# Patient Record
Sex: Female | Born: 1980
Health system: Southern US, Community
[De-identification: ages and names within clinical notes are randomized; demographics above are authoritative.]

## PROBLEM LIST (undated history)

## (undated) DIAGNOSIS — C8400 Mycosis fungoides, unspecified site: Secondary | ICD-10-CM

## (undated) DIAGNOSIS — M069 Rheumatoid arthritis, unspecified: Secondary | ICD-10-CM

---

## 2016-04-05 ENCOUNTER — Encounter: Payer: Self-pay | Admitting: Family Medicine

## 2016-04-06 ENCOUNTER — Ambulatory Visit (INDEPENDENT_AMBULATORY_CARE_PROVIDER_SITE_OTHER): Payer: Self-pay | Admitting: Physician Assistant

## 2016-04-06 VITALS — BP 122/72 | HR 67 | Temp 97.4°F | Resp 17 | Ht 67.0 in | Wt 176.0 lb

## 2016-04-06 DIAGNOSIS — Z111 Encounter for screening for respiratory tuberculosis: Secondary | ICD-10-CM

## 2016-04-06 NOTE — Progress Notes (Signed)
  Tuberculosis Risk Questionnaire  1. Yes [place] Were you born outside the [place] in one of the following parts of the world: [place], [place], [place], [place] or [place]?    2. Yes [place] Have you traveled outside the [place] and lived for more than one month in one of the following parts of the world: [place], [place], [place], [place] or [place]?    3. No Do you have a compromised immune system such as from any of the following conditions:HIV/AIDS, organ or bone marrow transplantation, diabetes, immunosuppressive medicines (e.g. Prednisone, Remicaide), leukemia, lymphoma, cancer of the head or neck, gastrectomy or jejunal bypass, end-stage renal disease (on dialysis), or silicosis?     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    5. No Have you ever: injected illegal drugs, used crack cocaine, lived in a homeless shelter  or been in jail or prison?     6. Yes [place] Have you ever been exposed to anyone with infectious tuberculosis?    Tuberculosis Symptom Questionnaire  Do you currently have any of the following symptoms?  1. No Unexplained cough lasting more than 3 weeks?   2. No Unexplained fever lasting more than 3 weeks.   3. No Night Sweats (sweating that leaves the bedclothes and sheets wet)     4. No Shortness of Breath   5. No Chest Pain   6. No Unintentional weight loss    7. No Unexplained fatigue (very tired for no reason)

## 2016-04-06 NOTE — Patient Instructions (Signed)
     IF you received an x-ray today, you will receive an invoice from [facility]. Please contact [facility] at [phone] with questions or concerns regarding your invoice.   IF you received labwork today, you will receive an invoice from [facility]. Please contact [facility] at [phone] with questions or concerns regarding your invoice.   Our billing staff will not be able to assist you with questions regarding bills from these companies.  You will be contacted with the lab results as soon as they are available. The fastest way to get your results is to activate your My Chart account. Instructions are located on the last page of this paperwork. If you have not heard from us regarding the results in 2 weeks, please contact this office.     

## 2016-04-10 LAB — QUANTIFERON IN TUBE
QFT TB AG MINUS NIL VALUE: 0 IU/mL
QUANTIFERON MITOGEN VALUE: 7.11 [IU]/mL
QUANTIFERON NIL VALUE: 0.03 [IU]/mL
QUANTIFERON TB AG VALUE: 0.03 [IU]/mL
QUANTIFERON TB GOLD: NEGATIVE

## 2016-04-10 LAB — QUANTIFERON TB GOLD ASSAY (BLOOD)

## 2016-04-12 ENCOUNTER — Telehealth: Payer: Self-pay | Admitting: Physician Assistant

## 2016-04-12 ENCOUNTER — Emergency Department (HOSPITAL_COMMUNITY)
Admission: EM | Admit: 2016-04-12 | Discharge: 2016-04-13 | Disposition: A | Discharge status: Home / Self Care | Payer: Self-pay | Attending: Emergency Medicine | Admitting: Emergency Medicine

## 2016-04-12 DIAGNOSIS — H8110 Benign paroxysmal vertigo, unspecified ear: Secondary | ICD-10-CM | POA: Insufficient documentation

## 2016-04-12 LAB — CBC WITH DIFFERENTIAL/PLATELET
BASOS ABS: 0 10*3/uL (ref 0.0–0.1)
Basophils Relative: 0 %
EOS ABS: 0.1 10*3/uL (ref 0.0–0.7)
EOS PCT: 2 %
HCT: 36.4 % (ref 36.0–46.0)
Hemoglobin: 11.7 g/dL — ABNORMAL LOW (ref 12.0–15.0)
LYMPHS ABS: 1.7 10*3/uL (ref 0.7–4.0)
LYMPHS PCT: 38 %
MCH: 25.4 pg — AB (ref 26.0–34.0)
MCHC: 32.1 g/dL (ref 30.0–36.0)
MCV: 79.1 fL (ref 78.0–100.0)
MONO ABS: 0.4 10*3/uL (ref 0.1–1.0)
Monocytes Relative: 8 %
Neutro Abs: 2.4 10*3/uL (ref 1.7–7.7)
Neutrophils Relative %: 52 %
PLATELETS: 358 10*3/uL (ref 150–400)
RBC: 4.6 MIL/uL (ref 3.87–5.11)
RDW: 15.5 % (ref 11.5–15.5)
WBC: 4.6 10*3/uL (ref 4.0–10.5)

## 2016-04-12 LAB — COMPREHENSIVE METABOLIC PANEL
ALT: 15 U/L (ref 14–54)
AST: 22 U/L (ref 15–41)
Albumin: 4.3 g/dL (ref 3.5–5.0)
Alkaline Phosphatase: 49 U/L (ref 38–126)
Anion gap: 8 (ref 5–15)
BUN: 7 mg/dL (ref 6–20)
CHLORIDE: 108 mmol/L (ref 101–111)
CO2: 22 mmol/L (ref 22–32)
Calcium: 9.7 mg/dL (ref 8.9–10.3)
Creatinine, Ser: 0.55 mg/dL (ref 0.44–1.00)
Glucose, Bld: 88 mg/dL (ref 65–99)
POTASSIUM: 3.9 mmol/L (ref 3.5–5.1)
SODIUM: 138 mmol/L (ref 135–145)
Total Bilirubin: 0.4 mg/dL (ref 0.3–1.2)
Total Protein: 7.6 g/dL (ref 6.5–8.1)

## 2016-04-12 LAB — POC URINE PREG, ED: PREG TEST UR: NEGATIVE

## 2016-04-12 LAB — URINALYSIS, ROUTINE W REFLEX MICROSCOPIC
Bilirubin Urine: NEGATIVE
Glucose, UA: NEGATIVE mg/dL
Hgb urine dipstick: NEGATIVE
Ketones, ur: NEGATIVE mg/dL
LEUKOCYTES UA: NEGATIVE
NITRITE: NEGATIVE
Protein, ur: NEGATIVE mg/dL
SPECIFIC GRAVITY, URINE: 1.004 — AB (ref 1.005–1.030)
pH: 7 (ref 5.0–8.0)

## 2016-04-12 NOTE — Telephone Encounter (Signed)
Mychart message sent regarding results

## 2016-04-12 NOTE — ED Notes (Signed)
No answer in waiting area x 1

## 2016-04-12 NOTE — ED Triage Notes (Signed)
Pt c/o intermittent dizziness onset x 1 mth, pt reports unsteady gait, pt of headache, pt c/o bitterness in her mouth, reports no appetite, denies n/v/d, pt c/o some blurred vision when she feels dizzy, pt moved from [place] in January, pt A&O x4

## 2016-04-12 NOTE — Progress Notes (Signed)
PRIMARY CARE AT [place], [place] [address] [phone]  Date:  04/06/2016   Name:  Erin Clay   DOB:  1980/12/23   MRN:  [id number]  PCP:  No primary care provider on file.    History of Present Illness:  Erin Clay is a 36 y.o. female patient who presents to PCP with  Chief Complaint  Patient presents with  . 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  She must have a tb test.  Past bcg vaccination.     There are no active problems to display for this patient.   No past medical history on file.  No past surgical history on file.  Social History  Substance Use Topics  . Smoking status: Never Smoker  . Smokeless tobacco: Never Used  . Alcohol use No    No family history on file.  Not on File  Medication list has been reviewed and updated.  No current outpatient prescriptions on file prior to visit.   No current facility-administered medications on file prior to visit.     ROS ROS otherwise unremarkable unless listed above.  Physical Examination: BP 122/72   Pulse 67   Temp 97.4 F (36.3 C) (Oral)   Resp 17   Ht 5\' 7"  (1.702 m)   Wt 176 lb (79.8 kg)   LMP 03/26/2016 (Approximate)   SpO2 97%   BMI 27.57 kg/m  Ideal Body Weight: Weight in (lb) to have BMI = 25: 159.3  Physical Exam  Constitutional: She is oriented to person, place, and time. She appears well-developed and well-nourished. No distress.  HENT:  Head: Normocephalic and atraumatic.  Right Ear: External ear normal.  Left Ear: External ear normal.  Eyes: Conjunctivae and EOM are normal. Pupils are equal, round, and reactive to light.  Cardiovascular: Normal rate.   Pulmonary/Chest: Effort normal. No respiratory distress.  Neurological: She is alert and oriented to person, place, and time.  Skin: She is not diaphoretic.  Psychiatric: She has a normal mood and affect. Her behavior is normal.   Questionnaire  form attached.  Assessment and Plan: Erin Clay is a 36 y.o. female who is here today for tb test.   quantiferon obtianed today. Follow up pending result 1. Screening for tuberculosis - Quantiferon tb gold assay (blood)   Ivar Drape, PA-C [facility] 04/12/2016 9:48 AM

## 2016-04-13 ENCOUNTER — Encounter: Payer: Self-pay | Admitting: Physician Assistant

## 2016-04-13 MED ORDER — MECLIZINE HCL 32 MG PO TABS: 32.0000 mg | ORAL_TABLET | Freq: Three times a day (TID) | ORAL | 0 refills | Status: DC | PRN

## 2016-04-13 MED ORDER — MECLIZINE HCL 25 MG PO TABS
12.5000 mg | ORAL_TABLET | Freq: Once | ORAL | Status: AC
  Administered 2016-04-13: 12.5 mg via ORAL
  Filled 2016-04-13: qty 1

## 2016-04-13 NOTE — Discharge Instructions (Addendum)
Take medication as prescribed.   Make sure to stay hydrated and drink plenty of fluids.   Return to the ER if you have any worsening dizziness, persistent vomiting, changes in vision or headache that will not go away.

## 2016-04-13 NOTE — ED Notes (Signed)
Pt was found sleeping in waiting area.  Updated that she will be next to go back.

## 2016-04-13 NOTE — ED Notes (Signed)
Pt discharged by Gillermo Murdoch understanding of discharge instructions. NAD noted, abc intact. Understands medication and follow up instructions.

## 2016-04-13 NOTE — ED Provider Notes (Signed)
[facility] DEPT Provider Note   CSN: [id number] Arrival date & time: 04/12/16  1755     History   Chief Complaint Chief Complaint  Patient presents with  . Dizziness    HPI Erin Clay is a 36 y.o. female with who presents with 1 month of progressively worsening dizziness. She describes the dizziness as a room spinning sensation and states that it is worsened by positional movement and walking around. Her symptoms are improved when she rests and lays still. She has some intermittent nausea, headache and blurry vision associated with episodes of dizziness that are easily resolved when dizziness subsides. She has not tried any medications for symptoms. She denies recent falls or traumas. She does not smoke any cigarettes and denies any illicit drug use.   The history is provided by the patient.      No past medical history on file.  There are no active problems to display for this patient.   No past surgical history on file.  OB History    No data available       Home Medications    Prior to Admission medications   Medication Sig Start Date End Date Taking? Authorizing Provider  Multiple Vitamins-Minerals (MULTIVITAMIN WITH MINERALS) tablet Take 1 tablet by mouth daily.   Yes Historical Provider, MD  Omega-3 Fatty Acids (FISH OIL) 1000 MG CAPS Take 1 capsule by mouth.    Yes Historical Provider, MD  meclizine (ANTIVERT) 32 MG tablet Take 1 tablet (32 mg total) by mouth 3 (three) times daily as needed. 04/13/16   Orson Aloe, PA    Family History No family history on file.  Social History Social History  Substance Use Topics  . Smoking status: Never Smoker  . Smokeless tobacco: Never Used  . Alcohol use No     Allergies   Patient has no known allergies.   Review of Systems Review of Systems  Constitutional: Negative for appetite change, chills and fever.  HENT: Negative for congestion.   Eyes: Positive for visual disturbance.  Respiratory:  Negative for cough and shortness of breath.   Cardiovascular: Negative for chest pain and leg swelling.  Gastrointestinal: Positive for nausea. Negative for abdominal pain, diarrhea and vomiting.  Genitourinary: Positive for hematuria. Negative for dysuria.  Musculoskeletal: Negative for myalgias.  Neurological: Positive for dizziness and headaches. Negative for syncope and weakness.  All other systems reviewed and are negative.    Physical Exam Updated Vital Signs BP 118/68   Pulse 76   Temp 98.4 F (36.9 C) (Oral)   Resp (!) 22   Ht 5\' 7"  (1.702 m)   LMP 03/26/2016 (Approximate)   SpO2 100%   Physical Exam  Constitutional: She is oriented to person, place, and time. She appears well-developed and well-nourished.  HENT:  Head: Normocephalic and atraumatic.  Mouth/Throat: Oropharynx is clear and moist and mucous membranes are normal.  Eyes: Conjunctivae, EOM and lids are normal. Pupils are equal, round, and reactive to light.  Neck: Full passive range of motion without pain.  Cardiovascular: Normal rate, regular rhythm, normal heart sounds and normal pulses.  Exam reveals no gallop and no friction rub.   No murmur heard. Pulmonary/Chest: Effort normal and breath sounds normal.  Abdominal: Soft. Normal appearance. There is no tenderness. There is no rigidity and no guarding.  Musculoskeletal: Normal range of motion.  Neurological: She is alert and oriented to person, place, and time.  Cranial nerves III-XII intact Follows commands, Moves all extremities  5/5  strength to BUE and BLE  Sensation intact throughout  Normal finger to nose. No dysdiadochokinesia. No pronator drift. No slurred speech. No facial droop.   Skin: Skin is warm and dry. Capillary refill takes less than 2 seconds.  Psychiatric: She has a normal mood and affect. Her speech is normal.  Nursing note and vitals reviewed.    ED Treatments / Results  Labs (all labs ordered are listed, but only abnormal  results are displayed) Labs Reviewed  CBC WITH DIFFERENTIAL/PLATELET - Abnormal; Notable for the following:       Result Value   Hemoglobin 11.7 (*)    MCH 25.4 (*)    All other components within normal limits  URINALYSIS, ROUTINE W REFLEX MICROSCOPIC - Abnormal; Notable for the following:    Color, Urine STRAW (*)    Specific Gravity, Urine 1.004 (*)    All other components within normal limits  COMPREHENSIVE METABOLIC PANEL  POC URINE PREG, ED    EKG  EKG Interpretation None       Radiology No results found.  Procedures Procedures (including critical care time)  Medications Ordered in ED Medications  meclizine (ANTIVERT) tablet 12.5 mg (12.5 mg Oral Given 04/13/16 0119)     Initial Impression / Assessment and Plan / ED Course  I have reviewed the triage vital signs and the nursing notes.  Pertinent labs & imaging results that were available during my care of the patient were reviewed by me and considered in my medical decision making (see chart for details).     Consider BPPV vs anemia vs orthostatic hypotension vs electrolyte abnormality, though given history of symptoms, high suspicion for BPPV. Low suspicion for cranial pathology given lack of neural deficits on exam. Plan to check UA, Urine hcg, CMP, CBC. BP stable at this time. Patient offered anti-emetic but declined. Will give 25 mg meclizine and re-eval.   1:39 AM: Patient given meclizine. Denies any current dizziness or nausea. Will plan to re-check patient and have her ambulate in the ED. Will check orthostatics.   3:10 AM: Patient ambulated in the department without difficulty. Orthostatics within normal limits. Patient reports feeling improved. Will discharge home with meclizine for symptomatic relief. Discussed discharge plans with patient. She does not have any PCP follow-up so will provide her with numbers for clinic follow-up. Advised patient to return to the ED for any worsening dizziness, persistent  nausea/vomiting, loss of consciousness, vision changes or any other worsening or concerning symptoms. Patient expressed understanding and agreement to plan.     Final Clinical Impressions(s) / ED Diagnoses   Final diagnoses:  Benign paroxysmal positional vertigo, unspecified laterality    New Prescriptions Discharge Medication List as of 04/13/2016  3:11 AM    START taking these medications   Details  meclizine (ANTIVERT) 32 MG tablet Take 1 tablet (32 mg total) by mouth 3 (three) times daily as needed., Starting Sat 04/13/2016, Print         [place], [place] 97/67/34 1937    Delora Fuel, MD 90/24/09 [id number]

## 2016-04-13 NOTE — ED Notes (Signed)
Pt ambulated in hallway without difficulty

## 2017-09-25 ENCOUNTER — Other Ambulatory Visit: Payer: Self-pay

## 2017-09-25 ENCOUNTER — Emergency Department (HOSPITAL_COMMUNITY): Payer: No Typology Code available for payment source

## 2017-09-25 ENCOUNTER — Emergency Department (HOSPITAL_COMMUNITY)
Admission: EM | Admit: 2017-09-25 | Discharge: 2017-09-26 | Disposition: A | Discharge status: Home / Self Care | Payer: No Typology Code available for payment source | Attending: Emergency Medicine | Admitting: Emergency Medicine

## 2017-09-25 ENCOUNTER — Encounter (HOSPITAL_COMMUNITY): Payer: Self-pay | Admitting: Emergency Medicine

## 2017-09-25 DIAGNOSIS — M25531 Pain in right wrist: Secondary | ICD-10-CM | POA: Diagnosis not present

## 2017-09-25 DIAGNOSIS — Z79899 Other long term (current) drug therapy: Secondary | ICD-10-CM | POA: Diagnosis not present

## 2017-09-25 MED ORDER — IBUPROFEN 800 MG PO TABS
800.0000 mg | ORAL_TABLET | Freq: Once | ORAL | Status: AC
  Administered 2017-09-25: 800 mg via ORAL
  Filled 2017-09-25: qty 1

## 2017-09-25 NOTE — ED Triage Notes (Signed)
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  Pt is able to move it but w/ pain.

## 2017-09-25 NOTE — ED Provider Notes (Signed)
[facility] EMERGENCY DEPARTMENT Provider Note   CSN: [id number] Arrival date & time: 09/25/17  2049     History   Chief Complaint Chief Complaint  Patient presents with  . Arm Pain    HPI Erin Clay is a 37 y.o. female with a hx of no major medical problems presents to the Emergency Department complaining of acute, persistent right hand pain onset around 7 PM.  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  She reports this pain radiated to her right middle finger and up to her right elbow.  She reports she is been able to move her hand and wrist but this does elicit pain.  No treatments prior to arrival.  She denies numbness, tingling or weakness.  She denies previous injury to the hand or wrist.  She denies lacerations or open wounds.  Movement and palpation make her symptoms worse.  Nothing seems to make them better.   The history is provided by the patient and medical records. No language interpreter was used.    History reviewed. No pertinent past medical history.  There are no active problems to display for this patient.   History reviewed. No pertinent surgical history.   OB History   None      Home Medications    Prior to Admission medications   Medication Sig Start Date End Date Taking? Authorizing Provider  meclizine (ANTIVERT) 32 MG tablet Take 1 tablet (32 mg total) by mouth 3 (three) times daily as needed. 04/13/16   Volanda Napoleon, PA-C  Multiple Vitamins-Minerals (MULTIVITAMIN WITH MINERALS) tablet Take 1 tablet by mouth daily.    [provider]  Omega-3 Fatty Acids (FISH OIL) 1000 MG CAPS Take 1 capsule by mouth.     [provider]    Family History No family history on file.  Social History Social History   Tobacco Use  . Smoking status: Never Smoker  . Smokeless tobacco: Never Used  Substance Use Topics  . Alcohol use: No    . Drug use: No     Allergies   Patient has no known allergies.   Review of Systems Review of Systems  Constitutional: Negative for chills and fever.  Gastrointestinal: Negative for nausea and vomiting.  Musculoskeletal: Positive for arthralgias. Negative for back pain, joint swelling, neck pain and neck stiffness.  Skin: Negative for wound.  Neurological: Negative for numbness.  Hematological: Does not bruise/bleed easily.  Psychiatric/Behavioral: The patient is not nervous/anxious.   All other systems reviewed and are negative.    Physical Exam Updated Vital Signs BP 122/78 (BP Location: Right Arm)   Pulse 72   Temp 98.4 F (36.9 C) (Oral)   Resp 18   Ht 5\' 7"  (1.702 m)   Wt 83.5 kg   SpO2 100%   BMI 28.82 kg/m   Physical Exam  Constitutional: She appears well-developed and well-nourished. No distress.  HENT:  Head: Normocephalic and atraumatic.  Eyes: Conjunctivae are normal.  Neck: Normal range of motion.  Cardiovascular: Normal rate, regular rhythm and intact distal pulses.  Capillary refill < 3 sec  Pulmonary/Chest: Effort normal and breath sounds normal.  Musculoskeletal: She exhibits tenderness. She exhibits no edema.  Full range of motion of the right shoulder, elbow, wrist and all fingers of the right hand.  Tenderness to palpation over the dorsum of the hand and palmar side of her wrist.  No palpable deformity.  No open wounds.  Neurological: She is alert. Coordination normal.  Sensation intact to normal touch in the right upper extremity Strength 5/5 including flexion extension of all fingers, wrist and elbow.  5/5 supination and pronation  Skin: Skin is warm and dry. She is not diaphoretic.  No tenting of the skin  Psychiatric: She has a normal mood and affect.  Nursing note and vitals reviewed.    ED Treatments / Results   Radiology Dg Wrist Complete Right  Result Date: 09/25/2017 CLINICAL DATA:  Pain.  Hand series today. EXAM: RIGHT WRIST -  COMPLETE 3+ VIEW COMPARISON:  None. FINDINGS: There is no evidence of fracture or dislocation. There is no evidence of arthropathy or other focal bone abnormality. Soft tissues are unremarkable. IMPRESSION: Negative. Electronically Signed   By: Rolm Baptise M.D.   On: 09/25/2017 23:59   Dg Hand Complete Right  Result Date: 09/25/2017 CLINICAL DATA:  Pain EXAM: RIGHT HAND - COMPLETE 3+ VIEW COMPARISON:  Wrist series today FINDINGS: There is no evidence of fracture or dislocation. There is no evidence of arthropathy or other focal bone abnormality. Soft tissues are unremarkable. IMPRESSION: Negative. Electronically Signed   By: Rolm Baptise M.D.   On: 09/25/2017 23:58    Procedures Procedures (including critical care time)  Medications Ordered in ED Medications  ibuprofen (ADVIL,MOTRIN) tablet 800 mg (800 mg Oral Given 09/25/17 2334)     Initial Impression / Assessment and Plan / ED Course  I have reviewed the triage vital signs and the nursing notes.  Pertinent labs & imaging results that were available during my care of the patient were reviewed by me and considered in my medical decision making (see chart for details).     Patient X-Ray negative for obvious fracture or dislocation.  I personally evaluated these images.  Pain managed in ED. Pt advised to follow up with orthopedics if symptoms persist for possibility of missed fracture diagnosis. No specific tenderness over the scaphoid, but discussed with pt that occult fracture cannot be ruled out and the importance of close follow-up if pain persists greater than 1 week or is not improving. Patient given brace while in ED, conservative therapy recommended and discussed. Patient will be dc home & is agreeable with above plan.   Final Clinical Impressions(s) / ED Diagnoses   Final diagnoses:  Right wrist pain    ED Discharge Orders    None       Loris Winrow, Gwenlyn Perking 09/26/17 Cathe Mons, MD 09/26/17  [phone]

## 2017-09-26 NOTE — ED Notes (Signed)
No signaute pad available, RN reviewed discharge instructions w/ pt and family member.

## 2017-09-26 NOTE — Discharge Instructions (Addendum)
1. Medications: alternate naprosyn and tylenol for pain control, usual home medications 2. Treatment: rest, ice, elevate and use brace, drink plenty of fluids, gentle stretching 3. Follow Up: Please followup with orthopedics in 1 week if no improvement for discussion of your diagnoses and further evaluation after today's visit; if you do not have a primary care doctor use the resource guide provided to find one; Please return to the ER for worsening symptoms or other concerns

## 2018-03-09 ENCOUNTER — Emergency Department (HOSPITAL_COMMUNITY): Payer: BLUE CROSS/BLUE SHIELD

## 2018-03-09 ENCOUNTER — Emergency Department (HOSPITAL_COMMUNITY)
Admission: EM | Admit: 2018-03-09 | Discharge: 2018-03-09 | Disposition: A | Discharge status: Home / Self Care | Payer: BLUE CROSS/BLUE SHIELD | Attending: Emergency Medicine | Admitting: Emergency Medicine

## 2018-03-09 DIAGNOSIS — Z79899 Other long term (current) drug therapy: Secondary | ICD-10-CM | POA: Insufficient documentation

## 2018-03-09 DIAGNOSIS — R509 Fever, unspecified: Secondary | ICD-10-CM

## 2018-03-09 DIAGNOSIS — J111 Influenza due to unidentified influenza virus with other respiratory manifestations: Secondary | ICD-10-CM | POA: Diagnosis not present

## 2018-03-09 DIAGNOSIS — R945 Abnormal results of liver function studies: Secondary | ICD-10-CM | POA: Diagnosis not present

## 2018-03-09 DIAGNOSIS — R7989 Other specified abnormal findings of blood chemistry: Secondary | ICD-10-CM

## 2018-03-09 DIAGNOSIS — R69 Illness, unspecified: Secondary | ICD-10-CM

## 2018-03-09 LAB — URINALYSIS, ROUTINE W REFLEX MICROSCOPIC
BILIRUBIN URINE: NEGATIVE
Glucose, UA: NEGATIVE mg/dL
Hgb urine dipstick: NEGATIVE
Ketones, ur: 5 mg/dL — AB
Leukocytes, UA: NEGATIVE
Nitrite: NEGATIVE
Protein, ur: NEGATIVE mg/dL
Specific Gravity, Urine: 1.016 (ref 1.005–1.030)
pH: 5 (ref 5.0–8.0)

## 2018-03-09 LAB — CBC WITH DIFFERENTIAL/PLATELET
ABS IMMATURE GRANULOCYTES: 0.13 10*3/uL — AB (ref 0.00–0.07)
BASOS ABS: 0.1 10*3/uL (ref 0.0–0.1)
BASOS PCT: 1 %
EOS ABS: 0.3 10*3/uL (ref 0.0–0.5)
EOS PCT: 3 %
HCT: 36.5 % (ref 36.0–46.0)
Hemoglobin: 11.2 g/dL — ABNORMAL LOW (ref 12.0–15.0)
Immature Granulocytes: 2 %
Lymphocytes Relative: 40 %
Lymphs Abs: 3.5 10*3/uL (ref 0.7–4.0)
MCH: 25.2 pg — AB (ref 26.0–34.0)
MCHC: 30.7 g/dL (ref 30.0–36.0)
MCV: 82 fL (ref 80.0–100.0)
MONO ABS: 0.4 10*3/uL (ref 0.1–1.0)
Monocytes Relative: 5 %
NRBC: 0 % (ref 0.0–0.2)
Neutro Abs: 4.2 10*3/uL (ref 1.7–7.7)
Neutrophils Relative %: 49 %
PLATELETS: 239 10*3/uL (ref 150–400)
RBC: 4.45 MIL/uL (ref 3.87–5.11)
RDW: 14.6 % (ref 11.5–15.5)
WBC Morphology: >10
WBC: 8.6 10*3/uL (ref 4.0–10.5)

## 2018-03-09 LAB — COMPREHENSIVE METABOLIC PANEL
ALT: 116 U/L — AB (ref 0–44)
AST: 80 U/L — AB (ref 15–41)
Albumin: 3.4 g/dL — ABNORMAL LOW (ref 3.5–5.0)
Alkaline Phosphatase: 89 U/L (ref 38–126)
Anion gap: 15 (ref 5–15)
BILIRUBIN TOTAL: 1 mg/dL (ref 0.3–1.2)
CALCIUM: 8.4 mg/dL — AB (ref 8.9–10.3)
CHLORIDE: 99 mmol/L (ref 98–111)
CO2: 19 mmol/L — ABNORMAL LOW (ref 22–32)
CREATININE: 0.84 mg/dL (ref 0.44–1.00)
Glucose, Bld: 96 mg/dL (ref 70–99)
Potassium: 3.3 mmol/L — ABNORMAL LOW (ref 3.5–5.1)
Sodium: 133 mmol/L — ABNORMAL LOW (ref 135–145)
TOTAL PROTEIN: 7 g/dL (ref 6.5–8.1)

## 2018-03-09 LAB — INFLUENZA PANEL BY PCR (TYPE A & B)
INFLBPCR: NEGATIVE
Influenza A By PCR: NEGATIVE

## 2018-03-09 LAB — MONONUCLEOSIS SCREEN: Mono Screen: NEGATIVE

## 2018-03-09 LAB — GROUP A STREP BY PCR: Group A Strep by PCR: NOT DETECTED

## 2018-03-09 LAB — PREGNANCY, URINE: Preg Test, Ur: NEGATIVE

## 2018-03-09 LAB — LACTIC ACID, PLASMA: Lactic Acid, Venous: 1.6 mmol/L (ref 0.5–1.9)

## 2018-03-09 MED ORDER — SODIUM CHLORIDE 0.9 % IV BOLUS (SEPSIS)
1000.0000 mL | Freq: Once | INTRAVENOUS | Status: AC
  Administered 2018-03-09: 1000 mL via INTRAVENOUS

## 2018-03-09 MED ORDER — SODIUM CHLORIDE 0.9 % IV BOLUS (SEPSIS)
500.0000 mL | Freq: Once | INTRAVENOUS | Status: AC
  Administered 2018-03-09: 500 mL via INTRAVENOUS

## 2018-03-09 MED ORDER — SODIUM CHLORIDE 0.9 % IV BOLUS (SEPSIS)
1000.0000 mL | Freq: Once | INTRAVENOUS | Status: AC
  Administered 2018-03-09 (×2): 1000 mL via INTRAVENOUS

## 2018-03-09 MED ORDER — AMOXICILLIN-POT CLAVULANATE 875-125 MG PO TABS: 1.0000 | ORAL_TABLET | Freq: Two times a day (BID) | ORAL | 0 refills | Status: DC

## 2018-03-09 MED ORDER — ACETAMINOPHEN 325 MG PO TABS
650.0000 mg | ORAL_TABLET | Freq: Once | ORAL | Status: AC | PRN
  Administered 2018-03-09: 650 mg via ORAL
  Filled 2018-03-09: qty 2

## 2018-03-09 MED ORDER — DIPHENHYDRAMINE HCL 50 MG/ML IJ SOLN
12.5000 mg | Freq: Once | INTRAMUSCULAR | Status: AC
  Administered 2018-03-09: 12.5 mg via INTRAVENOUS
  Filled 2018-03-09: qty 1

## 2018-03-09 MED ORDER — IBUPROFEN 400 MG PO TABS
600.0000 mg | ORAL_TABLET | Freq: Once | ORAL | Status: AC
  Administered 2018-03-09: 600 mg via ORAL
  Filled 2018-03-09: qty 1

## 2018-03-09 NOTE — ED Notes (Signed)
Pt. returned from XR. 

## 2018-03-09 NOTE — ED Provider Notes (Signed)
[facility] EMERGENCY DEPARTMENT Provider Note   CSN: [id number] Arrival date & time: 03/09/18  8251     History   Chief Complaint Chief Complaint  Patient presents with  . Fever  . Lymphadenopathy    HPI Erin Clay is a 38 y.o. female.  She is complaining of feeling sick for 1 week.  She feels that her face is swollen and she has tender lymph nodes on her neck.  She has had a lack of appetite.  She is felt hot and cold.  She said she went to urgent care and they did not find anything.  Today in triage she was febrile to 103.  No sore throat no cough no earache no urinary symptoms no vaginal bleeding or discharge no diarrhea.  She was last in [place] 4 months ago.  No sick contacts or recent travel.  The history is provided by the patient.  Fever  Max temp prior to arrival:  103 Temp source:  Oral Severity:  Severe Onset quality:  Gradual Duration:  1 week Timing:  Intermittent Progression:  Unchanged Chronicity:  New Relieved by:  None tried Worsened by:  Nothing Ineffective treatments:  Acetaminophen Associated symptoms: chills and nausea   Associated symptoms: no chest pain, no confusion, no congestion, no cough, no diarrhea, no dysuria, no ear pain, no rash, no rhinorrhea, no sore throat and no vomiting   Risk factors: immunosuppression   Risk factors: no recent travel and no sick contacts     No past medical history on file.  There are no active problems to display for this patient.   No past surgical history on file.   OB History   No obstetric history on file.      Home Medications    Prior to Admission medications   Medication Sig Start Date End Date Taking? Authorizing Provider  meclizine (ANTIVERT) 32 MG tablet Take 1 tablet (32 mg total) by mouth 3 (three) times daily as needed. 04/13/16   Volanda Napoleon, PA-C  Multiple Vitamins-Minerals (MULTIVITAMIN WITH MINERALS) tablet Take 1 tablet by mouth daily.    [provider]  Omega-3 Fatty Acids (FISH OIL) 1000 MG CAPS Take 1 capsule by mouth.     [provider]    Family History No family history on file.  Social History Social History   Tobacco Use  . Smoking status: Never Smoker  . Smokeless tobacco: Never Used  Substance Use Topics  . Alcohol use: No  . Drug use: No     Allergies   Patient has no known allergies.   Review of Systems Review of Systems  Constitutional: Positive for appetite change, chills, fatigue and fever.  HENT: Negative for congestion, ear pain, rhinorrhea and sore throat.   Eyes: Negative for redness and visual disturbance.  Respiratory: Negative for cough.   Cardiovascular: Negative for chest pain.  Gastrointestinal: Positive for nausea. Negative for abdominal pain, diarrhea and vomiting.  Genitourinary: Negative for dysuria.  Musculoskeletal: Negative for back pain.  Skin: Negative for rash.  Neurological: Positive for dizziness and light-headedness.  Psychiatric/Behavioral: Negative for confusion.     Physical Exam Updated Vital Signs BP 98/83 (BP Location: Right Arm)   Pulse (!) 115   Temp (!) 103 F (39.4 C) (Oral)   Resp 16   SpO2 99%   Physical Exam Vitals signs and nursing note reviewed.  Constitutional:      General: She is not in acute distress.  Appearance: She is well-developed.  HENT:     Head: Normocephalic and atraumatic.     Right Ear: Tympanic membrane normal.     Left Ear: Tympanic membrane normal.     Mouth/Throat:     Mouth: Mucous membranes are moist.  Eyes:     General: No scleral icterus.    Conjunctiva/sclera: Conjunctivae normal.  Neck:     Musculoskeletal: Neck supple. No neck rigidity.  Cardiovascular:     Rate and Rhythm: Regular rhythm. Tachycardia present.     Heart sounds: No murmur.  Pulmonary:     Effort: Pulmonary effort is normal. No respiratory distress.     Breath sounds: Normal breath sounds.  Abdominal:     Palpations: Abdomen is soft.      Tenderness: There is no abdominal tenderness.  Musculoskeletal: Normal range of motion.        General: No tenderness or signs of injury.     Right lower leg: No edema.     Left lower leg: No edema.  Lymphadenopathy:     Cervical: Cervical adenopathy present.  Skin:    General: Skin is warm and dry.     Capillary Refill: Capillary refill takes less than 2 seconds.  Neurological:     General: No focal deficit present.     Mental Status: She is alert.      ED Treatments / Results  Labs (all labs ordered are listed, but only abnormal results are displayed) Labs Reviewed  COMPREHENSIVE METABOLIC PANEL - Abnormal; Notable for the following components:      Result Value   Sodium 133 (*)    Potassium 3.3 (*)    CO2 19 (*)    BUN <5 (*)    Calcium 8.4 (*)    Albumin 3.4 (*)    AST 80 (*)    ALT 116 (*)    All other components within normal limits  CBC WITH DIFFERENTIAL/PLATELET - Abnormal; Notable for the following components:   Hemoglobin 11.2 (*)    MCH 25.2 (*)    Abs Immature Granulocytes 0.13 (*)    All other components within normal limits  URINALYSIS, ROUTINE W REFLEX MICROSCOPIC - Abnormal; Notable for the following components:   Color, Urine AMBER (*)    APPearance CLOUDY (*)    Ketones, ur 5 (*)    All other components within normal limits  GROUP A STREP BY PCR  CULTURE, BLOOD (ROUTINE X 2)  CULTURE, BLOOD (ROUTINE X 2)  PREGNANCY, URINE  LACTIC ACID, PLASMA  INFLUENZA PANEL BY PCR (TYPE A & B)  MONONUCLEOSIS SCREEN  HEPATITIS PANEL, ACUTE    EKG None  Radiology Dg Chest 2 View  Result Date: 03/09/2018 CLINICAL DATA:  Fever EXAM: CHEST - 2 VIEW COMPARISON:  None. FINDINGS: Lungs are clear. Heart size and pulmonary vascularity are normal. No adenopathy. No bone lesions. IMPRESSION: No edema or consolidation. Electronically Signed   By: Lowella Grip III M.D.   On: 03/09/2018 14:02   US Abdomen Limited Ruq  Result Date: 03/09/2018 CLINICAL DATA:   Elevated LFTs EXAM: ULTRASOUND ABDOMEN LIMITED RIGHT UPPER QUADRANT COMPARISON:  None. FINDINGS: Gallbladder: There is some sludge in the gallbladder with no stones, wall thickening, or pericholecystic fluid. No Murphy's sign. Common bile duct: Diameter: 3.2 mm Liver: No focal lesion identified. Within normal limits in parenchymal echogenicity. Portal vein is patent on color Doppler imaging with normal direction of blood flow towards the liver. IMPRESSION: Mild sludge in the gallbladder without  wall thickening, pericholecystic fluid, or Murphy's sign. No other abnormalities. Electronically Signed   By: Dorise Bullion III M.D   On: 03/09/2018 10:51    Procedures Procedures (including critical care time)  Medications Ordered in ED Medications  sodium chloride 0.9 % bolus 1,000 mL (has no administration in time range)    And  sodium chloride 0.9 % bolus 1,000 mL (has no administration in time range)    And  sodium chloride 0.9 % bolus 500 mL (has no administration in time range)  ibuprofen (ADVIL,MOTRIN) tablet 600 mg (has no administration in time range)  acetaminophen (TYLENOL) tablet 650 mg (650 mg Oral Given 03/09/18 0736)     Initial Impression / Assessment and Plan / ED Course  I have reviewed the triage vital signs and the nursing notes.  Pertinent labs & imaging results that were available during my care of the patient were reviewed by me and considered in my medical decision making (see chart for details).  Clinical Course as of Mar 10 1719  Mon Mar 09, 2018  1120 Canceled the code sepsis.  Normal lactate and white blood cell count.  She is got a mild bump in her AST and ALT but normal alk phos and bili.  Flu testing negative.  Right upper quadrant ultrasound shows some sludge but no signs of acute cholecystitis. Likely lft bump from fever or possible hepatitis, ordered hep panel.    [MB]  1143 Reevaluated patient.  She said she is feeling a little bit better and her tachycardia is  improving.  She is defervesced.   [MB]  1330 Patient clinically feeling better as tolerated p.o. has defervesced.  He has no abdominal pain.  She has a primary care doctor so I recommended that she follow-up with them soon for repeat LFTs.  She understands to return if any worsening symptoms.  I feel this is most likely viral but she does have some adenopathy so maybe cover her for an upper infection with some amoxicillin.  He has surveillance blood cultures pending.   [MB]    Clinical Course User Index [MB] Hayden Rasmussen, MD      Final Clinical Impressions(s) / ED Diagnoses   Final diagnoses:  LFT elevation  Fever, unspecified fever cause  Influenza-like illness    ED Discharge Orders         Ordered    amoxicillin-clavulanate (AUGMENTIN) 875-125 MG tablet  Every 12 hours     03/09/18 1332           Hayden Rasmussen, MD 03/09/18 1722

## 2018-03-09 NOTE — ED Notes (Signed)
Patient transported to X-ray 

## 2018-03-09 NOTE — Discharge Instructions (Addendum)
You were seen in the emergency department for high fever and not feeling well.  You had blood work urinalysis and an ultrasound that did not show an obvious explanation for your symptoms.  You improved with lowering your fever and giving you plenty of IV fluids.  You will need to follow-up with your doctor for recheck of your liver numbers along with seeing if there is any improvement in your symptoms.  We have called you in a prescription for an antibiotic for possible upper respiratory infection symptoms.  Please return if any worsening symptoms.

## 2018-03-09 NOTE — ED Triage Notes (Signed)
Pt arrives to ED with c.o of swollen lymph nodes in left with fever and chills for the last 1 week- pt reports no appetite.

## 2018-03-09 NOTE — ED Notes (Signed)
Back from US.

## 2018-03-09 NOTE — ED Notes (Signed)
To US

## 2018-03-09 NOTE — ED Notes (Signed)
Patient verbalizes understanding of discharge instructions. Opportunity for questioning and answers were provided. Armband removed by staff, pt discharged from ED. Pt ambulatory to lobby.  

## 2018-03-10 LAB — HEPATITIS PANEL, ACUTE
HCV Ab: <0.1 s/co ratio (ref 0.0–0.9)
HEP A IGM: NEGATIVE
Hep B C IgM: NEGATIVE
Hepatitis B Surface Ag: NEGATIVE

## 2018-03-12 ENCOUNTER — Emergency Department (HOSPITAL_COMMUNITY): Payer: BLUE CROSS/BLUE SHIELD

## 2018-03-12 ENCOUNTER — Other Ambulatory Visit: Payer: Self-pay

## 2018-03-12 ENCOUNTER — Inpatient Hospital Stay (HOSPITAL_COMMUNITY)
Admission: EM | Admit: 2018-03-12 | Discharge: 2018-03-19 | DRG: 854 | Disposition: A | Discharge status: Home / Self Care | Payer: BLUE CROSS/BLUE SHIELD | Attending: Family Medicine | Admitting: Family Medicine

## 2018-03-12 ENCOUNTER — Encounter (HOSPITAL_COMMUNITY): Payer: Self-pay | Admitting: Emergency Medicine

## 2018-03-12 DIAGNOSIS — M069 Rheumatoid arthritis, unspecified: Secondary | ICD-10-CM | POA: Diagnosis present

## 2018-03-12 DIAGNOSIS — R590 Localized enlarged lymph nodes: Secondary | ICD-10-CM | POA: Diagnosis present

## 2018-03-12 DIAGNOSIS — Z791 Long term (current) use of non-steroidal anti-inflammatories (NSAID): Secondary | ICD-10-CM

## 2018-03-12 DIAGNOSIS — F419 Anxiety disorder, unspecified: Secondary | ICD-10-CM | POA: Diagnosis present

## 2018-03-12 DIAGNOSIS — J9811 Atelectasis: Secondary | ICD-10-CM | POA: Diagnosis present

## 2018-03-12 DIAGNOSIS — D649 Anemia, unspecified: Secondary | ICD-10-CM | POA: Diagnosis present

## 2018-03-12 DIAGNOSIS — R6510 Systemic inflammatory response syndrome (SIRS) of non-infectious origin without acute organ dysfunction: Secondary | ICD-10-CM | POA: Diagnosis present

## 2018-03-12 DIAGNOSIS — R000 Tachycardia, unspecified: Secondary | ICD-10-CM | POA: Diagnosis present

## 2018-03-12 DIAGNOSIS — E876 Hypokalemia: Secondary | ICD-10-CM | POA: Diagnosis present

## 2018-03-12 DIAGNOSIS — B379 Candidiasis, unspecified: Secondary | ICD-10-CM | POA: Diagnosis present

## 2018-03-12 DIAGNOSIS — I959 Hypotension, unspecified: Secondary | ICD-10-CM | POA: Diagnosis present

## 2018-03-12 DIAGNOSIS — L299 Pruritus, unspecified: Secondary | ICD-10-CM | POA: Diagnosis present

## 2018-03-12 DIAGNOSIS — D638 Anemia in other chronic diseases classified elsewhere: Secondary | ICD-10-CM | POA: Diagnosis present

## 2018-03-12 DIAGNOSIS — R748 Abnormal levels of other serum enzymes: Secondary | ICD-10-CM | POA: Diagnosis present

## 2018-03-12 DIAGNOSIS — R740 Nonspecific elevation of levels of transaminase and lactic acid dehydrogenase [LDH]: Secondary | ICD-10-CM | POA: Diagnosis present

## 2018-03-12 DIAGNOSIS — Z79899 Other long term (current) drug therapy: Secondary | ICD-10-CM

## 2018-03-12 DIAGNOSIS — K828 Other specified diseases of gallbladder: Secondary | ICD-10-CM | POA: Diagnosis present

## 2018-03-12 DIAGNOSIS — E872 Acidosis, unspecified: Secondary | ICD-10-CM

## 2018-03-12 DIAGNOSIS — R509 Fever, unspecified: Principal | ICD-10-CM | POA: Diagnosis present

## 2018-03-12 DIAGNOSIS — R0902 Hypoxemia: Secondary | ICD-10-CM | POA: Diagnosis present

## 2018-03-12 DIAGNOSIS — Z7952 Long term (current) use of systemic steroids: Secondary | ICD-10-CM

## 2018-03-12 LAB — CBC WITH DIFFERENTIAL/PLATELET
Abs Immature Granulocytes: 0.17 10*3/uL — ABNORMAL HIGH (ref 0.00–0.07)
BASOS PCT: 1 %
Basophils Absolute: 0.1 10*3/uL (ref 0.0–0.1)
Eosinophils Absolute: 0.4 10*3/uL (ref 0.0–0.5)
Eosinophils Relative: 4 %
HCT: 36.9 % (ref 36.0–46.0)
Hemoglobin: 11.4 g/dL — ABNORMAL LOW (ref 12.0–15.0)
Immature Granulocytes: 2 %
Lymphocytes Relative: 40 %
Lymphs Abs: 4.4 10*3/uL — ABNORMAL HIGH (ref 0.7–4.0)
MCH: 26.3 pg (ref 26.0–34.0)
MCHC: 30.9 g/dL (ref 30.0–36.0)
MCV: 85 fL (ref 80.0–100.0)
Monocytes Absolute: 1.2 10*3/uL — ABNORMAL HIGH (ref 0.1–1.0)
Monocytes Relative: 11 %
Neutro Abs: 4.6 10*3/uL (ref 1.7–7.7)
Neutrophils Relative %: 42 %
Platelets: 245 10*3/uL (ref 150–400)
RBC: 4.34 MIL/uL (ref 3.87–5.11)
RDW: 15.6 % — AB (ref 11.5–15.5)
WBC: 10.9 10*3/uL — ABNORMAL HIGH (ref 4.0–10.5)
nRBC: 0.2 % (ref 0.0–0.2)

## 2018-03-12 LAB — URINALYSIS, ROUTINE W REFLEX MICROSCOPIC
BILIRUBIN URINE: NEGATIVE
Glucose, UA: NEGATIVE mg/dL
Hgb urine dipstick: NEGATIVE
Ketones, ur: 20 mg/dL — AB
Nitrite: NEGATIVE
Protein, ur: NEGATIVE mg/dL
Specific Gravity, Urine: 1.006 (ref 1.005–1.030)
pH: 6 (ref 5.0–8.0)

## 2018-03-12 LAB — COMPREHENSIVE METABOLIC PANEL
ALT: 226 U/L — ABNORMAL HIGH (ref 0–44)
AST: 157 U/L — ABNORMAL HIGH (ref 15–41)
Albumin: 3.4 g/dL — ABNORMAL LOW (ref 3.5–5.0)
Alkaline Phosphatase: 150 U/L — ABNORMAL HIGH (ref 38–126)
Anion gap: 10 (ref 5–15)
BUN: <5 mg/dL — ABNORMAL LOW (ref 6–20)
CO2: 23 mmol/L (ref 22–32)
Calcium: 8.5 mg/dL — ABNORMAL LOW (ref 8.9–10.3)
Chloride: 103 mmol/L (ref 98–111)
Creatinine, Ser: 0.6 mg/dL (ref 0.44–1.00)
GFR calc non Af Amer: >60 mL/min (ref >60)
Glucose, Bld: 90 mg/dL (ref 70–99)
Potassium: 2.9 mmol/L — ABNORMAL LOW (ref 3.5–5.1)
Sodium: 136 mmol/L (ref 135–145)
TOTAL PROTEIN: 6.8 g/dL (ref 6.5–8.1)
Total Bilirubin: 1 mg/dL (ref 0.3–1.2)

## 2018-03-12 LAB — CBG MONITORING, ED: Glucose-Capillary: 86 mg/dL (ref 70–99)

## 2018-03-12 LAB — C-REACTIVE PROTEIN: CRP: 7.6 mg/dL — ABNORMAL HIGH (ref <1.0)

## 2018-03-12 LAB — LACTIC ACID, PLASMA
Lactic Acid, Venous: 2 mmol/L (ref 0.5–1.9)
Lactic Acid, Venous: 2.1 mmol/L (ref 0.5–1.9)

## 2018-03-12 LAB — TSH: TSH: 1.964 u[IU]/mL (ref 0.350–4.500)

## 2018-03-12 LAB — SEDIMENTATION RATE: SED RATE: 12 mm/h (ref 0–22)

## 2018-03-12 LAB — I-STAT BETA HCG BLOOD, ED (MC, WL, AP ONLY): I-stat hCG, quantitative: <5 m[IU]/mL (ref <5)

## 2018-03-12 LAB — LACTATE DEHYDROGENASE: LDH: 702 U/L — ABNORMAL HIGH (ref 98–192)

## 2018-03-12 LAB — MAGNESIUM: Magnesium: 2.3 mg/dL (ref 1.7–2.4)

## 2018-03-12 MED ORDER — ACETAMINOPHEN 325 MG PO TABS
650.0000 mg | ORAL_TABLET | Freq: Four times a day (QID) | ORAL | Status: DC | PRN
  Administered 2018-03-14 – 2018-03-17 (×9): 650 mg via ORAL
  Filled 2018-03-12 (×10): qty 2

## 2018-03-12 MED ORDER — POTASSIUM CHLORIDE CRYS ER 20 MEQ PO TBCR
40.0000 meq | EXTENDED_RELEASE_TABLET | Freq: Once | ORAL | Status: AC
  Administered 2018-03-12: 40 meq via ORAL
  Filled 2018-03-12: qty 2

## 2018-03-12 MED ORDER — LACTATED RINGERS IV BOLUS
1000.0000 mL | Freq: Once | INTRAVENOUS | Status: AC
  Administered 2018-03-12: 1000 mL via INTRAVENOUS

## 2018-03-12 MED ORDER — SULFASALAZINE 500 MG PO TBEC
500.0000 mg | DELAYED_RELEASE_TABLET | Freq: Two times a day (BID) | ORAL | Status: DC
  Administered 2018-03-12 – 2018-03-19 (×14): 500 mg via ORAL
  Filled 2018-03-12 (×14): qty 1

## 2018-03-12 MED ORDER — LACTATED RINGERS IV BOLUS
500.0000 mL | Freq: Once | INTRAVENOUS | Status: AC
  Administered 2018-03-12: 500 mL via INTRAVENOUS

## 2018-03-12 MED ORDER — IOPAMIDOL (ISOVUE-370) INJECTION 76%
INTRAVENOUS | Status: AC
  Filled 2018-03-12: qty 100

## 2018-03-12 MED ORDER — POTASSIUM CHLORIDE 10 MEQ/100ML IV SOLN
10.0000 meq | Freq: Once | INTRAVENOUS | Status: AC
  Administered 2018-03-12: 10 meq via INTRAVENOUS
  Filled 2018-03-12: qty 100

## 2018-03-12 MED ORDER — ALPRAZOLAM 0.5 MG PO TABS
0.5000 mg | ORAL_TABLET | Freq: Once | ORAL | Status: AC
  Administered 2018-03-12: 0.5 mg via ORAL
  Filled 2018-03-12: qty 1

## 2018-03-12 MED ORDER — IBUPROFEN 200 MG PO TABS
400.0000 mg | ORAL_TABLET | Freq: Four times a day (QID) | ORAL | Status: DC | PRN
  Administered 2018-03-12: 400 mg via ORAL
  Filled 2018-03-12: qty 2

## 2018-03-12 MED ORDER — IOPAMIDOL (ISOVUE-370) INJECTION 76%
100.0000 mL | Freq: Once | INTRAVENOUS | Status: AC | PRN
  Administered 2018-03-12: 100 mL via INTRAVENOUS

## 2018-03-12 MED ORDER — KETOROLAC TROMETHAMINE 30 MG/ML IJ SOLN: 30.0000 mg | Freq: Once | INTRAMUSCULAR | Status: DC

## 2018-03-12 MED ORDER — ACETAMINOPHEN 650 MG RE SUPP: 650.0000 mg | Freq: Four times a day (QID) | RECTAL | Status: DC | PRN

## 2018-03-12 MED ORDER — LACTATED RINGERS IV SOLN
INTRAVENOUS | Status: DC
  Administered 2018-03-12 – 2018-03-14 (×2): via INTRAVENOUS

## 2018-03-12 MED ORDER — HYDROXYCHLOROQUINE SULFATE 200 MG PO TABS
400.0000 mg | ORAL_TABLET | Freq: Every day | ORAL | Status: DC
  Administered 2018-03-12 – 2018-03-19 (×8): 400 mg via ORAL
  Filled 2018-03-12 (×8): qty 2

## 2018-03-12 MED ORDER — SODIUM CHLORIDE (PF) 0.9 % IJ SOLN
INTRAMUSCULAR | Status: AC
  Administered 2018-03-12: 3 mL via INTRAVENOUS
  Filled 2018-03-12: qty 50

## 2018-03-12 MED ORDER — METHYLPREDNISOLONE SODIUM SUCC 125 MG IJ SOLR
60.0000 mg | Freq: Once | INTRAMUSCULAR | Status: AC
  Administered 2018-03-12: 60 mg via INTRAVENOUS
  Filled 2018-03-12: qty 2

## 2018-03-12 MED ORDER — KETOROLAC TROMETHAMINE 30 MG/ML IJ SOLN
30.0000 mg | Freq: Once | INTRAMUSCULAR | Status: AC | PRN
  Administered 2018-03-14: 30 mg via INTRAVENOUS
  Filled 2018-03-12: qty 1

## 2018-03-12 MED ORDER — IBUPROFEN 200 MG PO TABS
600.0000 mg | ORAL_TABLET | Freq: Once | ORAL | Status: AC
  Administered 2018-03-12: 600 mg via ORAL
  Filled 2018-03-12: qty 3

## 2018-03-12 MED ORDER — PREDNISONE 5 MG PO TABS
5.0000 mg | ORAL_TABLET | Freq: Every day | ORAL | Status: DC
  Administered 2018-03-12 – 2018-03-19 (×8): 5 mg via ORAL
  Filled 2018-03-12 (×8): qty 1

## 2018-03-12 MED ORDER — SODIUM CHLORIDE 0.9% FLUSH
3.0000 mL | Freq: Once | INTRAVENOUS | Status: AC
  Administered 2018-03-12 (×2): 3 mL via INTRAVENOUS

## 2018-03-12 NOTE — Significant Event (Addendum)
Rapid Response Event Note  Overview: Time Called: 2117 Arrival Time: 2120 Event Type: Respiratory  Initial Focused Assessment: Patient alert sitting up in bed. Patient RR 30-40 breaths per minute. HR 130 bpm. Oxygen saturation 99-100% on room air. BP 102/75. Pt complaining of difficulty breathing. Pt is unable to speak to me incomplete sentence, is interrupted by taking breaths and sounds short of breath when talking. Lung sounds clear, CT chest performed earlier today and was negative for pulmonary embolism. Overall, at rest patient does not appear distressed, but does begin to appear distressed in her breathing when she begins to talk. Patient also complaining of facial swelling, patient came in with this issue. Patient face does feel warm and appears swollen in her cheeks. Patient is able to cough and drink water. When asked to stick out her tongue, patient is able to do this and tongue does not appear swollen.  HR appears to be sinus tachycardia on the monitor with a rate of 130 bpm. Patient currently has a low grade fever of 100.34F. Patient is complaining of having chills. Ibuprofen given by primary RN.  Blood and urine cultures have been collected on this patient. Lactic acid 2.1.  Interventions: NP notified of facial swelling, oxygen saturation, respiratory rate, temperature, work of breathing, heart rate and lung sounds. Jeannette Corpus, NP feels that based off the patient's vital signs, her increased respiratory rate and heart rate could be related to anxiety since her oxygen saturation is maintaining on room air and her lungs sounds are clear. NP wrote for Xanax to be given now.  NP also wrote for additional steroids to be given at this time.   No additional tests ordered at this time.   Plan of Care (if not transferred): Primary RN, Tom, to continue to monitor patient for effectiveness of Xanax in lowering the patient's heart rate and respiratory rate, and improving her sensation of  increased work of breathing.  RN to follow up with rapid response for further needs.   Update: Upon my departure, patient states that she is "feeling a little bit better." HR 125 and RR 24 per dinamap monitor. Patient does not appear or sound to be getting as short of breath when speaking to me.   Event Summary:   Casimer Bilis

## 2018-03-12 NOTE — Progress Notes (Signed)
   03/12/18 2006  Vitals  Temp 98.6 F (37 C)  Temp Source Oral  BP (!) 75/45  BP Location Right Arm  BP Method Automatic  Patient Position (if appropriate) Lying  Pulse Rate (!) 122  Pulse Rate Source Dinamap  Resp 20  Oxygen Therapy  SpO2 100 %  O2 Device Room Air  MEWS Score  MEWS RR 0  MEWS Pulse 2  MEWS Systolic 2  MEWS LOC 0  MEWS Temp 0  MEWS Score 4  MEWS Score Color Red  mews initiated

## 2018-03-12 NOTE — ED Triage Notes (Addendum)
Patient here from work with complaints of swollen lymph nodes, SOB, facial swelling, dizziness, weakness, itching, and dry mouth x2 weeks. Denies diabetic history. Seen for same on 2/10 at [facility] and at PCP with no relief. States "they keep saying everything is ok". Patient crying in triage stating "they need to find out what is wrong with me".

## 2018-03-12 NOTE — Progress Notes (Signed)
Patient was transferred from ED to [facility] [place] due to transportation issue . Alert and oriented x 4. RN got report at 1728. Vital signs was taken. Room is set up. Call light is within patient's reach.

## 2018-03-12 NOTE — ED Provider Notes (Signed)
[facility] DEPT Provider Note   CSN: [id number] Arrival date & time: 03/12/18  7619     History   Chief Complaint Chief Complaint  Patient presents with  . Facial Swelling  . Dizziness  . Weakness  . Pruritis    HPI Erin Clay is a 38 y.o. female.  HPI  38 year old female presents with feeling poorly for multiple weeks.  Patient states she has been having fever and has been taking Tylenol intermittently, most recently around 5 AM.  It was last checked when she was in the ER last week but has been feeling like she is had a fever since.  She has intermittent headaches and dizziness that seem to go away with the Tylenol.  No cough but is feeling short of breath as well as weak/fatigued.  No sore throat but is having a very dry mouth.  She has had minimal to no diarrhea and one episode of vomiting this morning.  She has been having night sweats over these last couple weeks.  Her lymph nodes were swollen in her neck and painful though she is currently taking Augmentin from her last ED visit.  She feels like her face is swollen over her maxilla.  She is on low-dose prednisone, Plaquenil, and sulfasalazine for rheumatoid arthritis.  She has been feeling itching in her arms as well.  She recently traveled to [place] for about a month and returned in October 2019.  History reviewed. No pertinent past medical history.  Patient Active Problem List   Diagnosis Date Noted  . FUO (fever of unknown origin) 03/12/2018    History reviewed. No pertinent surgical history.   OB History   No obstetric history on file.      Home Medications    Prior to Admission medications   Medication Sig Start Date End Date Taking? Authorizing Provider  acetaminophen (TYLENOL) 500 MG tablet Take 1,000 mg by mouth every 6 (six) hours as needed for mild pain or headache.   Yes [provider]  amoxicillin-clavulanate (AUGMENTIN) 875-125 MG tablet Take 1 tablet by  mouth every 12 (twelve) hours. 03/09/18  Yes Hayden Rasmussen, MD  cetirizine (ZYRTEC) 10 MG tablet Take 10 mg by mouth daily as needed for allergies (itching).    Yes [provider]  cholecalciferol (VITAMIN D3) 25 MCG (1000 UT) tablet Take 1,000 Units by mouth daily.   Yes [provider]  Ferrous Sulfate (IRON PO) Take 1 tablet by mouth daily.   Yes [provider]  glucosamine-chondroitin 500-400 MG tablet Take 2 tablets by mouth daily.    Yes [provider]  hydroxychloroquine (PLAQUENIL) 200 MG tablet Take 400 mg by mouth daily. 02/17/18  Yes [provider]  ibuprofen (ADVIL,MOTRIN) 200 MG tablet Take 400 mg by mouth every 6 (six) hours as needed for headache or moderate pain.   Yes [provider]  meclizine (ANTIVERT) 32 MG tablet Take 1 tablet (32 mg total) by mouth 3 (three) times daily as needed. Patient taking differently: Take 32 mg by mouth 3 (three) times daily as needed for dizziness.  04/13/16  Yes Volanda Napoleon, PA-C  Multiple Vitamins-Minerals (MULTIVITAMIN WITH MINERALS) tablet Take 1 tablet by mouth daily.   Yes [provider]  predniSONE (DELTASONE) 5 MG tablet Take 5 mg by mouth daily.  02/04/18  Yes [provider]  sulfaSALAzine (AZULFIDINE) 500 MG EC tablet Take 500 mg by mouth 2 (two) times daily. 02/25/18  Yes [provider]    Family History No family history on file.  Social History Social History   Tobacco Use  . Smoking status: Never Smoker  . Smokeless tobacco: Never Used  Substance Use Topics  . Alcohol use: No  . Drug use: No     Allergies   Patient has no known allergies.   Review of Systems Review of Systems  Constitutional: Positive for diaphoresis and fever.  HENT: Positive for facial swelling.   Eyes: Negative for visual disturbance.  Respiratory: Positive for shortness of breath. Negative for cough.   Gastrointestinal: Positive for diarrhea and vomiting.  Negative for abdominal pain.  Genitourinary: Negative for dysuria.  Neurological: Positive for weakness, light-headedness and headaches.  All other systems reviewed and are negative.    Physical Exam Updated Vital Signs BP 112/64   Pulse (!) 118   Temp 98.4 F (36.9 C) (Oral)   Resp 15   Ht 5\' 7"  (1.702 m)   Wt 80.3 kg   LMP 02/26/2018 (Approximate) Comment: neg hcg  SpO2 99%   BMI 27.72 kg/m   Physical Exam Vitals signs and nursing note reviewed.  Constitutional:      General: She is not in acute distress.    Appearance: She is well-developed. She is not ill-appearing.  HENT:     Head: Normocephalic and atraumatic.     Right Ear: External ear normal.     Left Ear: External ear normal.     Nose: Nose normal.  Eyes:     General:        Right eye: No discharge.        Left eye: No discharge.     Extraocular Movements: Extraocular movements intact.     Pupils: Pupils are equal, round, and reactive to light.  Neck:     Musculoskeletal: Normal range of motion and neck supple. No neck rigidity or muscular tenderness.  Cardiovascular:     Rate and Rhythm: Regular rhythm. Tachycardia present.     Heart sounds: Normal heart sounds.  Pulmonary:     Effort: Pulmonary effort is normal.     Breath sounds: Normal breath sounds. No wheezing, rhonchi or rales.  Abdominal:     Palpations: Abdomen is soft.     Tenderness: There is no abdominal tenderness.  Lymphadenopathy:     Head:     Right side of head: Preauricular adenopathy present.     Left side of head: Preauricular adenopathy present.     Upper Body:     Right upper body: Supraclavicular adenopathy present.     Left upper body: Supraclavicular adenopathy present.  Skin:    General: Skin is warm and dry.  Neurological:     Mental Status: She is alert.     Comments: CN 3-12 grossly intact. 5/5 strength in all 4 extremities. Grossly normal sensation. Normal finger to nose.   Psychiatric:        Mood and Affect: Mood  is not anxious.      ED Treatments / Results  Labs (all labs ordered are listed, but only abnormal results are displayed) Labs Reviewed  URINALYSIS, ROUTINE W REFLEX MICROSCOPIC - Abnormal; Notable for the following components:      Result Value   APPearance HAZY (*)    Ketones, ur 20 (*)    Leukocytes,Ua TRACE (*)    Bacteria, UA FEW (*)    All other components within normal limits  CBC WITH DIFFERENTIAL/PLATELET - Abnormal; Notable for the following components:  WBC 10.9 (*)    Hemoglobin 11.4 (*)    RDW 15.6 (*)    Lymphs Abs 4.4 (*)    Monocytes Absolute 1.2 (*)    Abs Immature Granulocytes 0.17 (*)    All other components within normal limits  COMPREHENSIVE METABOLIC PANEL - Abnormal; Notable for the following components:   Potassium 2.9 (*)    BUN <5 (*)    Calcium 8.5 (*)    Albumin 3.4 (*)    AST 157 (*)    ALT 226 (*)    Alkaline Phosphatase 150 (*)    All other components within normal limits  LACTIC ACID, PLASMA - Abnormal; Notable for the following components:   Lactic Acid, Venous 2.0 (*)    All other components within normal limits  CULTURE, BLOOD (ROUTINE X 2)  CULTURE, BLOOD (ROUTINE X 2)  URINE CULTURE  TSH  MAGNESIUM  HIV ANTIBODY (ROUTINE TESTING W REFLEX)  LACTIC ACID, PLASMA  SEDIMENTATION RATE  C-REACTIVE PROTEIN  HEPATITIS PANEL, ACUTE  HIV-1 RNA QUANT-NO REFLEX-BLD  EPSTEIN-BARR VIRUS (EBV) ANTIBODY PROFILE  CMV IGM  CMV ANTIBODY, IGG (EIA)  CBG MONITORING, ED  CBG MONITORING, ED  I-STAT BETA HCG BLOOD, ED ([facility], [facility], [facility] ONLY)    EKG EKG Interpretation  Date/Time:  Thursday March 12 2018 07:51:58 EST Ventricular Rate:  102 PR Interval:    QRS Duration: 83 QT Interval:  343 QTC Calculation: 447 R Axis:   68 Text Interpretation:  Sinus tachycardia no acute ST/T changes No old tracing to compare Confirmed by Sherwood Gambler [phone]) on 03/12/2018 8:42:03 AM   Radiology Dg Chest 2 View  Result Date: 03/12/2018 CLINICAL DATA:   Dyspnea and fever EXAM: CHEST - 2 VIEW COMPARISON:  Three days ago FINDINGS: Mildly low lung volumes with interstitial crowding. There is no edema, consolidation, effusion, or pneumothorax. Normal heart size and mediastinal contours. IMPRESSION: Low volume chest with mild atelectasis. Electronically Signed   By: Monte Fantasia M.D.   On: 03/12/2018 10:37   Ct Angio Chest Pe W And/or Wo Contrast  Result Date: 03/12/2018 CLINICAL DATA:  PE suspected EXAM: CT ANGIOGRAPHY CHEST WITH CONTRAST TECHNIQUE: Multidetector CT imaging of the chest was performed using the standard protocol during bolus administration of intravenous contrast. Multiplanar CT image reconstructions and MIPs were obtained to evaluate the vascular anatomy. CONTRAST:  138mL ISOVUE-370 IOPAMIDOL (ISOVUE-370) INJECTION 76% COMPARISON:  Same day chest radiograph FINDINGS: Cardiovascular: Satisfactory opacification of the pulmonary arteries to the segmental level. No evidence of pulmonary embolism. Normal heart size. No pericardial effusion. Mediastinum/Nodes: Non-specific enlarged bilateral axillary lymph nodes, largest nodes in the right axilla measuring 2.8 cm. Thyroid gland, trachea, and esophagus demonstrate no significant findings. Lungs/Pleura: Bibasilar scarring or atelectasis. No pleural effusion or pneumothorax. Upper Abdomen: No acute abnormality. Musculoskeletal: No chest wall abnormality. No acute or significant osseous findings. Review of the MIP images confirms the above findings. IMPRESSION: 1.  Negative examination for pulmonary embolism. 2. Bibasilar scarring or atelectasis. 3. Non-specific enlarged bilateral axillary lymph nodes, largest nodes in the right axilla measuring 2.8 cm. Electronically Signed   By: Eddie Candle M.D.   On: 03/12/2018 15:55   Ct Abdomen Pelvis W Contrast  Result Date: 03/12/2018 CLINICAL DATA:  Fever of unknown origin EXAM: CT ABDOMEN AND PELVIS WITH CONTRAST TECHNIQUE: Multidetector CT imaging of the  abdomen and pelvis was performed using the standard protocol following bolus administration of intravenous contrast. CONTRAST:  112mL ISOVUE-370 IOPAMIDOL (ISOVUE-370) INJECTION 76% COMPARISON:  Right  upper quadrant ultrasound, 03/09/2018. FINDINGS: Lower chest: Please see separately reported CT examination of the chest. Hepatobiliary: No focal liver abnormality is seen. Minimal sludge in the dependent gallbladder. No gallbladder wall thickening, or biliary dilatation. Pancreas: Unremarkable. No pancreatic ductal dilatation or surrounding inflammatory changes. Spleen: Normal in size without focal abnormality. Adrenals/Urinary Tract: Adrenal glands are unremarkable. Kidneys are normal, without renal calculi, focal lesion, or hydronephrosis. Bladder is unremarkable. Stomach/Bowel: Stomach is within normal limits. Appendix appears normal. No evidence of bowel wall thickening, distention, or inflammatory changes. Vascular/Lymphatic: No significant vascular findings are present. No enlarged abdominal or pelvic lymph nodes. Reproductive: No mass or other abnormality. Other: No abdominal wall hernia or abnormality. No abdominopelvic ascites. Musculoskeletal: No acute or significant osseous findings. IMPRESSION: 1. Minimal sludge in the dependent gallbladder, as seen on prior ultrasound examination. No acute findings. 2. No acute CT findings in the abdomen or pelvis to explain fever. Normal appendix. Electronically Signed   By: Eddie Candle M.D.   On: 03/12/2018 16:00    Procedures .Critical Care Performed by: Sherwood Gambler, MD Authorized by: Sherwood Gambler, MD   Critical care provider statement:    Critical care time (minutes):  35   Critical care time was exclusive of:  Separately billable procedures and treating other patients   Critical care was necessary to treat or prevent imminent or life-threatening deterioration of the following conditions:  Circulatory failure   Critical care was time spent  personally by me on the following activities:  Development of treatment plan with patient or surrogate, discussions with consultants, evaluation of patient's response to treatment, examination of patient, obtaining history from patient or surrogate, ordering and performing treatments and interventions, ordering and review of laboratory studies, ordering and review of radiographic studies, pulse oximetry, re-evaluation of patient's condition and review of old charts   (including critical care time)  Medications Ordered in ED Medications  lactated ringers bolus 1,000 mL (1,000 mLs Intravenous New Bag/Given 03/12/18 1611)  iopamidol (ISOVUE-370) 76 % injection (has no administration in time range)  potassium chloride SA (K-DUR,KLOR-CON) CR tablet 40 mEq (has no administration in time range)  lactated ringers infusion (has no administration in time range)  sodium chloride flush (NS) 0.9 % injection 3 mL (3 mLs Intravenous Given 03/12/18 1613)  lactated ringers bolus 1,000 mL (0 mLs Intravenous Stopped 03/12/18 1145)  potassium chloride SA (K-DUR,KLOR-CON) CR tablet 40 mEq (40 mEq Oral Given 03/12/18 1145)  potassium chloride 10 mEq in 100 mL IVPB (0 mEq Intravenous Stopped 03/12/18 1358)  lactated ringers bolus 1,000 mL (0 mLs Intravenous Stopped 03/12/18 1358)  ibuprofen (ADVIL,MOTRIN) tablet 600 mg (600 mg Oral Given 03/12/18 1249)  iopamidol (ISOVUE-370) 76 % injection 100 mL (100 mLs Intravenous Contrast Given 03/12/18 1527)     Initial Impression / Assessment and Plan / ED Course  I have reviewed the triage vital signs and the nursing notes.  Pertinent labs & imaging results that were available during my care of the patient were reviewed by me and considered in my medical decision making (see chart for details).     Patient is complex.  There is no clear cause for her symptoms.  While she has an intermittent headache, it appears mild and only when she has the fever.  No meningismus.  She has been  to [place] but not for several months and when I talked infectious disease they feel like this is unlikely to be from traveling.  Labs show mildly worse LFTs and mild leukocytosis.  However despite IV fluids and then ibuprofen for when she had chills, she has becoming progressively more tachycardic.  Thyroid stimulating hormone is negative.  I discussed with the hospitalist, given the continued sinus tachycardia in the 120/130 range, she will probably need admission and expedited work-up.  She does have lymphadenopathy and I wonder if this can be biopsied to help assist in diagnosis.  Dr. Florene Glen asked for CT of chest and abdomen pelvis.  These will be ordered as we are still searching for source.  Hospitalist will admit.  Final Clinical Impressions(s) / ED Diagnoses   Final diagnoses:  Fever, unknown origin  Lactic acidosis    ED Discharge Orders    None       Sherwood Gambler, MD 03/12/18 [phone]

## 2018-03-12 NOTE — H&P (Signed)
History and Physical    Erin Clay GDJ:[id number] DOB: 05/14/1980 DOA: 03/12/2018  PCP: Patient, No Pcp Per  Patient coming from: home  I have personally briefly reviewed patient's old medical records in [place]  Chief Complaint: fever  HPI: Erin Clay is Erin Clay 38 y.o. female with medical history significant of rheumatoid arthritis on Plaquenil, sulfasalazine, and prednisone presenting with persistent fevers, shortness of breath and malaise.  She notes that her symptoms started around 2 weeks ago.  She noticed Lian Tanori fever around that time and noticed swollen lymph nodes in her neck around the same period of time.  She noticed decreased appetite as well as shortness of breath and dry mouth as well.  She notes that now the only thing that she can take by mouth is water.  She feels like her face is swollen.  She notes nightly fevers and night sweats.  She thinks that she may have also lost some weight over this period time and feels well.  She notes occasional headache, but not at this time.  She notes Verneda Hollopeter cough.  This is occasionally productive.  She notes vomiting that occurred yesterday.  She denies any sore throat.  She denies any chest pain.  She endorses shortness of breath with exertion.  She denies abdominal pain or rash.  She did notice itching the other day but got better with Zyrtec.  She denies any symptoms of urinary tract infection.  She notes travel to [place] for 1 month in October.  She denies any travel since then.  She denies any sick contacts.  She is sexually active, with female partners, but denies Zacharey Jensen history of sexually transmitted infection.  ED Course: Labs, imaging, IVF.  Admit for FUO.    Review of Systems: As per HPI otherwise 10 point review of systems negative.   History reviewed. No pertinent past medical history.  Rheumatoid Arthritis.  History reviewed. No pertinent surgical history.   reports that she has never smoked. She has never used smokeless tobacco. She  reports that she does not drink alcohol or use drugs.  No Known Allergies  No family history on file. Notes FH of RA.  Prior to Admission medications   Medication Sig Start Date End Date Taking? Authorizing Provider  acetaminophen (TYLENOL) 500 MG tablet Take 1,000 mg by mouth every 6 (six) hours as needed for mild pain or headache.   Yes [provider]  amoxicillin-clavulanate (AUGMENTIN) 875-125 MG tablet Take 1 tablet by mouth every 12 (twelve) hours. 03/09/18  Yes Hayden Rasmussen, MD  cetirizine (ZYRTEC) 10 MG tablet Take 10 mg by mouth daily as needed for allergies (itching).    Yes [provider]  cholecalciferol (VITAMIN D3) 25 MCG (1000 UT) tablet Take 1,000 Units by mouth daily.   Yes [provider]  Ferrous Sulfate (IRON PO) Take 1 tablet by mouth daily.   Yes [provider]  glucosamine-chondroitin 500-400 MG tablet Take 2 tablets by mouth daily.    Yes [provider]  hydroxychloroquine (PLAQUENIL) 200 MG tablet Take 400 mg by mouth daily. 02/17/18  Yes [provider]  ibuprofen (ADVIL,MOTRIN) 200 MG tablet Take 400 mg by mouth every 6 (six) hours as needed for headache or moderate pain.   Yes [provider]  meclizine (ANTIVERT) 32 MG tablet Take 1 tablet (32 mg total) by mouth 3 (three) times daily as needed. Patient taking differently: Take 32 mg by mouth 3 (three) times daily as needed for dizziness.  04/13/16  Yes Volanda Napoleon, PA-C  Multiple Vitamins-Minerals (MULTIVITAMIN WITH MINERALS) tablet Take 1 tablet by mouth daily.   Yes [provider]  predniSONE (DELTASONE) 5 MG tablet Take 5 mg by mouth daily.  02/04/18  Yes [provider]  sulfaSALAzine (AZULFIDINE) 500 MG EC tablet Take 500 mg by mouth 2 (two) times daily. 02/25/18  Yes [provider]    Physical Exam: Vitals:   03/12/18 1630 03/12/18 1720 03/12/18 1800 03/12/18 1845  BP: (!) 100/52 (!) 97/51  108/69  Pulse:  (!) 112 (!) 112  (!) 108  Resp: (!) 25 (!) 24 20 (!) 24  Temp:  98.2 F (36.8 C)  98.9 F (37.2 C)  TempSrc:      SpO2: 100% 99%  100%  Weight:      Height:        Constitutional: NAD, calm, comfortable Vitals:   03/12/18 1630 03/12/18 1720 03/12/18 1800 03/12/18 1845  BP: (!) 100/52 (!) 97/51  108/69  Pulse: (!) 112 (!) 112  (!) 108  Resp: (!) 25 (!) 24 20 (!) 24  Temp:  98.2 F (36.8 C)  98.9 F (37.2 C)  TempSrc:      SpO2: 100% 99%  100%  Weight:      Height:       Eyes: PERRL, lids and conjunctivae normal ENMT: Mucous membranes are moist. Posterior pharynx clear of any exudate or lesions.Normal dentition.  Neck: precervical and posterior cervical lymphadenopathy, supraclavicular LAD Respiratory: clear to auscultation bilaterally, no wheezing, no crackles.  Cardiovascular: tachycardic rate and regular rhythm, no murmurs / rubs / gallops. No extremity edema. 2+ pedal pulses. No carotid bruits.  Abdomen: no tenderness, no masses palpated. No hepatosplenomegaly. Bowel sounds positive.  Musculoskeletal: no clubbing / cyanosis. No joint deformity upper and lower extremities. Good ROM, no contractures. Normal muscle tone.  Skin: no rashes, lesions, ulcers. No induration Neurologic: CN 2-12 grossly intact. Sensation intact. Moving all extremities.  Psychiatric: Normal judgment and insight. Alert and oriented x 3. Normal mood.   Labs on Admission: I have personally reviewed following labs and imaging studies  CBC: Recent Labs  Lab 03/09/18 0843 03/12/18 0844  WBC 8.6 10.9*  NEUTROABS 4.2 4.6  HGB 11.2* 11.4*  HCT 36.5 36.9  MCV 82.0 85.0  PLT 239 007   Basic Metabolic Panel: Recent Labs  Lab 03/09/18 0843 03/12/18 0844  NA 133* 136  K 3.3* 2.9*  CL 99 103  CO2 19* 23  GLUCOSE 96 90  BUN <5* <5*  CREATININE 0.84 0.60  CALCIUM 8.4* 8.5*  MG  --  2.3   GFR: Estimated Creatinine Clearance: 106 mL/min (by C-G formula based on SCr of 0.6 mg/dL). Liver Function  Tests: Recent Labs  Lab 03/09/18 0843 03/12/18 0844  AST 80* 157*  ALT 116* 226*  ALKPHOS 89 150*  BILITOT 1.0 1.0  PROT 7.0 6.8  ALBUMIN 3.4* 3.4*   No results for input(s): LIPASE, AMYLASE in the last 168 hours. No results for input(s): AMMONIA in the last 168 hours. Coagulation Profile: No results for input(s): INR, PROTIME in the last 168 hours. Cardiac Enzymes: No results for input(s): CKTOTAL, CKMB, CKMBINDEX, TROPONINI in the last 168 hours. BNP (last 3 results) No results for input(s): PROBNP in the last 8760 hours. HbA1C: No results for input(s): HGBA1C in the last 72 hours. CBG: Recent Labs  Lab 03/12/18 0757  GLUCAP 86   Lipid Profile: No results for input(s): CHOL, HDL, LDLCALC,  TRIG, CHOLHDL, LDLDIRECT in the last 72 hours. Thyroid Function Tests: Recent Labs    03/12/18 0844  TSH 1.964   Anemia Panel: No results for input(s): VITAMINB12, FOLATE, FERRITIN, TIBC, IRON, RETICCTPCT in the last 72 hours. Urine analysis:    Component Value Date/Time   COLORURINE YELLOW 03/12/2018 1142   APPEARANCEUR HAZY (Amsi Grimley) 03/12/2018 1142   LABSPEC 1.006 03/12/2018 1142   PHURINE 6.0 03/12/2018 1142   GLUCOSEU NEGATIVE 03/12/2018 1142   HGBUR NEGATIVE 03/12/2018 1142   BILIRUBINUR NEGATIVE 03/12/2018 1142   KETONESUR 20 (Omere Marti) 03/12/2018 1142   PROTEINUR NEGATIVE 03/12/2018 1142   NITRITE NEGATIVE 03/12/2018 1142   LEUKOCYTESUR TRACE (Evin Loiseau) 03/12/2018 1142    Radiological Exams on Admission: Dg Chest 2 View  Result Date: 03/12/2018 CLINICAL DATA:  Dyspnea and fever EXAM: CHEST - 2 VIEW COMPARISON:  Three days ago FINDINGS: Mildly low lung volumes with interstitial crowding. There is no edema, consolidation, effusion, or pneumothorax. Normal heart size and mediastinal contours. IMPRESSION: Low volume chest with mild atelectasis. Electronically Signed   By: Monte Fantasia M.D.   On: 03/12/2018 10:37   Ct Angio Chest Pe W And/or Wo Contrast  Result Date:  03/12/2018 CLINICAL DATA:  PE suspected EXAM: CT ANGIOGRAPHY CHEST WITH CONTRAST TECHNIQUE: Multidetector CT imaging of the chest was performed using the standard protocol during bolus administration of intravenous contrast. Multiplanar CT image reconstructions and MIPs were obtained to evaluate the vascular anatomy. CONTRAST:  140m ISOVUE-370 IOPAMIDOL (ISOVUE-370) INJECTION 76% COMPARISON:  Same day chest radiograph FINDINGS: Cardiovascular: Satisfactory opacification of the pulmonary arteries to the segmental level. No evidence of pulmonary embolism. Normal heart size. No pericardial effusion. Mediastinum/Nodes: Non-specific enlarged bilateral axillary lymph nodes, largest nodes in the right axilla measuring 2.8 cm. Thyroid gland, trachea, and esophagus demonstrate no significant findings. Lungs/Pleura: Bibasilar scarring or atelectasis. No pleural effusion or pneumothorax. Upper Abdomen: No acute abnormality. Musculoskeletal: No chest wall abnormality. No acute or significant osseous findings. Review of the MIP images confirms the above findings. IMPRESSION: 1.  Negative examination for pulmonary embolism. 2. Bibasilar scarring or atelectasis. 3. Non-specific enlarged bilateral axillary lymph nodes, largest nodes in the right axilla measuring 2.8 cm. Electronically Signed   By: AEddie CandleM.D.   On: 03/12/2018 15:55   Ct Abdomen Pelvis W Contrast  Result Date: 03/12/2018 CLINICAL DATA:  Fever of unknown origin EXAM: CT ABDOMEN AND PELVIS WITH CONTRAST TECHNIQUE: Multidetector CT imaging of the abdomen and pelvis was performed using the standard protocol following bolus administration of intravenous contrast. CONTRAST:  1016mISOVUE-370 IOPAMIDOL (ISOVUE-370) INJECTION 76% COMPARISON:  Right upper quadrant ultrasound, 03/09/2018. FINDINGS: Lower chest: Please see separately reported CT examination of the chest. Hepatobiliary: No focal liver abnormality is seen. Minimal sludge in the dependent gallbladder.  No gallbladder wall thickening, or biliary dilatation. Pancreas: Unremarkable. No pancreatic ductal dilatation or surrounding inflammatory changes. Spleen: Normal in size without focal abnormality. Adrenals/Urinary Tract: Adrenal glands are unremarkable. Kidneys are normal, without renal calculi, focal lesion, or hydronephrosis. Bladder is unremarkable. Stomach/Bowel: Stomach is within normal limits. Appendix appears normal. No evidence of bowel wall thickening, distention, or inflammatory changes. Vascular/Lymphatic: No significant vascular findings are present. No enlarged abdominal or pelvic lymph nodes. Reproductive: No mass or other abnormality. Other: No abdominal wall hernia or abnormality. No abdominopelvic ascites. Musculoskeletal: No acute or significant osseous findings. IMPRESSION: 1. Minimal sludge in the dependent gallbladder, as seen on prior ultrasound examination. No acute findings. 2. No acute CT findings in the abdomen  or pelvis to explain fever. Normal appendix. Electronically Signed   By: Eddie Candle M.D.   On: 03/12/2018 16:00    EKG: Independently reviewed. Sinus tachy, no prior for comparison  Assessment/Plan Active Problems:   FUO (fever of unknown origin)   Fever of Unknown Origin: ~ 2 weeks of fever, SOB, lymphadenopathy.  She notes nightly fevers and night sweats.  Travel to [place] in October, but no travel or sick contacts since.  She's on chronic plaquenil, prednisone, sulfasalazine for RA.  No obvious infectious cause at this point, further w/u pending.  Consider inflammatory causes and malignancy (lymphoma? With LAD and B symptoms).   CT chest, abdomen, pelvis without acute findings, but notable for enlarged bilateral axillary LN.   Negative flu on 2/10 Negative acute hepatitis panel and mono screen on 2/10 Negative strep on 2/10 Blood cx from 2/10 NGTD x 3 Follow EBV ab profile, HIV ab, HIV RNA PCR Follow CMV IgG, IgM Follow urine culture Follow ESR, CRP Follow  LDH Holding abx at this point in time given no obvious source Consider discussion with ID tomorrow Pending results above, consider discussion with surgery regarding excisional biopsy of axillary lymph node  Elevated LFTs: [place] on 2/10 with sludge in gallbladder.  Given above, follow CMV, EBV studies.  Negative acute hepatitis panel done on 2/10.  She was taking APAP at home, but no more than 6 500 mg tablets daily.  Lymphadenopathy: likely 2/2 above, follow  SIRS: tachycardic, mild leukocytosis, suspect 2/2 process above   Rheumatoid Arthritis: continue plaquinil, sulfasalazine, prednisone  DVT prophylaxis: SCD Code Status: full Family Communication: sister at bedside  Disposition Plan: pending improvement, further work up  [employer] called: none  Admission status: observation   Fayrene Helper MD [facility] Pager AMION  If 7PM-7AM, please contact night-coverage [email or web address] Password [facility]  03/12/2018, 7:16 PM

## 2018-03-12 NOTE — ED Notes (Signed)
ED TO INPATIENT HANDOFF REPORT  Name/Age/Gender Erin Clay 38 y.o. female  Code Status   Home/SNF/Other Home  Chief Complaint Dry Mouth; Facial Swelling; Weakness; Dizziness  Level of Care/Admitting Diagnosis ED Disposition    ED Disposition Condition Comment   Admit  Hospital Area: [facility] [[id number]]  Level of Care: Telemetry [5]  Admit to tele based on following criteria: Complex arrhythmia (Bradycardia/Tachycardia)  Diagnosis: FUO (fever of unknown origin) [322025]  Admitting Physician: Elodia Florence [phone]  Attending Physician: Cephus Slater, A CALDWELL [phone]  PT Class (Do Not Modify): Observation [104]  PT Acc Code (Do Not Modify): Observation [10022]       Medical History History reviewed. No pertinent past medical history.  Allergies No Known Allergies  IV Location/Drains/Wounds Patient Lines/Drains/Airways Status   Active Line/Drains/Airways    Name:   Placement date:   Placement time:   Site:   Days:   Peripheral IV 03/12/18 Left Antecubital   03/12/18    0902    Antecubital   less than 1          Labs/Imaging Results for orders placed or performed during the hospital encounter of 03/12/18 (from the past 48 hour(s))  CBG monitoring, ED     Status: None   Collection Time: 03/12/18  7:57 AM  Result Value Ref Range   Glucose-Capillary 86 70 - 99 mg/dL  CBC with Differential     Status: Abnormal   Collection Time: 03/12/18  8:44 AM  Result Value Ref Range   WBC 10.9 (H) 4.0 - 10.5 K/uL   RBC 4.34 3.87 - 5.11 MIL/uL   Hemoglobin 11.4 (L) 12.0 - 15.0 g/dL   HCT 36.9 36.0 - 46.0 %   MCV 85.0 80.0 - 100.0 fL   MCH 26.3 26.0 - 34.0 pg   MCHC 30.9 30.0 - 36.0 g/dL   RDW 15.6 (H) 11.5 - 15.5 %   Platelets 245 150 - 400 K/uL   nRBC 0.2 0.0 - 0.2 %   Neutrophils Relative % 42 %   Neutro Abs 4.6 1.7 - 7.7 K/uL   Lymphocytes Relative 40 %   Lymphs Abs 4.4 (H) 0.7 - 4.0 K/uL   Monocytes Relative 11 %   Monocytes Absolute  1.2 (H) 0.1 - 1.0 K/uL   Eosinophils Relative 4 %   Eosinophils Absolute 0.4 0.0 - 0.5 K/uL   Basophils Relative 1 %   Basophils Absolute 0.1 0.0 - 0.1 K/uL   Immature Granulocytes 2 %   Abs Immature Granulocytes 0.17 (H) 0.00 - 0.07 K/uL   Polychromasia PRESENT     Comment: Performed at [facility], [place] [address] [place], [place] [address]  Comprehensive metabolic panel     Status: Abnormal   Collection Time: 03/12/18  8:44 AM  Result Value Ref Range   Sodium 136 135 - 145 mmol/L   Potassium 2.9 (L) 3.5 - 5.1 mmol/L   Chloride 103 98 - 111 mmol/L   CO2 23 22 - 32 mmol/L   Glucose, Bld 90 70 - 99 mg/dL   BUN <5 (L) 6 - 20 mg/dL   Creatinine, Ser 0.60 0.44 - 1.00 mg/dL   Calcium 8.5 (L) 8.9 - 10.3 mg/dL   Total Protein 6.8 6.5 - 8.1 g/dL   Albumin 3.4 (L) 3.5 - 5.0 g/dL   AST 157 (H) 15 - 41 U/L   ALT 226 (H) 0 - 44 U/L   Alkaline Phosphatase 150 (H) 38 -  126 U/L   Total Bilirubin 1.0 0.3 - 1.2 mg/dL   GFR calc non Af Amer >60 >60 mL/min   GFR calc Af Amer >60 >60 mL/min   Anion gap 10 5 - 15    Comment: Performed at [facility], [place] [address] [place], [place] [address]  TSH     Status: None   Collection Time: 03/12/18  8:44 AM  Result Value Ref Range   TSH 1.964 0.350 - 4.500 uIU/mL    Comment: Performed by a 3rd Generation assay with a functional sensitivity of <=0.01 uIU/mL. Performed at [facility], [place] [address] [place], [place] [address]   Magnesium     Status: None   Collection Time: 03/12/18  8:44 AM  Result Value Ref Range   Magnesium 2.3 1.7 - 2.4 mg/dL    Comment: Performed at [facility], [place] [address], [place], [place] [address]  I-Stat beta hCG blood, ED     Status: None   Collection Time: 03/12/18  9:11 AM  Result Value Ref Range   I-stat hCG, quantitative <5.0 <5 mIU/mL   Comment 3            Comment:   GEST. AGE      CONC.  (mIU/mL)   <=1 WEEK        5 - 50     2  WEEKS       50 - 500     3 WEEKS       100 - 10,000     4 WEEKS     1,000 - 30,000        FEMALE AND NON-PREGNANT FEMALE:     LESS THAN 5 mIU/mL   Urinalysis, Routine w reflex microscopic     Status: Abnormal   Collection Time: 03/12/18 11:42 AM  Result Value Ref Range   Color, Urine YELLOW YELLOW   APPearance HAZY (A) CLEAR   Specific Gravity, Urine 1.006 1.005 - 1.030   pH 6.0 5.0 - 8.0   Glucose, UA NEGATIVE NEGATIVE mg/dL   Hgb urine dipstick NEGATIVE NEGATIVE   Bilirubin Urine NEGATIVE NEGATIVE   Ketones, ur 20 (A) NEGATIVE mg/dL   Protein, ur NEGATIVE NEGATIVE mg/dL   Nitrite NEGATIVE NEGATIVE   Leukocytes,Ua TRACE (A) NEGATIVE   RBC / HPF 0-5 0 - 5 RBC/hpf   WBC, UA 0-5 0 - 5 WBC/hpf   Bacteria, UA FEW (A) NONE SEEN   Squamous Epithelial / LPF 6-10 0 - 5    Comment: Performed at [facility], [place] [address], [place], [place] [address]  Lactic acid, plasma     Status: Abnormal   Collection Time: 03/12/18  2:23 PM  Result Value Ref Range   Lactic Acid, Venous 2.0 (HH) 0.5 - 1.9 mmol/L    Comment: CRITICAL RESULT CALLED TO, READ BACK BY AND VERIFIED WITH: M.BRILL AT 1523 ON 03/12/18 BY N.THOMPSON Performed at [facility], [place] [address], [place], [place] [address]    Dg Chest 2 View  Result Date: 03/12/2018 CLINICAL DATA:  Dyspnea and fever EXAM: CHEST - 2 VIEW COMPARISON:  Three days ago FINDINGS: Mildly low lung volumes with interstitial crowding. There is no edema, consolidation, effusion, or pneumothorax. Normal heart size and mediastinal contours. IMPRESSION: Low volume chest with mild atelectasis. Electronically Signed   By: Monte Fantasia M.D.   On: 03/12/2018 10:37   Ct Angio Chest Pe W And/or Wo Contrast  Result Date: 03/12/2018 CLINICAL DATA:  PE suspected EXAM: CT ANGIOGRAPHY CHEST WITH CONTRAST TECHNIQUE: Multidetector CT imaging of the chest was performed using the standard protocol during bolus administration of  intravenous contrast. Multiplanar CT image reconstructions and MIPs were obtained to evaluate the vascular anatomy. CONTRAST:  159mL ISOVUE-370 IOPAMIDOL (ISOVUE-370) INJECTION 76% COMPARISON:  Same day chest radiograph FINDINGS: Cardiovascular: Satisfactory opacification of the pulmonary arteries to the segmental level. No evidence of pulmonary embolism. Normal heart size. No pericardial effusion. Mediastinum/Nodes: Non-specific enlarged bilateral axillary lymph nodes, largest nodes in the right axilla measuring 2.8 cm. Thyroid gland, trachea, and esophagus demonstrate no significant findings. Lungs/Pleura: Bibasilar scarring or atelectasis. No pleural effusion or pneumothorax. Upper Abdomen: No acute abnormality. Musculoskeletal: No chest wall abnormality. No acute or significant osseous findings. Review of the MIP images confirms the above findings. IMPRESSION: 1.  Negative examination for pulmonary embolism. 2. Bibasilar scarring or atelectasis. 3. Non-specific enlarged bilateral axillary lymph nodes, largest nodes in the right axilla measuring 2.8 cm. Electronically Signed   By: Eddie Candle M.D.   On: 03/12/2018 15:55   Ct Abdomen Pelvis W Contrast  Result Date: 03/12/2018 CLINICAL DATA:  Fever of unknown origin EXAM: CT ABDOMEN AND PELVIS WITH CONTRAST TECHNIQUE: Multidetector CT imaging of the abdomen and pelvis was performed using the standard protocol following bolus administration of intravenous contrast. CONTRAST:  120mL ISOVUE-370 IOPAMIDOL (ISOVUE-370) INJECTION 76% COMPARISON:  Right upper quadrant ultrasound, 03/09/2018. FINDINGS: Lower chest: Please see separately reported CT examination of the chest. Hepatobiliary: No focal liver abnormality is seen. Minimal sludge in the dependent gallbladder. No gallbladder wall thickening, or biliary dilatation. Pancreas: Unremarkable. No pancreatic ductal dilatation or surrounding inflammatory changes. Spleen: Normal in size without focal abnormality.  Adrenals/Urinary Tract: Adrenal glands are unremarkable. Kidneys are normal, without renal calculi, focal lesion, or hydronephrosis. Bladder is unremarkable. Stomach/Bowel: Stomach is within normal limits. Appendix appears normal. No evidence of bowel wall thickening, distention, or inflammatory changes. Vascular/Lymphatic: No significant vascular findings are present. No enlarged abdominal or pelvic lymph nodes. Reproductive: No mass or other abnormality. Other: No abdominal wall hernia or abnormality. No abdominopelvic ascites. Musculoskeletal: No acute or significant osseous findings. IMPRESSION: 1. Minimal sludge in the dependent gallbladder, as seen on prior ultrasound examination. No acute findings. 2. No acute CT findings in the abdomen or pelvis to explain fever. Normal appendix. Electronically Signed   By: Eddie Candle M.D.   On: 03/12/2018 16:00    Pending Labs Unresulted Labs (From admission, onward)    Start     Ordered   03/12/18 1605  HIV-1 RNA quant-no reflex-bld  Add-on,   R     03/12/18 1604   03/12/18 1605  EPSTEIN-BARR VIRUS (EBV) Antibody Profile  Add-on,   R     03/12/18 1604   03/12/18 1605  CMV IgM  (CMV Antibodies by EIA (PNL))  Add-on,   R     03/12/18 1604   03/12/18 1605  Cmv antibody, IgG (EIA)  (CMV Antibodies by EIA (PNL))  Add-on,   R     03/12/18 1604   03/12/18 1604  Hepatitis panel, acute  Add-on,   R     03/12/18 1604   03/12/18 1603  Sedimentation rate  Add-on,   R     03/12/18 1604   03/12/18 1603  C-reactive protein  Add-on,   R     03/12/18 1604   03/12/18 1501  Culture, Urine  Once,   R  03/12/18 1500   03/12/18 1416  Lactic acid, plasma  Now then every 2 hours,   STAT     03/12/18 1415   03/12/18 0854  Culture, blood (routine x 2)  BLOOD CULTURE X 2,   STAT     03/12/18 0853   03/12/18 0844  HIV antibody  Once,   STAT     03/12/18 0843   Signed and Held  Comprehensive metabolic panel  Tomorrow morning,   R     Signed and Held   Signed and Held   CBC  Tomorrow morning,   R     Signed and Held          Vitals/Pain Today's Vitals   03/12/18 1438 03/12/18 1611 03/12/18 1620 03/12/18 1630  BP:  112/64 (!) 86/49 (!) 100/52  Pulse: (!) 130 (!) 118 (!) 112 (!) 112  Resp: 20 15 (!) 23 (!) 25  Temp:  98.4 F (36.9 C)    TempSrc:  Oral    SpO2: 100% 99% 100% 100%  Weight:      Height:      PainSc:        Isolation Precautions No active isolations  Medications Medications  potassium chloride SA (K-DUR,KLOR-CON) CR tablet 40 mEq (has no administration in time range)  lactated ringers infusion (has no administration in time range)  sodium chloride flush (NS) 0.9 % injection 3 mL (3 mLs Intravenous Given 03/12/18 1613)  lactated ringers bolus 1,000 mL (0 mLs Intravenous Stopped 03/12/18 1145)  potassium chloride SA (K-DUR,KLOR-CON) CR tablet 40 mEq (40 mEq Oral Given 03/12/18 1145)  potassium chloride 10 mEq in 100 mL IVPB (0 mEq Intravenous Stopped 03/12/18 1358)  lactated ringers bolus 1,000 mL (0 mLs Intravenous Stopped 03/12/18 1358)  ibuprofen (ADVIL,MOTRIN) tablet 600 mg (600 mg Oral Given 03/12/18 1249)  lactated ringers bolus 1,000 mL (0 mLs Intravenous Stopped 03/12/18 1720)  iopamidol (ISOVUE-370) 76 % injection 100 mL (100 mLs Intravenous Contrast Given 03/12/18 1527)    Mobility walks

## 2018-03-13 ENCOUNTER — Observation Stay (HOSPITAL_COMMUNITY): Payer: BLUE CROSS/BLUE SHIELD | Admitting: Anesthesiology

## 2018-03-13 ENCOUNTER — Observation Stay (HOSPITAL_COMMUNITY): Payer: BLUE CROSS/BLUE SHIELD

## 2018-03-13 ENCOUNTER — Encounter (HOSPITAL_COMMUNITY)
Admission: EM | Disposition: A | Discharge status: Home / Self Care | Payer: Self-pay | Source: Home / Self Care | Attending: Internal Medicine

## 2018-03-13 DIAGNOSIS — D649 Anemia, unspecified: Secondary | ICD-10-CM | POA: Diagnosis present

## 2018-03-13 DIAGNOSIS — E872 Acidosis: Secondary | ICD-10-CM | POA: Diagnosis not present

## 2018-03-13 DIAGNOSIS — R590 Localized enlarged lymph nodes: Secondary | ICD-10-CM | POA: Diagnosis present

## 2018-03-13 DIAGNOSIS — M069 Rheumatoid arthritis, unspecified: Secondary | ICD-10-CM | POA: Diagnosis present

## 2018-03-13 DIAGNOSIS — J9601 Acute respiratory failure with hypoxia: Secondary | ICD-10-CM

## 2018-03-13 DIAGNOSIS — R509 Fever, unspecified: Secondary | ICD-10-CM | POA: Diagnosis present

## 2018-03-13 DIAGNOSIS — R748 Abnormal levels of other serum enzymes: Secondary | ICD-10-CM | POA: Diagnosis present

## 2018-03-13 HISTORY — PX: AXILLARY LYMPH NODE BIOPSY: SHX5737

## 2018-03-13 LAB — PATHOLOGIST SMEAR REVIEW

## 2018-03-13 LAB — HIV-1 RNA QUANT-NO REFLEX-BLD
HIV 1 RNA Quant: <20 copies/mL
LOG10 HIV-1 RNA: UNDETERMINED log10copy/mL

## 2018-03-13 LAB — COMPREHENSIVE METABOLIC PANEL
ALT: 285 U/L — AB (ref 0–44)
AST: 192 U/L — ABNORMAL HIGH (ref 15–41)
Albumin: 2.9 g/dL — ABNORMAL LOW (ref 3.5–5.0)
Alkaline Phosphatase: 151 U/L — ABNORMAL HIGH (ref 38–126)
Anion gap: 10 (ref 5–15)
BUN: <5 mg/dL — ABNORMAL LOW (ref 6–20)
CO2: 22 mmol/L (ref 22–32)
Calcium: 8.6 mg/dL — ABNORMAL LOW (ref 8.9–10.3)
Chloride: 107 mmol/L (ref 98–111)
Creatinine, Ser: 0.47 mg/dL (ref 0.44–1.00)
GFR calc Af Amer: >60 mL/min (ref >60)
GFR calc non Af Amer: >60 mL/min (ref >60)
Glucose, Bld: 113 mg/dL — ABNORMAL HIGH (ref 70–99)
Potassium: 4.7 mmol/L (ref 3.5–5.1)
SODIUM: 139 mmol/L (ref 135–145)
Total Bilirubin: 0.8 mg/dL (ref 0.3–1.2)
Total Protein: 5.8 g/dL — ABNORMAL LOW (ref 6.5–8.1)

## 2018-03-13 LAB — CBC WITH DIFFERENTIAL/PLATELET
BAND NEUTROPHILS: 0 %
BASOS ABS: 0.2 10*3/uL — AB (ref 0.0–0.1)
BASOS PCT: 1 %
Blasts: 0 %
EOS ABS: 0.2 10*3/uL (ref 0.0–0.5)
Eosinophils Relative: 1 %
HCT: 37.5 % (ref 36.0–46.0)
Hemoglobin: 11.2 g/dL — ABNORMAL LOW (ref 12.0–15.0)
Lymphocytes Relative: 52 %
Lymphs Abs: 8.2 10*3/uL — ABNORMAL HIGH (ref 0.7–4.0)
MCH: 26 pg (ref 26.0–34.0)
MCHC: 29.9 g/dL — ABNORMAL LOW (ref 30.0–36.0)
MCV: 87.2 fL (ref 80.0–100.0)
Metamyelocytes Relative: 0 %
Monocytes Absolute: 2.1 10*3/uL — ABNORMAL HIGH (ref 0.1–1.0)
Monocytes Relative: 13 %
Myelocytes: 0 %
Neutro Abs: 5.2 10*3/uL (ref 1.7–7.7)
Neutrophils Relative %: 33 %
Other: 0 %
Platelets: 269 10*3/uL (ref 150–400)
Promyelocytes Relative: 0 %
RBC: 4.3 MIL/uL (ref 3.87–5.11)
RDW: 15.9 % — ABNORMAL HIGH (ref 11.5–15.5)
WBC Morphology: ABNORMAL
WBC: 15.9 10*3/uL — ABNORMAL HIGH (ref 4.0–10.5)
nRBC: 0 /100 WBC
nRBC: 0.1 % (ref 0.0–0.2)

## 2018-03-13 LAB — HEPATITIS PANEL, ACUTE
HCV Ab: <0.1 s/co ratio (ref 0.0–0.9)
HEP B S AG: NEGATIVE
Hep A IgM: NEGATIVE
Hep B C IgM: NEGATIVE

## 2018-03-13 LAB — EPSTEIN-BARR VIRUS (EBV) ANTIBODY PROFILE
EBV NA IgG: 488 U/mL — ABNORMAL HIGH (ref 0.0–17.9)
EBV VCA IgG: 29 U/mL — ABNORMAL HIGH (ref 0.0–17.9)
EBV VCA IgM: <36 U/mL (ref 0.0–35.9)

## 2018-03-13 LAB — CMV ANTIBODY, IGG (EIA): CMV Ab - IgG: >10 U/mL — ABNORMAL HIGH (ref 0.00–0.59)

## 2018-03-13 LAB — CMV IGM: CMV IgM: <30 AU/mL (ref 0.0–29.9)

## 2018-03-13 LAB — URINE CULTURE: Culture: NO GROWTH

## 2018-03-13 LAB — CORTISOL: Cortisol, Plasma: 4.6 ug/dL

## 2018-03-13 LAB — HIV ANTIBODY (ROUTINE TESTING W REFLEX): HIV Screen 4th Generation wRfx: NONREACTIVE

## 2018-03-13 SURGERY — AXILLARY LYMPH NODE BIOPSY
Anesthesia: General | Site: Axilla | Laterality: Right

## 2018-03-13 MED ORDER — FENTANYL CITRATE (PF) 100 MCG/2ML IJ SOLN
INTRAMUSCULAR | Status: AC
  Filled 2018-03-13: qty 4

## 2018-03-13 MED ORDER — LACTATED RINGERS IV SOLN
INTRAVENOUS | Status: DC
  Administered 2018-03-13 (×2): via INTRAVENOUS

## 2018-03-13 MED ORDER — ONDANSETRON HCL 4 MG/2ML IJ SOLN
INTRAMUSCULAR | Status: AC
  Filled 2018-03-13: qty 2

## 2018-03-13 MED ORDER — MIDAZOLAM HCL 5 MG/5ML IJ SOLN
INTRAMUSCULAR | Status: DC | PRN
  Administered 2018-03-13: 2 mg via INTRAVENOUS

## 2018-03-13 MED ORDER — MIDAZOLAM HCL 2 MG/2ML IJ SOLN
INTRAMUSCULAR | Status: AC
  Filled 2018-03-13: qty 2

## 2018-03-13 MED ORDER — FENTANYL CITRATE (PF) 100 MCG/2ML IJ SOLN
INTRAMUSCULAR | Status: DC | PRN
  Administered 2018-03-13 (×2): 50 ug via INTRAVENOUS
  Administered 2018-03-13: 100 ug via INTRAVENOUS
  Administered 2018-03-13: 50 ug via INTRAVENOUS

## 2018-03-13 MED ORDER — 0.9 % SODIUM CHLORIDE (POUR BTL) OPTIME
TOPICAL | Status: DC | PRN
  Administered 2018-03-13: 1000 mL

## 2018-03-13 MED ORDER — ONDANSETRON HCL 4 MG/2ML IJ SOLN
INTRAMUSCULAR | Status: DC | PRN
  Administered 2018-03-13: 4 mg via INTRAVENOUS

## 2018-03-13 MED ORDER — PROMETHAZINE HCL 25 MG/ML IJ SOLN: 6.2500 mg | INTRAMUSCULAR | Status: DC | PRN

## 2018-03-13 MED ORDER — PROPOFOL 10 MG/ML IV BOLUS
INTRAVENOUS | Status: AC
  Filled 2018-03-13: qty 20

## 2018-03-13 MED ORDER — DEXAMETHASONE SODIUM PHOSPHATE 10 MG/ML IJ SOLN
INTRAMUSCULAR | Status: AC
  Filled 2018-03-13: qty 1

## 2018-03-13 MED ORDER — BUPIVACAINE LIPOSOME 1.3 % IJ SUSP
20.0000 mL | Freq: Once | INTRAMUSCULAR | Status: AC
  Administered 2018-03-13: 10 mL
  Filled 2018-03-13: qty 20

## 2018-03-13 MED ORDER — DEXAMETHASONE SODIUM PHOSPHATE 10 MG/ML IJ SOLN
INTRAMUSCULAR | Status: DC | PRN
  Administered 2018-03-13: 10 mg via INTRAVENOUS

## 2018-03-13 MED ORDER — CEFAZOLIN SODIUM-DEXTROSE 2-4 GM/100ML-% IV SOLN
INTRAVENOUS | Status: AC
  Filled 2018-03-13: qty 100

## 2018-03-13 MED ORDER — ROCURONIUM BROMIDE 100 MG/10ML IV SOLN
INTRAVENOUS | Status: AC
  Filled 2018-03-13: qty 1

## 2018-03-13 MED ORDER — DEXTROSE-NACL 5-0.9 % IV SOLN
INTRAVENOUS | Status: DC
  Administered 2018-03-13: 12:00:00 via INTRAVENOUS

## 2018-03-13 MED ORDER — SUCCINYLCHOLINE CHLORIDE 200 MG/10ML IV SOSY
PREFILLED_SYRINGE | INTRAVENOUS | Status: DC | PRN
  Administered 2018-03-13: 120 mg via INTRAVENOUS

## 2018-03-13 MED ORDER — PROPOFOL 10 MG/ML IV BOLUS
INTRAVENOUS | Status: DC | PRN
  Administered 2018-03-13: 170 mg via INTRAVENOUS

## 2018-03-13 MED ORDER — LIDOCAINE 2% (20 MG/ML) 5 ML SYRINGE
INTRAMUSCULAR | Status: AC
  Filled 2018-03-13: qty 5

## 2018-03-13 MED ORDER — FENTANYL CITRATE (PF) 100 MCG/2ML IJ SOLN
25.0000 ug | INTRAMUSCULAR | Status: DC | PRN
  Administered 2018-03-13 (×2): 50 ug via INTRAVENOUS

## 2018-03-13 MED ORDER — LIDOCAINE 2% (20 MG/ML) 5 ML SYRINGE
INTRAMUSCULAR | Status: DC | PRN
  Administered 2018-03-13: 80 mg via INTRAVENOUS

## 2018-03-13 MED ORDER — FENTANYL CITRATE (PF) 250 MCG/5ML IJ SOLN
INTRAMUSCULAR | Status: AC
  Filled 2018-03-13: qty 5

## 2018-03-13 MED ORDER — DIPHENHYDRAMINE HCL 25 MG PO CAPS
25.0000 mg | ORAL_CAPSULE | Freq: Four times a day (QID) | ORAL | Status: DC | PRN
  Administered 2018-03-13 – 2018-03-15 (×3): 25 mg via ORAL
  Filled 2018-03-13 (×3): qty 1

## 2018-03-13 MED ORDER — CEFAZOLIN SODIUM-DEXTROSE 2-4 GM/100ML-% IV SOLN
2.0000 g | INTRAVENOUS | Status: AC
  Administered 2018-03-13: 2 g via INTRAVENOUS

## 2018-03-13 MED ORDER — OXYCODONE HCL 5 MG PO TABS: 5.0000 mg | ORAL_TABLET | Freq: Four times a day (QID) | ORAL | Status: DC | PRN

## 2018-03-13 SURGICAL SUPPLY — 34 items
BANDAGE ACE 6X5 VEL STRL LF (GAUZE/BANDAGES/DRESSINGS) IMPLANT
BENZOIN TINCTURE PRP APPL 2/3 (GAUZE/BANDAGES/DRESSINGS) ×2 IMPLANT
BLADE HEX COATED 2.75 (ELECTRODE) ×2 IMPLANT
BLADE SURG 15 STRL LF DISP TIS (BLADE) ×3 IMPLANT
BLADE SURG 15 STRL SS (BLADE) ×3
COVER SURGICAL LIGHT HANDLE (MISCELLANEOUS) ×2 IMPLANT
COVER WAND RF STERILE (DRAPES) IMPLANT
DECANTER SPIKE VIAL GLASS SM (MISCELLANEOUS) ×2 IMPLANT
DERMABOND ADVANCED (GAUZE/BANDAGES/DRESSINGS) ×1
DERMABOND ADVANCED .7 DNX12 (GAUZE/BANDAGES/DRESSINGS) IMPLANT
DRAPE LAPAROSCOPIC ABDOMINAL (DRAPES) ×2 IMPLANT
DRSG TEGADERM 4X4.75 (GAUZE/BANDAGES/DRESSINGS) ×2 IMPLANT
ELECT PENCIL ROCKER SW 15FT (MISCELLANEOUS) ×2 IMPLANT
ELECT REM PT RETURN 15FT ADLT (MISCELLANEOUS) ×2 IMPLANT
GAUZE 4X4 16PLY RFD (DISPOSABLE) ×2 IMPLANT
GAUZE SPONGE 4X4 12PLY STRL (GAUZE/BANDAGES/DRESSINGS) ×1 IMPLANT
GLOVE BIO SURGEON STRL SZ7 (GLOVE) ×3 IMPLANT
GLOVE BIOGEL PI IND STRL 7.0 (GLOVE) ×1 IMPLANT
GLOVE BIOGEL PI IND STRL 7.5 (GLOVE) ×2 IMPLANT
GLOVE BIOGEL PI INDICATOR 7.0 (GLOVE) ×4
GLOVE BIOGEL PI INDICATOR 7.5 (GLOVE) ×2
GOWN STRL REUS W/TWL LRG LVL3 (GOWN DISPOSABLE) ×4 IMPLANT
GOWN STRL REUS W/TWL XL LVL3 (GOWN DISPOSABLE) ×5 IMPLANT
KIT BASIN OR (CUSTOM PROCEDURE TRAY) ×2 IMPLANT
MARKER SKIN DUAL TIP RULER LAB (MISCELLANEOUS) ×2 IMPLANT
NEEDLE HYPO 22GX1.5 SAFETY (NEEDLE) ×1 IMPLANT
PACK BASIC VI WITH GOWN DISP (CUSTOM PROCEDURE TRAY) ×2 IMPLANT
STRIP CLOSURE SKIN 1/2X4 (GAUZE/BANDAGES/DRESSINGS) ×2 IMPLANT
SUT MNCRL AB 4-0 PS2 18 (SUTURE) ×1 IMPLANT
SUT VIC AB 3-0 SH 27 (SUTURE) ×1
SUT VIC AB 3-0 SH 27XBRD (SUTURE) ×1 IMPLANT
SUT VIC AB 4-0 SH 18 (SUTURE) ×1 IMPLANT
SYR CONTROL 10ML LL (SYRINGE) ×2 IMPLANT
YANKAUER SUCT BULB TIP 10FT TU (MISCELLANEOUS) IMPLANT

## 2018-03-13 NOTE — Consult Note (Signed)
[facility]    Date of Admission:  03/12/2018          Reason for Consult: Unexplained fever associated with lymphadenopathy, anemia and elevated liver enzymes    Referring Provider: Dr. Estill Cotta  Assessment: I am certainly concerned about the possibility of lymphoma but would recommend submitting the lymph node biopsy for AFB and fungal cultures as well.  We will follow with you  Plan: 1. Lymph node biopsy with specimen sent for pathology, AFB and fungal cultures 2. Observe off of antibiotics  Principal Problem:   FUO (fever of unknown origin) Active Problems:   Lymphadenopathy, axillary   Normocytic anemia   Elevated liver enzymes   Rheumatoid arthritis (HCC)   Scheduled Meds: . bupivacaine liposome  20 mL Infiltration Once  . [MAR Hold] hydroxychloroquine  400 mg Oral Daily  . [MAR Hold] predniSONE  5 mg Oral QAC breakfast  . [MAR Hold] sulfaSALAzine  500 mg Oral BID   Continuous Infusions: . ceFAZolin    . [MAR Hold]  ceFAZolin (ANCEF) IV    . dextrose 5 % and 0.9% NaCl 100 mL/hr at 03/13/18 1134  . lactated ringers Stopped (03/13/18 1009)  . lactated ringers 50 mL/hr at 03/13/18 1329   PRN Meds:.[MAR Hold] acetaminophen **OR** [MAR Hold] acetaminophen, [MAR Hold] ketorolac  HPI: Erin Clay is a 38 y.o. female who was diagnosed with rheumatoid arthritis several months ago.  She has been on prednisone, Plaquenil and sulfasalazine.  Several weeks ago she began to have fever, itching, and facial swelling.  She was seen in the emergency department 4 days ago and given amoxicillin for a presumed URI.  She returned yesterday and was admitted.  She has had fever up to 103 degrees.  She has developed normocytic anemia and elevated liver enzymes over the last several months.  Exam and CT scan reveal lymphadenopathy.  Her serum LDH is quite elevated at 702.  Blood cultures remain negative.  Viral hepatitis studies are negative.  She has  no evidence of pneumonia or a urinary tract infection.  She is scheduled for lymph node biopsy later this afternoon   Review of Systems: Review of Systems  Unable to perform ROS: Other  Constitutional:       She is currently out of her room for her biopsy.    History reviewed. No pertinent past medical history.  Social History   Tobacco Use  . Smoking status: Never Smoker  . Smokeless tobacco: Never Used  Substance Use Topics  . Alcohol use: No  . Drug use: No    No family history on file. No Known Allergies  OBJECTIVE: Blood pressure 115/73, pulse (!) 108, temperature 99.1 F (37.3 C), temperature source Oral, resp. rate 16, height 5\' 7"  (1.702 m), weight 80.3 kg, last menstrual period 02/26/2018, SpO2 100 %.  Physical Exam Constitutional:      Comments: She is currently out of her room for her biopsy so I was not able to examine her.     Lab Results Lab Results  Component Value Date   WBC 15.9 (H) 03/13/2018   HGB 11.2 (L) 03/13/2018   HCT 37.5 03/13/2018   MCV 87.2 03/13/2018   PLT 269 03/13/2018    Lab Results  Component Value Date   CREATININE 0.47 03/13/2018   BUN <5 (L) 03/13/2018   NA 139 03/13/2018   K 4.7 03/13/2018   CL 107 03/13/2018   CO2  22 03/13/2018    Lab Results  Component Value Date   ALT 285 (H) 03/13/2018   AST 192 (H) 03/13/2018   ALKPHOS 151 (H) 03/13/2018   BILITOT 0.8 03/13/2018     Microbiology: Recent Results (from the past 240 hour(s))  Blood Culture (routine x 2)     Status: None (Preliminary result)   Collection Time: 03/09/18  8:42 AM  Result Value Ref Range Status   Specimen Description BLOOD RIGHT ANTECUBITAL  Final   Special Requests   Final    BOTTLES DRAWN AEROBIC AND ANAEROBIC Blood Culture adequate volume   Culture   Final    NO GROWTH 4 DAYS Performed at [facility], [place] [address], [place], [place] [address]    Report Status PENDING  Incomplete  Blood Culture (routine x 2)     Status: None  (Preliminary result)   Collection Time: 03/09/18  8:55 AM  Result Value Ref Range Status   Specimen Description BLOOD RIGHT HAND  Final   Special Requests   Final    BOTTLES DRAWN AEROBIC AND ANAEROBIC Blood Culture adequate volume   Culture   Final    NO GROWTH 4 DAYS Performed at [facility], [place] [address], [place], [place] [address]    Report Status PENDING  Incomplete  Group A Strep by PCR     Status: None   Collection Time: 03/09/18 12:10 PM  Result Value Ref Range Status   Group A Strep by PCR NOT DETECTED NOT DETECTED Final    Comment: Performed at [facility], [place] [address] [place], [place] [address]  Culture, blood (routine x 2)     Status: None (Preliminary result)   Collection Time: 03/12/18  9:06 AM  Result Value Ref Range Status   Specimen Description   Final    BLOOD LEFT ANTECUBITAL Performed at [facility] [address], [place], [place] [address]    Special Requests   Final    BOTTLES DRAWN AEROBIC AND ANAEROBIC Blood Culture results may not be optimal due to an excessive volume of blood received in culture bottles Performed at [facility] [address] [place], [place] [address]    Culture   Final    NO GROWTH < 24 HOURS Performed at [facility] [address] [place], [place] [address]    Report Status PENDING  Incomplete  Culture, blood (routine x 2)     Status: None (Preliminary result)   Collection Time: 03/12/18  9:45 AM  Result Value Ref Range Status   Specimen Description   Final    BLOOD RIGHT ANTECUBITAL Performed at [facility] [address], [place], [place] [address]    Special Requests   Final    BOTTLES DRAWN AEROBIC AND ANAEROBIC Blood Culture adequate volume Performed at [facility] [address], [place], [place] [address]    Culture   Final    NO GROWTH < 24 HOURS Performed at [facility] [address],  [place], [place] [address]    Report Status PENDING  Incomplete  Culture, Urine     Status: None   Collection Time: 03/12/18 11:42 AM  Result Value Ref Range Status   Specimen Description   Final    URINE, CLEAN CATCH Performed at [facility], [place] [address] [place], [place] [address]    Special Requests   Final  NONE Performed at [facility], [place] [address], [place], [place] [address]    Culture   Final    NO GROWTH Performed at [facility], [place] [address] [place], [place] [address]    Report Status 03/13/2018 FINAL  Final    Michel Bickers, MD [facility] [phone] pager   [phone] cell 03/13/2018, 3:20 PM

## 2018-03-13 NOTE — H&P (View-Only) (Signed)
Reason for Consult: Requesting axillary lymph node biopsy Referring Physician: Dr. Tyler Pita  Erin Clay is an 38 y.o. female.   HPI: Patient is a 38 year old female who was seen by PCP on 03/09/2018 complaining of fever and lymphadenopathy reported the face is swollen tender lymph nodes in her neck.  She is having episodes of feeling hot and cold.  On evaluation in the ED her temperature was 103.  She reports she was last in [place] 4 months ago.  She was discharged with a diagnosis of fever, unspecified fever cause influenza-like illness, she was treated with Augmentin at discharge.  Yesterday she returned to the ED planing of swollen lymph nodes, shortness of breath, facial swelling, dizziness, itching and a dry mouth for 2 weeks.  Patient was crying at and concerned.  She was seen by her PCP on 2/10 where they told her everything was okay.  She was very frightened about what was occurring On evaluation yesterday she was noted to be on low-dose prednisone, Plaquenil and sulfasalazine for rheumatoid arthritis.  She also complained of itching.  She was in [place] for about a month and returned back in October 2019, to the [place].  Work-up so far shows 1 low-grade temperature 100.4.  Some tachycardia that is improving.  She was put on nasal cannula last evening.  Admission labs yesterday shows an elevated glucose of 113, calcium 8.6, alk phos of 151 AST 192, ALT 285.  Total bilirubin is 0.8.  WBC is 15.9, hemoglobin 11.2, hematocrit 37.5, platelets 269,000.  Urinalysis is unremarkable.  Lung volumes with mild atelectasis.  CT of the abdomen with contrast was essentially unremarkable. Minimal sludge in the gallbladder seen on the prior ultrasound exam but no other acute findings.  This was followed by a CT angios with chest PE protocol.  This was negative for pulmonary embolism.  Mediastinal nodes were nonspecifically enlarged bilateral axillary lymph nodes largest in the right axilla measuring 2.8 cm.  Thyroid  trachea and esophagus were normal lungs were normal.  The only finding was the nonspecific enlarged bilateral axillary lymph nodes.  We are asked to see for possible biopsy of the axillary lymph node. History reviewed. No pertinent past medical history.  History reviewed. No pertinent surgical history.  No family history on file.  Social History:  reports that she has never smoked. She has never used smokeless tobacco. She reports that she does not drink alcohol or use drugs.  Allergies: No Known Allergies  In addition to the prescribed medication she is on several over-the-counter/family remedies for her arthritis, that are not listed. . Prior to Admission medications   Medication Sig Start Date End Date Taking? Authorizing Provider  acetaminophen (TYLENOL) 500 MG tablet Take 1,000 mg by mouth every 6 (six) hours as needed for mild pain or headache.   Yes [provider]  amoxicillin-clavulanate (AUGMENTIN) 875-125 MG tablet Take 1 tablet by mouth every 12 (twelve) hours. 03/09/18  Yes Hayden Rasmussen, MD  cetirizine (ZYRTEC) 10 MG tablet Take 10 mg by mouth daily as needed for allergies (itching).    Yes [provider]  cholecalciferol (VITAMIN D3) 25 MCG (1000 UT) tablet Take 1,000 Units by mouth daily.   Yes [provider]  Ferrous Sulfate (IRON PO) Take 1 tablet by mouth daily.   Yes [provider]  glucosamine-chondroitin 500-400 MG tablet Take 2 tablets by mouth daily.    Yes [provider]  hydroxychloroquine (PLAQUENIL) 200 MG tablet Take 400 mg by  mouth daily. 02/17/18  Yes [provider]  ibuprofen (ADVIL,MOTRIN) 200 MG tablet Take 400 mg by mouth every 6 (six) hours as needed for headache or moderate pain.   Yes [provider]  meclizine (ANTIVERT) 32 MG tablet Take 1 tablet (32 mg total) by mouth 3 (three) times daily as needed. Patient taking differently: Take 32 mg by mouth 3 (three) times daily as needed  for dizziness.  04/13/16  Yes Volanda Napoleon, PA-C  Multiple Vitamins-Minerals (MULTIVITAMIN WITH MINERALS) tablet Take 1 tablet by mouth daily.   Yes [provider]  predniSONE (DELTASONE) 5 MG tablet Take 5 mg by mouth daily.  02/04/18  Yes [provider]  sulfaSALAzine (AZULFIDINE) 500 MG EC tablet Take 500 mg by mouth 2 (two) times daily. 02/25/18  Yes [provider]     Results for orders placed or performed during the hospital encounter of 03/12/18 (from the past 48 hour(s))  CBG monitoring, ED     Status: None   Collection Time: 03/12/18  7:57 AM  Result Value Ref Range   Glucose-Capillary 86 70 - 99 mg/dL  CBC with Differential     Status: Abnormal   Collection Time: 03/12/18  8:44 AM  Result Value Ref Range   WBC 10.9 (H) 4.0 - 10.5 K/uL   RBC 4.34 3.87 - 5.11 MIL/uL   Hemoglobin 11.4 (L) 12.0 - 15.0 g/dL   HCT 36.9 36.0 - 46.0 %   MCV 85.0 80.0 - 100.0 fL   MCH 26.3 26.0 - 34.0 pg   MCHC 30.9 30.0 - 36.0 g/dL   RDW 15.6 (H) 11.5 - 15.5 %   Platelets 245 150 - 400 K/uL   nRBC 0.2 0.0 - 0.2 %   Neutrophils Relative % 42 %   Neutro Abs 4.6 1.7 - 7.7 K/uL   Lymphocytes Relative 40 %   Lymphs Abs 4.4 (H) 0.7 - 4.0 K/uL   Monocytes Relative 11 %   Monocytes Absolute 1.2 (H) 0.1 - 1.0 K/uL   Eosinophils Relative 4 %   Eosinophils Absolute 0.4 0.0 - 0.5 K/uL   Basophils Relative 1 %   Basophils Absolute 0.1 0.0 - 0.1 K/uL   Immature Granulocytes 2 %   Abs Immature Granulocytes 0.17 (H) 0.00 - 0.07 K/uL   Polychromasia PRESENT     Comment: Performed at [facility], [place] [address], [place], [place] [address]  Comprehensive metabolic panel     Status: Abnormal   Collection Time: 03/12/18  8:44 AM  Result Value Ref Range   Sodium 136 135 - 145 mmol/L   Potassium 2.9 (L) 3.5 - 5.1 mmol/L   Chloride 103 98 - 111 mmol/L   CO2 23 22 - 32 mmol/L   Glucose, Bld 90 70 - 99 mg/dL   BUN <5 (L) 6 - 20 mg/dL   Creatinine, Ser 0.60  0.44 - 1.00 mg/dL   Calcium 8.5 (L) 8.9 - 10.3 mg/dL   Total Protein 6.8 6.5 - 8.1 g/dL   Albumin 3.4 (L) 3.5 - 5.0 g/dL   AST 157 (H) 15 - 41 U/L   ALT 226 (H) 0 - 44 U/L   Alkaline Phosphatase 150 (H) 38 - 126 U/L   Total Bilirubin 1.0 0.3 - 1.2 mg/dL   GFR calc non Af Amer >60 >60 mL/min   GFR calc Af Amer >60 >60 mL/min   Anion gap 10 5 - 15    Comment: Performed at [facility], [place] [address], [place], [place] [address]  HIV antibody     Status: None   Collection Time: 03/12/18  8:44 AM  Result Value Ref Range   HIV Screen 4th Generation wRfx Non Reactive Non Reactive    Comment: (NOTE) Performed At: [facility] [place], [place] [id number] Rush Farmer MD RC:[id number]   TSH     Status: None   Collection Time: 03/12/18  8:44 AM  Result Value Ref Range   TSH 1.964 0.350 - 4.500 uIU/mL    Comment: Performed by a 3rd Generation assay with a functional sensitivity of <=0.01 uIU/mL. Performed at [facility], [place] [address], [place], [place] [address]   Magnesium     Status: None   Collection Time: 03/12/18  8:44 AM  Result Value Ref Range   Magnesium 2.3 1.7 - 2.4 mg/dL    Comment: Performed at [facility], [place] [address] [place], [place] [address]  Culture, blood (routine x 2)     Status: None (Preliminary result)   Collection Time: 03/12/18  9:06 AM  Result Value Ref Range   Specimen Description      BLOOD LEFT ANTECUBITAL Performed at [place] [address], [place], [place] [address]    Special Requests      BOTTLES DRAWN AEROBIC AND ANAEROBIC Blood Culture results may not be optimal due to an excessive volume of blood received in culture bottles Performed at [place] [address], [place], [place] [address]    Culture      NO GROWTH < 24 HOURS Performed at [place] [address], [place], [place] [address]    Report  Status PENDING   I-Stat beta hCG blood, ED     Status: None   Collection Time: 03/12/18  9:11 AM  Result Value Ref Range   I-stat hCG, quantitative <5.0 <5 mIU/mL   Comment 3            Comment:   GEST. AGE      CONC.  (mIU/mL)   <=1 WEEK        5 - 50     2 WEEKS       50 - 500     3 WEEKS       100 - 10,000     4 WEEKS     1,000 - 30,000        FEMALE AND NON-PREGNANT FEMALE:     LESS THAN 5 mIU/mL   Culture, blood (routine x 2)     Status: None (Preliminary result)   Collection Time: 03/12/18  9:45 AM  Result Value Ref Range   Specimen Description      BLOOD RIGHT ANTECUBITAL Performed at [facility], [place] [address] [place], [place] [address]    Special Requests      BOTTLES DRAWN AEROBIC AND ANAEROBIC Blood Culture adequate volume Performed at [place] [address], [place], [place] [address]    Culture      NO GROWTH < 24 HOURS Performed at [place] [address] [place], [place] [address]    Report Status PENDING   Urinalysis, Routine w reflex microscopic     Status: Abnormal   Collection Time: 03/12/18 11:42 AM  Result Value Ref Range   Color, Urine YELLOW YELLOW   APPearance HAZY (A) CLEAR   Specific Gravity, Urine 1.006 1.005 -  1.030   pH 6.0 5.0 - 8.0   Glucose, UA NEGATIVE NEGATIVE mg/dL   Hgb urine dipstick NEGATIVE NEGATIVE   Bilirubin Urine NEGATIVE NEGATIVE   Ketones, ur 20 (A) NEGATIVE mg/dL   Protein, ur NEGATIVE NEGATIVE mg/dL   Nitrite NEGATIVE NEGATIVE   Leukocytes,Ua TRACE (A) NEGATIVE   RBC / HPF 0-5 0 - 5 RBC/hpf   WBC, UA 0-5 0 - 5 WBC/hpf   Bacteria, UA FEW (A) NONE SEEN   Squamous Epithelial / LPF 6-10 0 - 5    Comment: Performed at [facility], [place] [address], [place], [place] [address]  Lactic acid, plasma     Status: Abnormal   Collection Time: 03/12/18  2:23 PM  Result Value Ref Range   Lactic Acid, Venous 2.0 (HH) 0.5 - 1.9 mmol/L    Comment: CRITICAL  RESULT CALLED TO, READ BACK BY AND VERIFIED WITH: M.BRILL AT 1523 ON 03/12/18 BY N.THOMPSON Performed at [facility], [place] [address], [place], [place] [address]   Lactic acid, plasma     Status: Abnormal   Collection Time: 03/12/18  7:13 PM  Result Value Ref Range   Lactic Acid, Venous 2.1 (HH) 0.5 - 1.9 mmol/L    Comment: CRITICAL RESULT CALLED TO, READ BACK BY AND VERIFIED WITH: T.RYAN AT 1950 ON 03/12/18 BY N.THOMPSON Performed at [facility], [place] [address], [place], [place] [address]   C-reactive protein     Status: Abnormal   Collection Time: 03/12/18  7:13 PM  Result Value Ref Range   CRP 7.6 (H) <1.0 mg/dL    Comment: Performed at [facility], [place] [address], [place], [place] [address]  Hepatitis panel, acute     Status: None   Collection Time: 03/12/18  7:13 PM  Result Value Ref Range   Hepatitis B Surface Ag Negative Negative   HCV Ab <0.1 0.0 - 0.9 s/co ratio    Comment: (NOTE)                                  Negative:     < 0.8                             Indeterminate: 0.8 - 0.9                                  Positive:     > 0.9 The CDC recommends that a positive HCV antibody result be followed up with a HCV Nucleic Acid Amplification test (681157). Performed At: [facility] [place], [place] [id number] Rush Farmer MD CB:[id number]    Hep A IgM Negative Negative   Hep B C IgM Negative Negative  CMV IgM     Status: None   Collection Time: 03/12/18  7:13 PM  Result Value Ref Range   CMV IgM <30.0 0.0 - 29.9 AU/mL    Comment: (NOTE)                                Negative         <30.0  Equivocal  30.0 - 34.9                                Positive         >34.9 A positive result is generally indicative of acute infection, reactivation or persistent IgM production. Performed At: [facility] [place], [place]  [id number] Rush Farmer MD JG:[id number]   Cmv antibody, IgG (EIA)     Status: Abnormal   Collection Time: 03/12/18  7:13 PM  Result Value Ref Range   CMV Ab - IgG >10.00 (H) 0.00 - 0.59 U/mL    Comment: (NOTE)                               Negative          <0.60                               Equivocal   0.60 - 0.69                               Positive          >0.69 Performed At: [facility] [place], [place] [id number] Rush Farmer MD [id number]   Lactate dehydrogenase     Status: Abnormal   Collection Time: 03/12/18  8:46 PM  Result Value Ref Range   LDH 702 (H) 98 - 192 U/L    Comment: Performed at [facility], [place] [address] [place], [place] [address]  Sedimentation rate     Status: None   Collection Time: 03/12/18  8:46 PM  Result Value Ref Range   Sed Rate 12 0 - 22 mm/hr    Comment: Performed at [facility], [place] [address], [place], [place] [address]  CBC with Differential/Platelet     Status: Abnormal (Preliminary result)   Collection Time: 03/13/18  6:22 AM  Result Value Ref Range   WBC 15.9 (H) 4.0 - 10.5 K/uL   RBC 4.30 3.87 - 5.11 MIL/uL   Hemoglobin 11.2 (L) 12.0 - 15.0 g/dL   HCT 37.5 36.0 - 46.0 %   MCV 87.2 80.0 - 100.0 fL   MCH 26.0 26.0 - 34.0 pg   MCHC 29.9 (L) 30.0 - 36.0 g/dL   RDW 15.9 (H) 11.5 - 15.5 %   Platelets 269 150 - 400 K/uL   nRBC 0.1 0.0 - 0.2 %    Comment: Performed at [facility], [place] [address] [place], [place] [address]   Neutrophils Relative % PENDING %   Neutro Abs PENDING 1.7 - 7.7 K/uL   Band Neutrophils PENDING %   Lymphocytes Relative PENDING %   Lymphs Abs PENDING 0.7 - 4.0 K/uL   Monocytes Relative PENDING %   Monocytes Absolute PENDING 0.1 - 1.0 K/uL   Eosinophils Relative PENDING %   Eosinophils Absolute PENDING 0.0 - 0.5 K/uL   Basophils Relative PENDING %   Basophils Absolute PENDING 0.0 - 0.1 K/uL   WBC Morphology PENDING     RBC Morphology PENDING    Smear Review PENDING    Other PENDING %   nRBC PENDING 0 /100 WBC   Metamyelocytes Relative PENDING %   Myelocytes PENDING %  Promyelocytes Relative PENDING %   Blasts PENDING %  Comprehensive metabolic panel     Status: Abnormal   Collection Time: 03/13/18  6:22 AM  Result Value Ref Range   Sodium 139 135 - 145 mmol/L   Potassium 4.7 3.5 - 5.1 mmol/L    Comment: DELTA CHECK NOTED REPEATED TO VERIFY SLIGHT HEMOLYSIS    Chloride 107 98 - 111 mmol/L   CO2 22 22 - 32 mmol/L   Glucose, Bld 113 (H) 70 - 99 mg/dL   BUN <5 (L) 6 - 20 mg/dL   Creatinine, Ser 0.47 0.44 - 1.00 mg/dL   Calcium 8.6 (L) 8.9 - 10.3 mg/dL   Total Protein 5.8 (L) 6.5 - 8.1 g/dL   Albumin 2.9 (L) 3.5 - 5.0 g/dL   AST 192 (H) 15 - 41 U/L   ALT 285 (H) 0 - 44 U/L   Alkaline Phosphatase 151 (H) 38 - 126 U/L   Total Bilirubin 0.8 0.3 - 1.2 mg/dL   GFR calc non Af Amer >60 >60 mL/min   GFR calc Af Amer >60 >60 mL/min   Anion gap 10 5 - 15    Comment: Performed at [facility], [place] [address] [place], [place] [address]    Dg Chest 2 View  Result Date: 03/12/2018 CLINICAL DATA:  Dyspnea and fever EXAM: CHEST - 2 VIEW COMPARISON:  Three days ago FINDINGS: Mildly low lung volumes with interstitial crowding. There is no edema, consolidation, effusion, or pneumothorax. Normal heart size and mediastinal contours. IMPRESSION: Low volume chest with mild atelectasis. Electronically Signed   By: Monte Fantasia M.D.   On: 03/12/2018 10:37   Ct Angio Chest Pe W And/or Wo Contrast  Result Date: 03/12/2018 CLINICAL DATA:  PE suspected EXAM: CT ANGIOGRAPHY CHEST WITH CONTRAST TECHNIQUE: Multidetector CT imaging of the chest was performed using the standard protocol during bolus administration of intravenous contrast. Multiplanar CT image reconstructions and MIPs were obtained to evaluate the vascular anatomy. CONTRAST:  145m ISOVUE-370 IOPAMIDOL (ISOVUE-370) INJECTION 76%  COMPARISON:  Same day chest radiograph FINDINGS: Cardiovascular: Satisfactory opacification of the pulmonary arteries to the segmental level. No evidence of pulmonary embolism. Normal heart size. No pericardial effusion. Mediastinum/Nodes: Non-specific enlarged bilateral axillary lymph nodes, largest nodes in the right axilla measuring 2.8 cm. Thyroid gland, trachea, and esophagus demonstrate no significant findings. Lungs/Pleura: Bibasilar scarring or atelectasis. No pleural effusion or pneumothorax. Upper Abdomen: No acute abnormality. Musculoskeletal: No chest wall abnormality. No acute or significant osseous findings. Review of the MIP images confirms the above findings. IMPRESSION: 1.  Negative examination for pulmonary embolism. 2. Bibasilar scarring or atelectasis. 3. Non-specific enlarged bilateral axillary lymph nodes, largest nodes in the right axilla measuring 2.8 cm. Electronically Signed   By: AEddie CandleM.D.   On: 03/12/2018 15:55   Ct Abdomen Pelvis W Contrast  Result Date: 03/12/2018 CLINICAL DATA:  Fever of unknown origin EXAM: CT ABDOMEN AND PELVIS WITH CONTRAST TECHNIQUE: Multidetector CT imaging of the abdomen and pelvis was performed using the standard protocol following bolus administration of intravenous contrast. CONTRAST:  1047mISOVUE-370 IOPAMIDOL (ISOVUE-370) INJECTION 76% COMPARISON:  Right upper quadrant ultrasound, 03/09/2018. FINDINGS: Lower chest: Please see separately reported CT examination of the chest. Hepatobiliary: No focal liver abnormality is seen. Minimal sludge in the dependent gallbladder. No gallbladder wall thickening, or biliary dilatation. Pancreas: Unremarkable. No pancreatic ductal dilatation or surrounding inflammatory changes. Spleen: Normal in size without focal abnormality. Adrenals/Urinary Tract: Adrenal glands are unremarkable. Kidneys are  normal, without renal calculi, focal lesion, or hydronephrosis. Bladder is unremarkable. Stomach/Bowel: Stomach is  within normal limits. Appendix appears normal. No evidence of bowel wall thickening, distention, or inflammatory changes. Vascular/Lymphatic: No significant vascular findings are present. No enlarged abdominal or pelvic lymph nodes. Reproductive: No mass or other abnormality. Other: No abdominal wall hernia or abnormality. No abdominopelvic ascites. Musculoskeletal: No acute or significant osseous findings. IMPRESSION: 1. Minimal sludge in the dependent gallbladder, as seen on prior ultrasound examination. No acute findings. 2. No acute CT findings in the abdomen or pelvis to explain fever. Normal appendix. Electronically Signed   By: Eddie Candle M.D.   On: 03/12/2018 16:00    Review of Systems  Constitutional: Positive for chills and fever.  HENT: Negative.        Patient complains of facial swelling, bilateral mandibular lymphadenopathy, left supraclavicular lymphadenopathy, and axillary lynphadenopathy  Eyes: Negative.   Respiratory: Positive for shortness of breath. Negative for cough, hemoptysis, sputum production and wheezing.   Cardiovascular: Positive for palpitations. Negative for chest pain, orthopnea, claudication, leg swelling and PND.  Gastrointestinal: Negative.   Genitourinary: Negative.   Musculoskeletal: Positive for joint pain. Negative for back pain, falls, myalgias and neck pain.       She has a history of rheumatoid arthritis that affected her right ankle her fingers her knees and shoulders.  Has been on Plaquenil for 2 months and those symptoms have essentially resolved.  She is followed by Dr. Jenetta Downer  Skin: Negative.   Neurological: Negative.   Endo/Heme/Allergies: Negative.   Psychiatric/Behavioral: The patient is nervous/anxious (She is very anxious and nervous.  Very fearful of what this might be.).    Blood pressure (!) 95/59, pulse 85, temperature 97.9 F (36.6 C), temperature source Oral, resp. rate 16, height '5\' 7"'$  (1.702 m), weight 80.3 kg, last menstrual period  02/26/2018, SpO2 99 %. Physical Exam  Constitutional: She is oriented to person, place, and time. She appears well-developed and well-nourished. No distress.  Aside from her anxiety level she is in no acute distress.  HENT:  Head: Normocephalic and atraumatic.  Mouth/Throat: Oropharynx is clear and moist. No oropharyngeal exudate.  She has bilateral mandibular lymphadenopathy that is palpable.  She also complains of facial swelling that became apparent with the onset of lymphadenopathy.  Eyes: Right eye exhibits no discharge. Left eye exhibits no discharge. No scleral icterus.  Pupils are equal  Neck: Normal range of motion. Neck supple. No JVD present. No tracheal deviation present. No thyromegaly present.  Cardiovascular: Regular rhythm, normal heart sounds and intact distal pulses.  No murmur heard. She is somewhat tachycardic.  Respiratory: Effort normal and breath sounds normal. No respiratory distress. She has no wheezes. She has no rales. She exhibits no tenderness.  She has a left supraclavicular lymph node that is palpable.  She also has lymph nodes on the right and left axilla.  The one on the right axilla was mentioned on the CT scan but the one on the left is also very palpable.  All of these nodes feel like they are about a centimeter or larger.  GI: Soft. Bowel sounds are normal. She exhibits no distension. There is no abdominal tenderness. There is no rebound and no guarding.  Musculoskeletal:        General: No tenderness or edema.  Lymphadenopathy:    She has no cervical adenopathy.  Neurological: She is alert and oriented to person, place, and time. No cranial nerve deficit.  Skin: Skin is  warm and dry. No rash noted. She is not diaphoretic. No erythema. No pallor.  Psychiatric: She has a normal mood and affect. Her behavior is normal. Judgment and thought content normal.    Assessment/Plan: Fever Facial swelling with new lymphadenopathy Mild leukocytosis/LFT  elevation. Rheumatoid arthritis -on Plaquenil, sulfasalazine, prednisone Recent travel to [place]  Plan: Tentatively posted her for lymph node biopsy by Dr. Hassell Done later today.  I have posted her for the right side but the left side actually feels a little larger to me, and I will let Dr. Hassell Done make a final decision on which side biopsy.  Blythe Hartshorn 03/13/2018, 10:53 AM

## 2018-03-13 NOTE — Anesthesia Preprocedure Evaluation (Addendum)
Anesthesia Evaluation  Patient identified by MRN, date of birth, ID band Patient awake    Reviewed: Allergy & Precautions, NPO status , Patient's Chart, lab work & pertinent test results  Airway Mallampati: II  TM Distance: >3 FB Neck ROM: Full    Dental  (+) Dental Advisory Given   Pulmonary neg pulmonary ROS,    breath sounds clear to auscultation       Cardiovascular negative cardio ROS   Rhythm:Regular Rate:Normal     Neuro/Psych negative neurological ROS     GI/Hepatic negative GI ROS, Elevated LFTs   Endo/Other  negative endocrine ROS  Renal/GU negative Renal ROS     Musculoskeletal  (+) Arthritis ,   Abdominal   Peds  Hematology  (+) anemia ,   Anesthesia Other Findings Fever of unknown origin, lymphadenopathy and leukocytosis.  Reproductive/Obstetrics                             Lab Results  Component Value Date   WBC 15.9 (H) 03/13/2018   HGB 11.2 (L) 03/13/2018   HCT 37.5 03/13/2018   MCV 87.2 03/13/2018   PLT 269 03/13/2018   Lab Results  Component Value Date   CREATININE 0.47 03/13/2018   BUN <5 (L) 03/13/2018   NA 139 03/13/2018   K 4.7 03/13/2018   CL 107 03/13/2018   CO2 22 03/13/2018    Anesthesia Physical Anesthesia Plan  ASA: II  Anesthesia Plan: General   Post-op Pain Management:    Induction: Intravenous  PONV Risk Score and Plan: 3 and Dexamethasone, Ondansetron and Treatment may vary due to age or medical condition  Airway Management Planned: Oral ETT  Additional Equipment:   Intra-op Plan:   Post-operative Plan: Extubation in OR  Informed Consent: I have reviewed the patients History and Physical, chart, labs and discussed the procedure including the risks, benefits and alternatives for the proposed anesthesia with the patient or authorized representative who has indicated his/her understanding and acceptance.     Dental advisory  given  Plan Discussed with: CRNA  Anesthesia Plan Comments:        Anesthesia Quick Evaluation

## 2018-03-13 NOTE — Progress Notes (Signed)
[facility]                                                                              Patient Demographics  Erin Clay, is a 38 y.o. female, DOB - 10/22/1980, [place] date - 03/12/2018   Admitting Physician A Melven Sartorius., MD  Outpatient Primary MD for the patient is Patient, No Pcp Per  Outpatient specialists:   LOS - 0  days   Medical records reviewed and are as summarized below:    Chief Complaint  Patient presents with  . Facial Swelling  . Dizziness  . Weakness  . Pruritis       Brief summary   Patient is a 38 year old female with history of rheumatoid arthritis on Plaquenil, sulfasalazine, prednisone, diagnosed in December 2019 presented to ED with fevers, shortness of breath, malaise and lymphadenopathy.  History was obtained from the patient who noted travel to [place] for a month and returned in October, 2019.  Subsequently she she had a joint pains, saw rheumatology and was diagnosed with rheumatoid arthritis.  In the last 2 weeks, patient started having fevers, high up to 103 F, also noticed swollen lymph nodes in her neck area, decreased appetite.  In the last 3 days patient started having significant shortness of breath, felt her face was swollen.  She felt shortness of breath worsening with exertion otherwise denied any rash or abdominal pain, no sick contacts.  Per patient, she did not travel to remote places encounter, stated in the city and ate in the restaurants (mostly fine dining).    Assessment & Plan    Principal Problem: Dyspnea with hypoxia (HCC) with fever of unknown origin -Unclear etiology CT angiogram of the chest negative for PE -Complete work-up for FUO, negative flu on 2/10, blood cultures 2/10- so far, HIV negative, CMV IgG elevated but IgM negative.  EBV pending.  TSH normal -Obtain 2D echocardiogram to rule out cardiomyopathy (?  Viral pericarditis) -Even though she has rheumatoid arthritis, low  suspicion of Felty syndrome given no neutropenia or splenomegaly -CT chest showed axillary lymph nodes, requested surgery for excisional biopsy of the axillary lymph node (rule out lymphoma) -CRP 7.6, ESR normal, LDH 702, currently no wheezing, requested infectious   consult, discussed with Dr. Bridget Hartshorn  Active Problems:  Transaminitis -Unclear etiology, HIV, hepatitis panel negative, EBV pending -Ultrasound abdomen showed mild sludge in the gallbladder without wall thickening, pericholecystic fluid or Murphy sign, CT abdomen and pelvis showed no acute findings   Hypotension -Overnight patient had hypotension issues with tachycardia, obtain orthostatic vitals, cortisol level -Currently on IV fluids, n.p.o. for biopsy -May need to place on stress dose steroids if remains hypotensive    Rheumatoid arthritis (HCC) -Continue Plaquenil, sulfasalazine, prednisone   Code Status: Full CODE STATUS DVT Prophylaxis:   SCD's Family Communication: Discussed in detail with the patient, all imaging results, lab results explained to the patient, mother and aunt   Disposition Plan: Needs inpatient work-up for FUO, dyspnea  Time Spent in minutes 40 minutes  Procedures:  CT abdomen pelvis  Consultants:   ID General surgery  Antimicrobials:   Anti-infectives (  From admission, onward)   Start     Dose/Rate Route Frequency Ordered Stop   03/13/18 1130  ceFAZolin (ANCEF) IVPB 2g/100 mL premix     2 g 200 mL/hr over 30 Minutes Intravenous On call to O.R. 03/13/18 1125 03/14/18 0559   03/12/18 2000  hydroxychloroquine (PLAQUENIL) tablet 400 mg     400 mg Oral Daily 03/12/18 1829           Medications  Scheduled Meds: . hydroxychloroquine  400 mg Oral Daily  . predniSONE  5 mg Oral QAC breakfast  . sulfaSALAzine  500 mg Oral BID   Continuous Infusions: .  ceFAZolin (ANCEF) IV    . dextrose 5 % and 0.9% NaCl 100 mL/hr at 03/13/18 1134  . lactated ringers Stopped (03/13/18 1009)    PRN Meds:.acetaminophen **OR** acetaminophen, ketorolac      Subjective:   [name] was seen and examined today.  Feels face is swollen, shortness of breath afebrile this morning.  No chest pain.  Patient denies dizziness,  abdominal pain, N/V/D/C, new weakness, numbess, tingling.  Overnight events noted  Objective:   Vitals:   03/12/18 2346 03/13/18 0019 03/13/18 0252 03/13/18 0547  BP:  100/62 100/79 (!) 95/59  Pulse: (!) 117 (!) 103 87 85  Resp: '18 18 16   '$ Temp:  98 F (36.7 C) 97.6 F (36.4 C) 97.9 F (36.6 C)  TempSrc:  Oral Oral Oral  SpO2:  98% (!) 0% 99%  Weight:      Height:        Intake/Output Summary (Last 24 hours) at 03/13/2018 1214 Last data filed at 03/13/2018 0800 Gross per 24 hour  Intake 1942.67 ml  Output -  Net 1942.67 ml     Wt Readings from Last 3 Encounters:  03/12/18 80.3 kg  03/09/18 81.2 kg  09/25/17 83.5 kg     Exam  General: Alert and oriented x 3, NAD  Eyes:   HEENT:  Atraumatic, normocephalic  Cardiovascular: S1 S2 auscultated, tachycardia, regular rate and rhythm.  Respiratory: Decreased breath sound at the bases otherwise fairly clear, no wheezing  Gastrointestinal: Soft, nontender, nondistended, + bowel sounds  Ext: no pedal edema bilaterally  Neuro: No new deficits  Musculoskeletal: No digital cyanosis, clubbing  Skin: No rashes  Psych: Normal affect and demeanor, alert and oriented x3    Data Reviewed:  I have personally reviewed following labs and imaging studies  Micro Results Recent Results (from the past 240 hour(s))  Blood Culture (routine x 2)     Status: None (Preliminary result)   Collection Time: 03/09/18  8:42 AM  Result Value Ref Range Status   Specimen Description BLOOD RIGHT ANTECUBITAL  Final   Special Requests   Final    BOTTLES DRAWN AEROBIC AND ANAEROBIC Blood Culture adequate volume   Culture   Final    NO GROWTH 4 DAYS Performed at [facility], [address]  [place], [place] [address]    Report Status PENDING  Incomplete  Blood Culture (routine x 2)     Status: None (Preliminary result)   Collection Time: 03/09/18  8:55 AM  Result Value Ref Range Status   Specimen Description BLOOD RIGHT HAND  Final   Special Requests   Final    BOTTLES DRAWN AEROBIC AND ANAEROBIC Blood Culture adequate volume   Culture   Final    NO GROWTH 4 DAYS Performed at [facility], [place] [address] [place], [place] [address]  Report Status PENDING  Incomplete  Group A Strep by PCR     Status: None   Collection Time: 03/09/18 12:10 PM  Result Value Ref Range Status   Group A Strep by PCR NOT DETECTED NOT DETECTED Final    Comment: Performed at [facility], [address] [place], [place] [address]  Culture, blood (routine x 2)     Status: None (Preliminary result)   Collection Time: 03/12/18  9:06 AM  Result Value Ref Range Status   Specimen Description   Final    BLOOD LEFT ANTECUBITAL Performed at [facility] [address], [place], [place] [address]    Special Requests   Final    BOTTLES DRAWN AEROBIC AND ANAEROBIC Blood Culture results may not be optimal due to an excessive volume of blood received in culture bottles Performed at [facility] [address], [place], [place] [address]    Culture   Final    NO GROWTH < 24 HOURS Performed at [facility] [address], [place], [place] [address]    Report Status PENDING  Incomplete  Culture, blood (routine x 2)     Status: None (Preliminary result)   Collection Time: 03/12/18  9:45 AM  Result Value Ref Range Status   Specimen Description   Final    BLOOD RIGHT ANTECUBITAL Performed at [facility] [address], [place], [place] [address]    Special Requests   Final    BOTTLES DRAWN AEROBIC AND ANAEROBIC Blood Culture adequate volume Performed at [facility] [address], [place], [place]  [address]    Culture   Final    NO GROWTH < 24 HOURS Performed at [facility] [address], [place], [place] [address]    Report Status PENDING  Incomplete    Radiology Reports Dg Chest 2 View  Result Date: 03/12/2018 CLINICAL DATA:  Dyspnea and fever EXAM: CHEST - 2 VIEW COMPARISON:  Three days ago FINDINGS: Mildly low lung volumes with interstitial crowding. There is no edema, consolidation, effusion, or pneumothorax. Normal heart size and mediastinal contours. IMPRESSION: Low volume chest with mild atelectasis. Electronically Signed   By: Monte Fantasia M.D.   On: 03/12/2018 10:37   Dg Chest 2 View  Result Date: 03/09/2018 CLINICAL DATA:  Fever EXAM: CHEST - 2 VIEW COMPARISON:  None. FINDINGS: Lungs are clear. Heart size and pulmonary vascularity are normal. No adenopathy. No bone lesions. IMPRESSION: No edema or consolidation. Electronically Signed   By: Lowella Grip III M.D.   On: 03/09/2018 14:02   Ct Angio Chest Pe W And/or Wo Contrast  Result Date: 03/12/2018 CLINICAL DATA:  PE suspected EXAM: CT ANGIOGRAPHY CHEST WITH CONTRAST TECHNIQUE: Multidetector CT imaging of the chest was performed using the standard protocol during bolus administration of intravenous contrast. Multiplanar CT image reconstructions and MIPs were obtained to evaluate the vascular anatomy. CONTRAST:  163m ISOVUE-370 IOPAMIDOL (ISOVUE-370) INJECTION 76% COMPARISON:  Same day chest radiograph FINDINGS: Cardiovascular: Satisfactory opacification of the pulmonary arteries to the segmental level. No evidence of pulmonary embolism. Normal heart size. No pericardial effusion. Mediastinum/Nodes: Non-specific enlarged bilateral axillary lymph nodes, largest nodes in the right axilla measuring 2.8 cm. Thyroid gland, trachea, and esophagus demonstrate no significant findings. Lungs/Pleura: Bibasilar scarring or atelectasis. No pleural effusion or pneumothorax. Upper Abdomen: No acute abnormality. Musculoskeletal:  No chest wall abnormality. No acute or significant osseous findings. Review of the MIP  images confirms the above findings. IMPRESSION: 1.  Negative examination for pulmonary embolism. 2. Bibasilar scarring or atelectasis. 3. Non-specific enlarged bilateral axillary lymph nodes, largest nodes in the right axilla measuring 2.8 cm. Electronically Signed   By: Eddie Candle M.D.   On: 03/12/2018 15:55   Ct Abdomen Pelvis W Contrast  Result Date: 03/12/2018 CLINICAL DATA:  Fever of unknown origin EXAM: CT ABDOMEN AND PELVIS WITH CONTRAST TECHNIQUE: Multidetector CT imaging of the abdomen and pelvis was performed using the standard protocol following bolus administration of intravenous contrast. CONTRAST:  141m ISOVUE-370 IOPAMIDOL (ISOVUE-370) INJECTION 76% COMPARISON:  Right upper quadrant ultrasound, 03/09/2018. FINDINGS: Lower chest: Please see separately reported CT examination of the chest. Hepatobiliary: No focal liver abnormality is seen. Minimal sludge in the dependent gallbladder. No gallbladder wall thickening, or biliary dilatation. Pancreas: Unremarkable. No pancreatic ductal dilatation or surrounding inflammatory changes. Spleen: Normal in size without focal abnormality. Adrenals/Urinary Tract: Adrenal glands are unremarkable. Kidneys are normal, without renal calculi, focal lesion, or hydronephrosis. Bladder is unremarkable. Stomach/Bowel: Stomach is within normal limits. Appendix appears normal. No evidence of bowel wall thickening, distention, or inflammatory changes. Vascular/Lymphatic: No significant vascular findings are present. No enlarged abdominal or pelvic lymph nodes. Reproductive: No mass or other abnormality. Other: No abdominal wall hernia or abnormality. No abdominopelvic ascites. Musculoskeletal: No acute or significant osseous findings. IMPRESSION: 1. Minimal sludge in the dependent gallbladder, as seen on prior ultrasound examination. No acute findings. 2. No acute CT findings in the  abdomen or pelvis to explain fever. Normal appendix. Electronically Signed   By: AEddie CandleM.D.   On: 03/12/2018 16:00   UKoreaAbdomen Limited Ruq  Result Date: 03/09/2018 CLINICAL DATA:  Elevated LFTs EXAM: ULTRASOUND ABDOMEN LIMITED RIGHT UPPER QUADRANT COMPARISON:  None. FINDINGS: Gallbladder: There is some sludge in the gallbladder with no stones, wall thickening, or pericholecystic fluid. No Murphy's sign. Common bile duct: Diameter: 3.2 mm Liver: No focal lesion identified. Within normal limits in parenchymal echogenicity. Portal vein is patent on color Doppler imaging with normal direction of blood flow towards the liver. IMPRESSION: Mild sludge in the gallbladder without wall thickening, pericholecystic fluid, or Murphy's sign. No other abnormalities. Electronically Signed   By: DDorise BullionIII M.D   On: 03/09/2018 10:51    Lab Data:  CBC: Recent Labs  Lab 03/09/18 0843 03/12/18 0844 03/13/18 0622  WBC 8.6 10.9* 15.9*  NEUTROABS 4.2 4.6 PENDING  HGB 11.2* 11.4* 11.2*  HCT 36.5 36.9 37.5  MCV 82.0 85.0 87.2  PLT 239 245 2712  Basic Metabolic Panel: Recent Labs  Lab 03/09/18 0843 03/12/18 0844 03/13/18 0622  NA 133* 136 139  K 3.3* 2.9* 4.7  CL 99 103 107  CO2 19* 23 22  GLUCOSE 96 90 113*  BUN <5* <5* <5*  CREATININE 0.84 0.60 0.47  CALCIUM 8.4* 8.5* 8.6*  MG  --  2.3  --    GFR: Estimated Creatinine Clearance: 106 mL/min (by C-G formula based on SCr of 0.47 mg/dL). Liver Function Tests: Recent Labs  Lab 03/09/18 0843 03/12/18 0844 03/13/18 0622  AST 80* 157* 192*  ALT 116* 226* 285*  ALKPHOS 89 150* 151*  BILITOT 1.0 1.0 0.8  PROT 7.0 6.8 5.8*  ALBUMIN 3.4* 3.4* 2.9*   No results for input(s): LIPASE, AMYLASE in the last 168 hours. No results for input(s): AMMONIA in the last 168 hours. Coagulation Profile: No results for input(s): INR, PROTIME in the last 168 hours. Cardiac Enzymes:  No results for input(s): CKTOTAL, CKMB, CKMBINDEX, TROPONINI in  the last 168 hours. BNP (last 3 results) No results for input(s): PROBNP in the last 8760 hours. HbA1C: No results for input(s): HGBA1C in the last 72 hours. CBG: Recent Labs  Lab 03/12/18 0757  GLUCAP 86   Lipid Profile: No results for input(s): CHOL, HDL, LDLCALC, TRIG, CHOLHDL, LDLDIRECT in the last 72 hours. Thyroid Function Tests: Recent Labs    03/12/18 0844  TSH 1.964   Anemia Panel: No results for input(s): VITAMINB12, FOLATE, FERRITIN, TIBC, IRON, RETICCTPCT in the last 72 hours. Urine analysis:    Component Value Date/Time   COLORURINE YELLOW 03/12/2018 1142   APPEARANCEUR HAZY (A) 03/12/2018 1142   LABSPEC 1.006 03/12/2018 1142   PHURINE 6.0 03/12/2018 1142   GLUCOSEU NEGATIVE 03/12/2018 1142   HGBUR NEGATIVE 03/12/2018 1142   BILIRUBINUR NEGATIVE 03/12/2018 1142   KETONESUR 20 (A) 03/12/2018 1142   PROTEINUR NEGATIVE 03/12/2018 1142   NITRITE NEGATIVE 03/12/2018 1142   LEUKOCYTESUR TRACE (A) 03/12/2018 1142     Aarsh Fristoe M.D. [facility] 03/13/2018, 12:14 PM  Pager: [phone] Between 7am to 7pm - call Pager - [phone]  After 7pm go to [email or web address] - password [email or web address]  Call night coverage person covering after 7pm

## 2018-03-13 NOTE — Op Note (Signed)
Erin Clay  Oct 17, 1980 13 March 2018   PCP:  Patient, No Pcp Per   Surgeon: Kaylyn Lim, MD, FACS  Asst:  Brigid Re, PA  Anes:  general  Preop Dx: Fever, night sweats, axillary and cervical adenopathy Postop Dx: Same; path pending for AFB, fungus, and lymphoma prep  Procedure: Right axillary lymph node biopsy Location Surgery: [facility] Complications: None noted  EBL:   4 cc  Drains:  none  Description of Procedure:  The patient was taken to OR 1 .  After anesthesia was administered and the patient was prepped  with Chloroprep and a timeout was performed.  The right axilla was palpated and multiple nodes were noted.  A transverse incision was made at the hair line and carried into the axilla.  The fascia was divided and plump, maroon lymph node cluster could be seen.  Two of these nodes were dissected free from the surrounding tissue and small clips were used to control the lymphovascular connections.  The nodes were sent fresh to path for lymphoma prep and for AFB and fungus.  Hemostasis was present.  The fascia was approximated with 4-0 vicryl and the skin was closed with 4-0 monocryl and Dermabond.    The patient tolerated the procedure well and was taken to the PACU in stable condition.     Matt B. Hassell Done, [place], [facility], [place]

## 2018-03-13 NOTE — Consult Note (Addendum)
Reason for Consult: Requesting axillary lymph node biopsy Referring Physician: Dr. Tyler Pita  Erin Figuero is an 38 y.o. female.   HPI: Patient is a 38 year old female who was seen by PCP on 03/09/2018 complaining of fever and lymphadenopathy reported the face is swollen tender lymph nodes in her neck.  She is having episodes of feeling hot and cold.  On evaluation in the ED her temperature was 103.  She reports she was last in [place] 4 months ago.  She was discharged with a diagnosis of fever, unspecified fever cause influenza-like illness, she was treated with Augmentin at discharge.  Yesterday she returned to the ED planing of swollen lymph nodes, shortness of breath, facial swelling, dizziness, itching and a dry mouth for 2 weeks.  Patient was crying at and concerned.  She was seen by her PCP on 2/10 where they told her everything was okay.  She was very frightened about what was occurring On evaluation yesterday she was noted to be on low-dose prednisone, Plaquenil and sulfasalazine for rheumatoid arthritis.  She also complained of itching.  She was in [place] for about a month and returned back in October 2019, to the [place].  Work-up so far shows 1 low-grade temperature 100.4.  Some tachycardia that is improving.  She was put on nasal cannula last evening.  Admission labs yesterday shows an elevated glucose of 113, calcium 8.6, alk phos of 151 AST 192, ALT 285.  Total bilirubin is 0.8.  WBC is 15.9, hemoglobin 11.2, hematocrit 37.5, platelets 269,000.  Urinalysis is unremarkable.  Lung volumes with mild atelectasis.  CT of the abdomen with contrast was essentially unremarkable. Minimal sludge in the gallbladder seen on the prior ultrasound exam but no other acute findings.  This was followed by a CT angios with chest PE protocol.  This was negative for pulmonary embolism.  Mediastinal nodes were nonspecifically enlarged bilateral axillary lymph nodes largest in the right axilla measuring 2.8 cm.  Thyroid  trachea and esophagus were normal lungs were normal.  The only finding was the nonspecific enlarged bilateral axillary lymph nodes.  We are asked to see for possible biopsy of the axillary lymph node. History reviewed. No pertinent past medical history.  History reviewed. No pertinent surgical history.  No family history on file.  Social History:  reports that she has never smoked. She has never used smokeless tobacco. She reports that she does not drink alcohol or use drugs.  Allergies: No Known Allergies  In addition to the prescribed medication she is on several over-the-counter/family remedies for her arthritis, that are not listed. . Prior to Admission medications   Medication Sig Start Date End Date Taking? Authorizing Provider  acetaminophen (TYLENOL) 500 MG tablet Take 1,000 mg by mouth every 6 (six) hours as needed for mild pain or headache.   Yes [provider]  amoxicillin-clavulanate (AUGMENTIN) 875-125 MG tablet Take 1 tablet by mouth every 12 (twelve) hours. 03/09/18  Yes Hayden Rasmussen, MD  cetirizine (ZYRTEC) 10 MG tablet Take 10 mg by mouth daily as needed for allergies (itching).    Yes [provider]  cholecalciferol (VITAMIN D3) 25 MCG (1000 UT) tablet Take 1,000 Units by mouth daily.   Yes [provider]  Ferrous Sulfate (IRON PO) Take 1 tablet by mouth daily.   Yes [provider]  glucosamine-chondroitin 500-400 MG tablet Take 2 tablets by mouth daily.    Yes [provider]  hydroxychloroquine (PLAQUENIL) 200 MG tablet Take 400 mg by  mouth daily. 02/17/18  Yes [provider]  ibuprofen (ADVIL,MOTRIN) 200 MG tablet Take 400 mg by mouth every 6 (six) hours as needed for headache or moderate pain.   Yes [provider]  meclizine (ANTIVERT) 32 MG tablet Take 1 tablet (32 mg total) by mouth 3 (three) times daily as needed. Patient taking differently: Take 32 mg by mouth 3 (three) times daily as needed  for dizziness.  04/13/16  Yes Volanda Napoleon, PA-C  Multiple Vitamins-Minerals (MULTIVITAMIN WITH MINERALS) tablet Take 1 tablet by mouth daily.   Yes [provider]  predniSONE (DELTASONE) 5 MG tablet Take 5 mg by mouth daily.  02/04/18  Yes [provider]  sulfaSALAzine (AZULFIDINE) 500 MG EC tablet Take 500 mg by mouth 2 (two) times daily. 02/25/18  Yes [provider]     Results for orders placed or performed during the hospital encounter of 03/12/18 (from the past 48 hour(s))  CBG monitoring, ED     Status: None   Collection Time: 03/12/18  7:57 AM  Result Value Ref Range   Glucose-Capillary 86 70 - 99 mg/dL  CBC with Differential     Status: Abnormal   Collection Time: 03/12/18  8:44 AM  Result Value Ref Range   WBC 10.9 (H) 4.0 - 10.5 K/uL   RBC 4.34 3.87 - 5.11 MIL/uL   Hemoglobin 11.4 (L) 12.0 - 15.0 g/dL   HCT 36.9 36.0 - 46.0 %   MCV 85.0 80.0 - 100.0 fL   MCH 26.3 26.0 - 34.0 pg   MCHC 30.9 30.0 - 36.0 g/dL   RDW 15.6 (H) 11.5 - 15.5 %   Platelets 245 150 - 400 K/uL   nRBC 0.2 0.0 - 0.2 %   Neutrophils Relative % 42 %   Neutro Abs 4.6 1.7 - 7.7 K/uL   Lymphocytes Relative 40 %   Lymphs Abs 4.4 (H) 0.7 - 4.0 K/uL   Monocytes Relative 11 %   Monocytes Absolute 1.2 (H) 0.1 - 1.0 K/uL   Eosinophils Relative 4 %   Eosinophils Absolute 0.4 0.0 - 0.5 K/uL   Basophils Relative 1 %   Basophils Absolute 0.1 0.0 - 0.1 K/uL   Immature Granulocytes 2 %   Abs Immature Granulocytes 0.17 (H) 0.00 - 0.07 K/uL   Polychromasia PRESENT     Comment: Performed at [facility], [place] [address] [place], [place] [address]  Comprehensive metabolic panel     Status: Abnormal   Collection Time: 03/12/18  8:44 AM  Result Value Ref Range   Sodium 136 135 - 145 mmol/L   Potassium 2.9 (L) 3.5 - 5.1 mmol/L   Chloride 103 98 - 111 mmol/L   CO2 23 22 - 32 mmol/L   Glucose, Bld 90 70 - 99 mg/dL   BUN <5 (L) 6 - 20 mg/dL   Creatinine, Ser 0.60  0.44 - 1.00 mg/dL   Calcium 8.5 (L) 8.9 - 10.3 mg/dL   Total Protein 6.8 6.5 - 8.1 g/dL   Albumin 3.4 (L) 3.5 - 5.0 g/dL   AST 157 (H) 15 - 41 U/L   ALT 226 (H) 0 - 44 U/L   Alkaline Phosphatase 150 (H) 38 - 126 U/L   Total Bilirubin 1.0 0.3 - 1.2 mg/dL   GFR calc non Af Amer >60 >60 mL/min   GFR calc Af Amer >60 >60 mL/min   Anion gap 10 5 - 15    Comment: Performed at [facility], [place] [address], [place], [place] [address]  HIV antibody     Status: None   Collection Time: 03/12/18  8:44 AM  Result Value Ref Range   HIV Screen 4th Generation wRfx Non Reactive Non Reactive    Comment: (NOTE) Performed At: [facility] [place], [place] [id number] Rush Farmer MD BM:[id number]   TSH     Status: None   Collection Time: 03/12/18  8:44 AM  Result Value Ref Range   TSH 1.964 0.350 - 4.500 uIU/mL    Comment: Performed by a 3rd Generation assay with a functional sensitivity of <=0.01 uIU/mL. Performed at [facility], [place] [address], [place], [place] [address]   Magnesium     Status: None   Collection Time: 03/12/18  8:44 AM  Result Value Ref Range   Magnesium 2.3 1.7 - 2.4 mg/dL    Comment: Performed at [facility], [place] [address], [place], [place] [address]  Culture, blood (routine x 2)     Status: None (Preliminary result)   Collection Time: 03/12/18  9:06 AM  Result Value Ref Range   Specimen Description      BLOOD LEFT ANTECUBITAL Performed at [place] [address] [place], [place] [address]    Special Requests      BOTTLES DRAWN AEROBIC AND ANAEROBIC Blood Culture results may not be optimal due to an excessive volume of blood received in culture bottles Performed at [place] [address], [place], [place] [address]    Culture      NO GROWTH < 24 HOURS Performed at [place] [address] [place], [place] [address]    Report  Status PENDING   I-Stat beta hCG blood, ED     Status: None   Collection Time: 03/12/18  9:11 AM  Result Value Ref Range   I-stat hCG, quantitative <5.0 <5 mIU/mL   Comment 3            Comment:   GEST. AGE      CONC.  (mIU/mL)   <=1 WEEK        5 - 50     2 WEEKS       50 - 500     3 WEEKS       100 - 10,000     4 WEEKS     1,000 - 30,000        FEMALE AND NON-PREGNANT FEMALE:     LESS THAN 5 mIU/mL   Culture, blood (routine x 2)     Status: None (Preliminary result)   Collection Time: 03/12/18  9:45 AM  Result Value Ref Range   Specimen Description      BLOOD RIGHT ANTECUBITAL Performed at [facility], [place] [address] [place], [place] [address]    Special Requests      BOTTLES DRAWN AEROBIC AND ANAEROBIC Blood Culture adequate volume Performed at [place] [address], [place], [place] [address]    Culture      NO GROWTH < 24 HOURS Performed at [place] [address] [place], [place] [address]    Report Status PENDING   Urinalysis, Routine w reflex microscopic     Status: Abnormal   Collection Time: 03/12/18 11:42 AM  Result Value Ref Range   Color, Urine YELLOW YELLOW   APPearance HAZY (A) CLEAR   Specific Gravity, Urine 1.006 1.005 -  1.030   pH 6.0 5.0 - 8.0   Glucose, UA NEGATIVE NEGATIVE mg/dL   Hgb urine dipstick NEGATIVE NEGATIVE   Bilirubin Urine NEGATIVE NEGATIVE   Ketones, ur 20 (A) NEGATIVE mg/dL   Protein, ur NEGATIVE NEGATIVE mg/dL   Nitrite NEGATIVE NEGATIVE   Leukocytes,Ua TRACE (A) NEGATIVE   RBC / HPF 0-5 0 - 5 RBC/hpf   WBC, UA 0-5 0 - 5 WBC/hpf   Bacteria, UA FEW (A) NONE SEEN   Squamous Epithelial / LPF 6-10 0 - 5    Comment: Performed at [facility], [place] [address], [place], [place] [address]  Lactic acid, plasma     Status: Abnormal   Collection Time: 03/12/18  2:23 PM  Result Value Ref Range   Lactic Acid, Venous 2.0 (HH) 0.5 - 1.9 mmol/L    Comment: CRITICAL  RESULT CALLED TO, READ BACK BY AND VERIFIED WITH: M.BRILL AT 1523 ON 03/12/18 BY N.THOMPSON Performed at [facility], [place] [address] [place], [place] [address]   Lactic acid, plasma     Status: Abnormal   Collection Time: 03/12/18  7:13 PM  Result Value Ref Range   Lactic Acid, Venous 2.1 (HH) 0.5 - 1.9 mmol/L    Comment: CRITICAL RESULT CALLED TO, READ BACK BY AND VERIFIED WITH: T.RYAN AT 1950 ON 03/12/18 BY N.THOMPSON Performed at [facility], [place] [address], [place], [place] [address]   C-reactive protein     Status: Abnormal   Collection Time: 03/12/18  7:13 PM  Result Value Ref Range   CRP 7.6 (H) <1.0 mg/dL    Comment: Performed at [facility], [place] [address], [place], [place] [address]  Hepatitis panel, acute     Status: None   Collection Time: 03/12/18  7:13 PM  Result Value Ref Range   Hepatitis B Surface Ag Negative Negative   HCV Ab <0.1 0.0 - 0.9 s/co ratio    Comment: (NOTE)                                  Negative:     < 0.8                             Indeterminate: 0.8 - 0.9                                  Positive:     > 0.9 The CDC recommends that a positive HCV antibody result be followed up with a HCV Nucleic Acid Amplification test (450388). Performed At: [facility] [place], [place] [id number] Rush Farmer MD PH:[id number]    Hep A IgM Negative Negative   Hep B C IgM Negative Negative  CMV IgM     Status: None   Collection Time: 03/12/18  7:13 PM  Result Value Ref Range   CMV IgM <30.0 0.0 - 29.9 AU/mL    Comment: (NOTE)                                Negative         <30.0  Equivocal  30.0 - 34.9                                Positive         >34.9 A positive result is generally indicative of acute infection, reactivation or persistent IgM production. Performed At: [facility] [place], [place]  [id number] Rush Farmer MD KN:[id number]   Cmv antibody, IgG (EIA)     Status: Abnormal   Collection Time: 03/12/18  7:13 PM  Result Value Ref Range   CMV Ab - IgG >10.00 (H) 0.00 - 0.59 U/mL    Comment: (NOTE)                               Negative          <0.60                               Equivocal   0.60 - 0.69                               Positive          >0.69 Performed At: [facility] [place], [place] [id number] Rush Farmer MD ZH:[id number]   Lactate dehydrogenase     Status: Abnormal   Collection Time: 03/12/18  8:46 PM  Result Value Ref Range   LDH 702 (H) 98 - 192 U/L    Comment: Performed at [facility], [place] [address], [place], [place] [address]  Sedimentation rate     Status: None   Collection Time: 03/12/18  8:46 PM  Result Value Ref Range   Sed Rate 12 0 - 22 mm/hr    Comment: Performed at [facility], [place] [address] [place], [place] [address]  CBC with Differential/Platelet     Status: Abnormal (Preliminary result)   Collection Time: 03/13/18  6:22 AM  Result Value Ref Range   WBC 15.9 (H) 4.0 - 10.5 K/uL   RBC 4.30 3.87 - 5.11 MIL/uL   Hemoglobin 11.2 (L) 12.0 - 15.0 g/dL   HCT 37.5 36.0 - 46.0 %   MCV 87.2 80.0 - 100.0 fL   MCH 26.0 26.0 - 34.0 pg   MCHC 29.9 (L) 30.0 - 36.0 g/dL   RDW 15.9 (H) 11.5 - 15.5 %   Platelets 269 150 - 400 K/uL   nRBC 0.1 0.0 - 0.2 %    Comment: Performed at [facility], [place] [address] [place], [place] [address]   Neutrophils Relative % PENDING %   Neutro Abs PENDING 1.7 - 7.7 K/uL   Band Neutrophils PENDING %   Lymphocytes Relative PENDING %   Lymphs Abs PENDING 0.7 - 4.0 K/uL   Monocytes Relative PENDING %   Monocytes Absolute PENDING 0.1 - 1.0 K/uL   Eosinophils Relative PENDING %   Eosinophils Absolute PENDING 0.0 - 0.5 K/uL   Basophils Relative PENDING %   Basophils Absolute PENDING 0.0 - 0.1 K/uL   WBC Morphology PENDING     RBC Morphology PENDING    Smear Review PENDING    Other PENDING %   nRBC PENDING 0 /100 WBC   Metamyelocytes Relative PENDING %   Myelocytes PENDING %  Promyelocytes Relative PENDING %   Blasts PENDING %  Comprehensive metabolic panel     Status: Abnormal   Collection Time: 03/13/18  6:22 AM  Result Value Ref Range   Sodium 139 135 - 145 mmol/L   Potassium 4.7 3.5 - 5.1 mmol/L    Comment: DELTA CHECK NOTED REPEATED TO VERIFY SLIGHT HEMOLYSIS    Chloride 107 98 - 111 mmol/L   CO2 22 22 - 32 mmol/L   Glucose, Bld 113 (H) 70 - 99 mg/dL   BUN <5 (L) 6 - 20 mg/dL   Creatinine, Ser 0.47 0.44 - 1.00 mg/dL   Calcium 8.6 (L) 8.9 - 10.3 mg/dL   Total Protein 5.8 (L) 6.5 - 8.1 g/dL   Albumin 2.9 (L) 3.5 - 5.0 g/dL   AST 192 (H) 15 - 41 U/L   ALT 285 (H) 0 - 44 U/L   Alkaline Phosphatase 151 (H) 38 - 126 U/L   Total Bilirubin 0.8 0.3 - 1.2 mg/dL   GFR calc non Af Amer >60 >60 mL/min   GFR calc Af Amer >60 >60 mL/min   Anion gap 10 5 - 15    Comment: Performed at [facility], [place] [address] [place], [place] [address]    Dg Chest 2 View  Result Date: 03/12/2018 CLINICAL DATA:  Dyspnea and fever EXAM: CHEST - 2 VIEW COMPARISON:  Three days ago FINDINGS: Mildly low lung volumes with interstitial crowding. There is no edema, consolidation, effusion, or pneumothorax. Normal heart size and mediastinal contours. IMPRESSION: Low volume chest with mild atelectasis. Electronically Signed   By: Monte Fantasia M.D.   On: 03/12/2018 10:37   Ct Angio Chest Pe W And/or Wo Contrast  Result Date: 03/12/2018 CLINICAL DATA:  PE suspected EXAM: CT ANGIOGRAPHY CHEST WITH CONTRAST TECHNIQUE: Multidetector CT imaging of the chest was performed using the standard protocol during bolus administration of intravenous contrast. Multiplanar CT image reconstructions and MIPs were obtained to evaluate the vascular anatomy. CONTRAST:  15m ISOVUE-370 IOPAMIDOL (ISOVUE-370) INJECTION 76%  COMPARISON:  Same day chest radiograph FINDINGS: Cardiovascular: Satisfactory opacification of the pulmonary arteries to the segmental level. No evidence of pulmonary embolism. Normal heart size. No pericardial effusion. Mediastinum/Nodes: Non-specific enlarged bilateral axillary lymph nodes, largest nodes in the right axilla measuring 2.8 cm. Thyroid gland, trachea, and esophagus demonstrate no significant findings. Lungs/Pleura: Bibasilar scarring or atelectasis. No pleural effusion or pneumothorax. Upper Abdomen: No acute abnormality. Musculoskeletal: No chest wall abnormality. No acute or significant osseous findings. Review of the MIP images confirms the above findings. IMPRESSION: 1.  Negative examination for pulmonary embolism. 2. Bibasilar scarring or atelectasis. 3. Non-specific enlarged bilateral axillary lymph nodes, largest nodes in the right axilla measuring 2.8 cm. Electronically Signed   By: AEddie CandleM.D.   On: 03/12/2018 15:55   Ct Abdomen Pelvis W Contrast  Result Date: 03/12/2018 CLINICAL DATA:  Fever of unknown origin EXAM: CT ABDOMEN AND PELVIS WITH CONTRAST TECHNIQUE: Multidetector CT imaging of the abdomen and pelvis was performed using the standard protocol following bolus administration of intravenous contrast. CONTRAST:  1031mISOVUE-370 IOPAMIDOL (ISOVUE-370) INJECTION 76% COMPARISON:  Right upper quadrant ultrasound, 03/09/2018. FINDINGS: Lower chest: Please see separately reported CT examination of the chest. Hepatobiliary: No focal liver abnormality is seen. Minimal sludge in the dependent gallbladder. No gallbladder wall thickening, or biliary dilatation. Pancreas: Unremarkable. No pancreatic ductal dilatation or surrounding inflammatory changes. Spleen: Normal in size without focal abnormality. Adrenals/Urinary Tract: Adrenal glands are unremarkable. Kidneys are  normal, without renal calculi, focal lesion, or hydronephrosis. Bladder is unremarkable. Stomach/Bowel: Stomach is  within normal limits. Appendix appears normal. No evidence of bowel wall thickening, distention, or inflammatory changes. Vascular/Lymphatic: No significant vascular findings are present. No enlarged abdominal or pelvic lymph nodes. Reproductive: No mass or other abnormality. Other: No abdominal wall hernia or abnormality. No abdominopelvic ascites. Musculoskeletal: No acute or significant osseous findings. IMPRESSION: 1. Minimal sludge in the dependent gallbladder, as seen on prior ultrasound examination. No acute findings. 2. No acute CT findings in the abdomen or pelvis to explain fever. Normal appendix. Electronically Signed   By: Eddie Candle M.D.   On: 03/12/2018 16:00    Review of Systems  Constitutional: Positive for chills and fever.  HENT: Negative.        Patient complains of facial swelling, bilateral mandibular lymphadenopathy, left supraclavicular lymphadenopathy, and axillary lynphadenopathy  Eyes: Negative.   Respiratory: Positive for shortness of breath. Negative for cough, hemoptysis, sputum production and wheezing.   Cardiovascular: Positive for palpitations. Negative for chest pain, orthopnea, claudication, leg swelling and PND.  Gastrointestinal: Negative.   Genitourinary: Negative.   Musculoskeletal: Positive for joint pain. Negative for back pain, falls, myalgias and neck pain.       She has a history of rheumatoid arthritis that affected her right ankle her fingers her knees and shoulders.  Has been on Plaquenil for 2 months and those symptoms have essentially resolved.  She is followed by Dr. Jenetta Downer  Skin: Negative.   Neurological: Negative.   Endo/Heme/Allergies: Negative.   Psychiatric/Behavioral: The patient is nervous/anxious (She is very anxious and nervous.  Very fearful of what this might be.).    Blood pressure (!) 95/59, pulse 85, temperature 97.9 F (36.6 C), temperature source Oral, resp. rate 16, height '5\' 7"'$  (1.702 m), weight 80.3 kg, last menstrual period  02/26/2018, SpO2 99 %. Physical Exam  Constitutional: She is oriented to person, place, and time. She appears well-developed and well-nourished. No distress.  Aside from her anxiety level she is in no acute distress.  HENT:  Head: Normocephalic and atraumatic.  Mouth/Throat: Oropharynx is clear and moist. No oropharyngeal exudate.  She has bilateral mandibular lymphadenopathy that is palpable.  She also complains of facial swelling that became apparent with the onset of lymphadenopathy.  Eyes: Right eye exhibits no discharge. Left eye exhibits no discharge. No scleral icterus.  Pupils are equal  Neck: Normal range of motion. Neck supple. No JVD present. No tracheal deviation present. No thyromegaly present.  Cardiovascular: Regular rhythm, normal heart sounds and intact distal pulses.  No murmur heard. She is somewhat tachycardic.  Respiratory: Effort normal and breath sounds normal. No respiratory distress. She has no wheezes. She has no rales. She exhibits no tenderness.  She has a left supraclavicular lymph node that is palpable.  She also has lymph nodes on the right and left axilla.  The one on the right axilla was mentioned on the CT scan but the one on the left is also very palpable.  All of these nodes feel like they are about a centimeter or larger.  GI: Soft. Bowel sounds are normal. She exhibits no distension. There is no abdominal tenderness. There is no rebound and no guarding.  Musculoskeletal:        General: No tenderness or edema.  Lymphadenopathy:    She has no cervical adenopathy.  Neurological: She is alert and oriented to person, place, and time. No cranial nerve deficit.  Skin: Skin is  warm and dry. No rash noted. She is not diaphoretic. No erythema. No pallor.  Psychiatric: She has a normal mood and affect. Her behavior is normal. Judgment and thought content normal.    Assessment/Plan: Fever Facial swelling with new lymphadenopathy Mild leukocytosis/LFT  elevation. Rheumatoid arthritis -on Plaquenil, sulfasalazine, prednisone Recent travel to [place]  Plan: Tentatively posted her for lymph node biopsy by Dr. Hassell Done later today.  I have posted her for the right side but the left side actually feels a little larger to me, and I will let Dr. Hassell Done make a final decision on which side biopsy.  Elycia Woodside 03/13/2018, 10:53 AM

## 2018-03-13 NOTE — Anesthesia Postprocedure Evaluation (Signed)
Anesthesia Post Note  Patient: Erin Clay  Procedure(s) Performed: AXILLARY LYMPH NODE BIOPSY (Right Axilla)     Patient location during evaluation: PACU Anesthesia Type: General Level of consciousness: sedated Pain management: pain level controlled Vital Signs Assessment: post-procedure vital signs reviewed and stable Respiratory status: spontaneous breathing Cardiovascular status: stable Postop Assessment: no apparent nausea or vomiting Anesthetic complications: no    Last Vitals:  Vitals:   03/13/18 1331 03/13/18 1500  BP:    Pulse: (!) 108   Resp:    Temp:  37.6 C  SpO2:      Last Pain:  Vitals:   03/13/18 1500  TempSrc: Oral  PainSc:    Pain Goal:                   Huston Foley

## 2018-03-13 NOTE — Anesthesia Procedure Notes (Signed)
Procedure Name: Intubation Date/Time: 03/13/2018 3:50 PM Performed by: Montel Clock, CRNA Pre-anesthesia Checklist: Patient identified, Emergency Drugs available, Suction available, Patient being monitored and Timeout performed Patient Re-evaluated:Patient Re-evaluated prior to induction Oxygen Delivery Method: Circle system utilized Preoxygenation: Pre-oxygenation with 100% oxygen Induction Type: IV induction and Rapid sequence Laryngoscope Size: Mac and 3 Grade View: Grade II Tube type: Oral Tube size: 7.0 mm Number of attempts: 1 Airway Equipment and Method: Stylet Placement Confirmation: ETT inserted through vocal cords under direct vision,  positive ETCO2 and breath sounds checked- equal and bilateral Secured at: 21 cm Tube secured with: Tape Dental Injury: Teeth and Oropharynx as per pre-operative assessment

## 2018-03-13 NOTE — Progress Notes (Signed)
Pt tolerated clear and full liquids without any issue this evening. Pt requesting to have diet advanced back to normal diet.

## 2018-03-13 NOTE — Interval H&P Note (Signed)
History and Physical Interval Note:  03/13/2018 3:34 PM  Erin Clay  has presented today for surgery, with the diagnosis of lymphadenapathy, unknown etiology  The various methods of treatment have been discussed with the patient and family. After consideration of risks, benefits and other options for treatment, the patient has consented to  Procedure(s): AXILLARY LYMPH NODE BIOPSY (Right) as a surgical intervention .  The patient's history has been reviewed, patient examined, no change in status, stable for surgery.  I have reviewed the patient's chart and labs.  Questions were answered to the patient's satisfaction.     Pedro Earls

## 2018-03-13 NOTE — Progress Notes (Signed)
Dr. Ola Spurr aware of patient's NPO status. Surgery will be delayed until 1530. Erin Clay is verbalized understanding. No visitors at hospital at this time.

## 2018-03-13 NOTE — Transfer of Care (Signed)
Immediate Anesthesia Transfer of Care Note  Patient: Linzi Majano  Procedure(s) Performed: AXILLARY LYMPH NODE BIOPSY (Right Axilla)  Patient Location: PACU  Anesthesia Type:General  Level of Consciousness: drowsy and patient cooperative  Airway & Oxygen Therapy: Patient Spontanous Breathing and Patient connected to face mask oxygen  Post-op Assessment: Report given to RN and Post -op Vital signs reviewed and stable  Post vital signs: Reviewed and stable  Last Vitals:  Vitals Value Taken Time  BP    Temp    Pulse    Resp    SpO2      Last Pain:  Vitals:   03/13/18 1500  TempSrc: Oral  PainSc:          Complications: No apparent anesthesia complications

## 2018-03-14 ENCOUNTER — Inpatient Hospital Stay (HOSPITAL_COMMUNITY): Payer: BLUE CROSS/BLUE SHIELD

## 2018-03-14 DIAGNOSIS — R0602 Shortness of breath: Secondary | ICD-10-CM | POA: Diagnosis not present

## 2018-03-14 DIAGNOSIS — R509 Fever, unspecified: Secondary | ICD-10-CM | POA: Diagnosis not present

## 2018-03-14 DIAGNOSIS — R590 Localized enlarged lymph nodes: Secondary | ICD-10-CM | POA: Diagnosis not present

## 2018-03-14 DIAGNOSIS — R748 Abnormal levels of other serum enzymes: Secondary | ICD-10-CM | POA: Diagnosis not present

## 2018-03-14 DIAGNOSIS — E872 Acidosis: Secondary | ICD-10-CM | POA: Diagnosis not present

## 2018-03-14 LAB — COMPREHENSIVE METABOLIC PANEL WITH GFR
ALT: 259 U/L — ABNORMAL HIGH (ref 0–44)
AST: 179 U/L — ABNORMAL HIGH (ref 15–41)
Albumin: 3 g/dL — ABNORMAL LOW (ref 3.5–5.0)
Alkaline Phosphatase: 190 U/L — ABNORMAL HIGH (ref 38–126)
Anion gap: 10 (ref 5–15)
BUN: 5 mg/dL — ABNORMAL LOW (ref 6–20)
CO2: 22 mmol/L (ref 22–32)
Calcium: 8.4 mg/dL — ABNORMAL LOW (ref 8.9–10.3)
Chloride: 106 mmol/L (ref 98–111)
Creatinine, Ser: 0.74 mg/dL (ref 0.44–1.00)
GFR calc Af Amer: >60 mL/min
GFR calc non Af Amer: >60 mL/min
Glucose, Bld: 93 mg/dL (ref 70–99)
Potassium: 3.3 mmol/L — ABNORMAL LOW (ref 3.5–5.1)
Sodium: 138 mmol/L (ref 135–145)
Total Bilirubin: 0.7 mg/dL (ref 0.3–1.2)
Total Protein: 5.8 g/dL — ABNORMAL LOW (ref 6.5–8.1)

## 2018-03-14 LAB — CBC
HCT: 32.1 % — ABNORMAL LOW (ref 36.0–46.0)
Hemoglobin: 9.7 g/dL — ABNORMAL LOW (ref 12.0–15.0)
MCH: 25.5 pg — ABNORMAL LOW (ref 26.0–34.0)
MCHC: 30.2 g/dL (ref 30.0–36.0)
MCV: 84.5 fL (ref 80.0–100.0)
Platelets: 298 10*3/uL (ref 150–400)
RBC: 3.8 MIL/uL — ABNORMAL LOW (ref 3.87–5.11)
RDW: 16.7 % — ABNORMAL HIGH (ref 11.5–15.5)
WBC: 17.8 10*3/uL — ABNORMAL HIGH (ref 4.0–10.5)
nRBC: 1.2 % — ABNORMAL HIGH (ref 0.0–0.2)

## 2018-03-14 LAB — CULTURE, BLOOD (ROUTINE X 2)
CULTURE: NO GROWTH
Culture: NO GROWTH
Special Requests: ADEQUATE
Special Requests: ADEQUATE

## 2018-03-14 LAB — ECHOCARDIOGRAM COMPLETE
Height: 67 in
Weight: 3185.21 oz

## 2018-03-14 LAB — PROCALCITONIN: Procalcitonin: 0.93 ng/mL

## 2018-03-14 LAB — LACTIC ACID, PLASMA: Lactic Acid, Venous: 3.3 mmol/L (ref 0.5–1.9)

## 2018-03-14 MED ORDER — VANCOMYCIN HCL 10 G IV SOLR
2000.0000 mg | Freq: Once | INTRAVENOUS | Status: AC
  Administered 2018-03-14: 2000 mg via INTRAVENOUS
  Filled 2018-03-14: qty 2000

## 2018-03-14 MED ORDER — LORAZEPAM 1 MG PO TABS: 1.0000 mg | ORAL_TABLET | Freq: Three times a day (TID) | ORAL | Status: DC | PRN

## 2018-03-14 MED ORDER — PIPERACILLIN-TAZOBACTAM 3.375 G IVPB
3.3750 g | Freq: Three times a day (TID) | INTRAVENOUS | Status: DC
  Administered 2018-03-14 – 2018-03-15 (×2): 3.375 g via INTRAVENOUS
  Filled 2018-03-14 (×4): qty 50

## 2018-03-14 MED ORDER — SODIUM CHLORIDE 0.9 % IV SOLN
INTRAVENOUS | Status: DC
  Administered 2018-03-14 – 2018-03-15 (×3): via INTRAVENOUS

## 2018-03-14 MED ORDER — POTASSIUM CHLORIDE CRYS ER 20 MEQ PO TBCR
40.0000 meq | EXTENDED_RELEASE_TABLET | Freq: Once | ORAL | Status: AC
  Administered 2018-03-14: 40 meq via ORAL
  Filled 2018-03-14: qty 2

## 2018-03-14 MED ORDER — VANCOMYCIN HCL IN DEXTROSE 1-5 GM/200ML-% IV SOLN
1000.0000 mg | Freq: Two times a day (BID) | INTRAVENOUS | Status: DC
  Administered 2018-03-15: 1000 mg via INTRAVENOUS
  Filled 2018-03-14: qty 200

## 2018-03-14 MED ORDER — SODIUM CHLORIDE 0.9 % IV BOLUS
1000.0000 mL | Freq: Once | INTRAVENOUS | Status: AC
  Administered 2018-03-14: 1000 mL via INTRAVENOUS

## 2018-03-14 MED ORDER — LACTATED RINGERS IV BOLUS
1000.0000 mL | Freq: Once | INTRAVENOUS | Status: AC
  Administered 2018-03-14: 1000 mL via INTRAVENOUS

## 2018-03-14 MED ORDER — PIPERACILLIN-TAZOBACTAM 3.375 G IVPB 30 MIN
3.3750 g | INTRAVENOUS | Status: AC
  Administered 2018-03-14: 3.375 g via INTRAVENOUS
  Filled 2018-03-14: qty 50

## 2018-03-14 MED ORDER — TRAZODONE HCL 50 MG PO TABS
50.0000 mg | ORAL_TABLET | Freq: Once | ORAL | Status: AC
  Administered 2018-03-14: 50 mg via ORAL
  Filled 2018-03-14: qty 1

## 2018-03-14 NOTE — Progress Notes (Signed)
Pharmacy Antibiotic Note  Erin Clay is a 38 y.o. female admitted on 03/12/2018 with fever.  Pharmacy has been consulted for vancomycin and piperacillin/tazobactam dosing. NKDA.  Pt has PMH significant for RA on Plaquenil, sulfasalazine, and prednisone PTA. Presenting with fevers, SOB, and malaise, and swollen lymph nodes. Pt underwent lymph node biopsy on 2/14.  ID consulted and following.   Today, 03/14/18  WBC 17.8 - elevated, increasing  SCr 0.74, CrCl ~100 mL/min  Tmax 103.1 F  Not currently on antibiotics.   Plan:  Piperacillin/tazobactam 3.375 g IV initial dose over 30 mins followed by 3.375 g IV q8h EI  Vancomycin 2000 mg loading dose followed by 1000 mg IV q12h  Goal AUC 400-500  Monitor renal function, culture data, clinical course  Check vancomycin levels once at steady state if indicated  Height: 5\' 7"  (170.2 cm) Weight: 199 lb 1.2 oz (90.3 kg) IBW/kg (Calculated) : 61.6  Temp (24hrs), Avg:101.3 F (38.5 C), Min:98.3 F (36.8 C), Max:103.1 F (39.5 C)  Recent Labs  Lab 03/09/18 0843 03/12/18 0844 03/12/18 1423 03/12/18 1913 03/13/18 0622 03/14/18 0557  WBC 8.6 10.9*  --   --  15.9* 17.8*  CREATININE 0.84 0.60  --   --  0.47 0.74  LATICACIDVEN 1.6  --  2.0* 2.1*  --   --     Estimated Creatinine Clearance: 112.2 mL/min (by C-G formula based on SCr of 0.74 mg/dL).    No Known Allergies  Antimicrobials this admission: vancomycin 2/15 >>  Piperacillin/tazobactam 2/15 >>   Dose adjustments this admission:  Microbiology results: 2/13 BCx: ngtd 2/13 UCx: NGF   Thank you for allowing pharmacy to be a part of this patient's care.  Lenis Noon, PharmD 03/14/2018 5:07 PM

## 2018-03-14 NOTE — Progress Notes (Signed)
1 Day Post-Op   Subjective/Chief Complaint: No pain from surgical site. Feels restless   Objective: Vital signs in last 24 hours: Temp:  [98.1 F (36.7 C)-103.1 F (39.5 C)] 103 F (39.4 C) (02/15 0703) Pulse Rate:  [103-141] 135 (02/15 0703) Resp:  [16-27] 20 (02/15 0703) BP: (106-128)/(59-79) 114/59 (02/15 0703) SpO2:  [95 %-100 %] 99 % (02/15 0703) Weight:  [90.3 kg] 90.3 kg (02/15 0602) Last BM Date: 03/12/18  Intake/Output from previous day: 02/14 0701 - 02/15 0700 In: 1643.5 [I.V.:1543.5; IV Piggyback:100] Out: 5 [Blood:5] Intake/Output this shift: No intake/output data recorded.  Alert, diaphoretic OR dressing clean and dry, no swelling or warmth to surgical site in R axilla  Lab Results:  Recent Labs    03/13/18 0622 03/14/18 0557  WBC 15.9* 17.8*  HGB 11.2* 9.7*  HCT 37.5 32.1*  PLT 269 298   BMET Recent Labs    03/13/18 0622 03/14/18 0557  NA 139 138  K 4.7 3.3*  CL 107 106  CO2 22 22  GLUCOSE 113* 93  BUN <5* 5*  CREATININE 0.47 0.74  CALCIUM 8.6* 8.4*   PT/INR No results for input(s): LABPROT, INR in the last 72 hours. ABG No results for input(s): PHART, HCO3 in the last 72 hours.  Invalid input(s): PCO2, PO2  Studies/Results: Dg Chest 2 View  Result Date: 03/12/2018 CLINICAL DATA:  Dyspnea and fever EXAM: CHEST - 2 VIEW COMPARISON:  Three days ago FINDINGS: Mildly low lung volumes with interstitial crowding. There is no edema, consolidation, effusion, or pneumothorax. Normal heart size and mediastinal contours. IMPRESSION: Low volume chest with mild atelectasis. Electronically Signed   By: Monte Fantasia M.D.   On: 03/12/2018 10:37   Ct Angio Chest Pe W And/or Wo Contrast  Result Date: 03/12/2018 CLINICAL DATA:  PE suspected EXAM: CT ANGIOGRAPHY CHEST WITH CONTRAST TECHNIQUE: Multidetector CT imaging of the chest was performed using the standard protocol during bolus administration of intravenous contrast. Multiplanar CT image  reconstructions and MIPs were obtained to evaluate the vascular anatomy. CONTRAST:  186mL ISOVUE-370 IOPAMIDOL (ISOVUE-370) INJECTION 76% COMPARISON:  Same day chest radiograph FINDINGS: Cardiovascular: Satisfactory opacification of the pulmonary arteries to the segmental level. No evidence of pulmonary embolism. Normal heart size. No pericardial effusion. Mediastinum/Nodes: Non-specific enlarged bilateral axillary lymph nodes, largest nodes in the right axilla measuring 2.8 cm. Thyroid gland, trachea, and esophagus demonstrate no significant findings. Lungs/Pleura: Bibasilar scarring or atelectasis. No pleural effusion or pneumothorax. Upper Abdomen: No acute abnormality. Musculoskeletal: No chest wall abnormality. No acute or significant osseous findings. Review of the MIP images confirms the above findings. IMPRESSION: 1.  Negative examination for pulmonary embolism. 2. Bibasilar scarring or atelectasis. 3. Non-specific enlarged bilateral axillary lymph nodes, largest nodes in the right axilla measuring 2.8 cm. Electronically Signed   By: Eddie Candle M.D.   On: 03/12/2018 15:55   Ct Abdomen Pelvis W Contrast  Result Date: 03/12/2018 CLINICAL DATA:  Fever of unknown origin EXAM: CT ABDOMEN AND PELVIS WITH CONTRAST TECHNIQUE: Multidetector CT imaging of the abdomen and pelvis was performed using the standard protocol following bolus administration of intravenous contrast. CONTRAST:  16mL ISOVUE-370 IOPAMIDOL (ISOVUE-370) INJECTION 76% COMPARISON:  Right upper quadrant ultrasound, 03/09/2018. FINDINGS: Lower chest: Please see separately reported CT examination of the chest. Hepatobiliary: No focal liver abnormality is seen. Minimal sludge in the dependent gallbladder. No gallbladder wall thickening, or biliary dilatation. Pancreas: Unremarkable. No pancreatic ductal dilatation or surrounding inflammatory changes. Spleen: Normal in size without focal  abnormality. Adrenals/Urinary Tract: Adrenal glands are  unremarkable. Kidneys are normal, without renal calculi, focal lesion, or hydronephrosis. Bladder is unremarkable. Stomach/Bowel: Stomach is within normal limits. Appendix appears normal. No evidence of bowel wall thickening, distention, or inflammatory changes. Vascular/Lymphatic: No significant vascular findings are present. No enlarged abdominal or pelvic lymph nodes. Reproductive: No mass or other abnormality. Other: No abdominal wall hernia or abnormality. No abdominopelvic ascites. Musculoskeletal: No acute or significant osseous findings. IMPRESSION: 1. Minimal sludge in the dependent gallbladder, as seen on prior ultrasound examination. No acute findings. 2. No acute CT findings in the abdomen or pelvis to explain fever. Normal appendix. Electronically Signed   By: Eddie Candle M.D.   On: 03/12/2018 16:00    Anti-infectives: Anti-infectives (From admission, onward)   Start     Dose/Rate Route Frequency Ordered Stop   03/13/18 1322  ceFAZolin (ANCEF) 2-4 GM/100ML-% IVPB    Note to Pharmacy:  Mardelle Matte   : cabinet override      03/13/18 1322 03/13/18 1551   03/13/18 1130  ceFAZolin (ANCEF) IVPB 2g/100 mL premix     2 g 200 mL/hr over 30 Minutes Intravenous On call to O.R. 03/13/18 1125 03/13/18 1556   03/12/18 2000  hydroxychloroquine (PLAQUENIL) tablet 400 mg     400 mg Oral Daily 03/12/18 1829        Assessment/Plan: s/p Procedure(s): AXILLARY LYMPH NODE BIOPSY (Right) 03/13/18 Dr. Hassell Done Await path/ culture results Remove OR dressing tomorrow  Surgery will sign off   LOS: 1 day    Erin Clay 03/14/2018

## 2018-03-14 NOTE — Progress Notes (Addendum)
   03/14/18 1927  Vitals  Temp (!) 101.9 F (38.8 C)  Temp Source Oral  BP 135/68  MAP (mmHg) 84  BP Location Right Arm  BP Method Automatic  Patient Position (if appropriate) Lying  Pulse Rate (!) 152  Resp (!) 22  Oxygen Therapy  SpO2 97 %  O2 Device Room Air  MEWS Score  MEWS RR 1  MEWS Pulse 3  MEWS Systolic 0  MEWS LOC 0  MEWS Temp 2  MEWS Score 6  MEWS Score Color Red  RRN notified. PRN tylenol given. Pt is shivering in bed, diaphoretic, flushed skin. AOx4. Reports dizziness, bed alarm on and informed to call RN/NT to ambulate. No c/o pain.    2040 On Call NP [facility] paged for interventions.  Kizzie Ide, RN

## 2018-03-14 NOTE — Progress Notes (Signed)
  Echocardiogram 2D Echocardiogram has been performed.  Ashish Rossetti L Androw 03/14/2018, 10:45 AM

## 2018-03-14 NOTE — Progress Notes (Addendum)
[facility]                                                                              Patient Demographics  Erin Clay, is a 38 y.o. female, DOB - 16-Jun-1980, [place] date - 03/12/2018   Admitting Physician A Melven Sartorius., MD  Outpatient Primary MD for the patient is Patient, No Pcp Per  Outpatient specialists:   LOS - 1  days   Medical records reviewed and are as summarized below:    Chief Complaint  Patient presents with  . Facial Swelling  . Dizziness  . Weakness  . Pruritis       Brief summary   Patient is a 38 year old female with history of rheumatoid arthritis on Plaquenil, sulfasalazine, prednisone, diagnosed in December 2019 presented to ED with fevers, shortness of breath, malaise and lymphadenopathy.  History was obtained from the patient who noted travel to [place] for a month and returned in October, 2019.  Subsequently she she had a joint pains, saw rheumatology and was diagnosed with rheumatoid arthritis.  In the last 2 weeks, patient started having fevers, high up to 103 F, also noticed swollen lymph nodes in her neck area, decreased appetite.  In the last 3 days patient started having significant shortness of breath, felt her face was swollen.  She felt shortness of breath worsening with exertion otherwise denied any rash or abdominal pain, no sick contacts.  Per patient, she did not travel to remote places encounter, stated in the city and ate in the restaurants (mostly fine dining).    Assessment & Plan    Principal Problem: Dyspnea with hypoxia (HCC) with fever of unknown origin -Continue to spike fevers overnight with sweating, unclear etiology - CT angiogram of the chest negative for PE -Complete work-up for FUO, negative flu on 2/10, blood cultures 2/10- so far, HIV negative, CMV IgG elevated but IgM negative.  EBV IgM negative.  TSH normal -2D echo pending to rule out cardiomyopathy (?  Viral  pericarditis) -Even though she has rheumatoid arthritis, low suspicion of Felty syndrome given no neutropenia or splenomegaly -Status post excisional biopsy of the axillary lymph node (rule out lymphoma) on 2/14, path pending.  AFB fungal cultures, lymphoma path sent -CRP 7.6, ESR normal, LDH 702 -ID following Addendum 4:45pm Patient continues to spike fevers, tmax 103 F, tachycardiac 132, BP 94/58, worsening leukocytosis, patient meets criteria for sepsis -Unclear etiology, will empirically place patient on broad-spectrum antibiotics including vancomycin and Zosyn.  Blood cultures have been negative so far -Placed on IV fluid hydration with normal saline - obtain procalcitonin, lactic acid  Active Problems:  Transaminitis -Unclear etiology, HIV, hepatitis panel negative, EBV negative -LFTs improving -Ultrasound abdomen showed mild sludge in the gallbladder without wall thickening, pericholecystic fluid or Murphy sign, CT abdomen and pelvis showed no acute findings   Hypotension -BP borderline with tachycardia, due to underlying pathology -Currently alert and oriented, hold off IV fluids    Rheumatoid arthritis (HCC) -Continue Plaquenil, sulfasalazine, prednisone  Hypokalemia Replace  Code Status: Full CODE STATUS DVT Prophylaxis:   SCD's Family Communication: Discussed in detail with the  patient, all imaging results, lab results explained to the patient and mother   Disposition Plan: Needs inpatient work-up for FUO, dyspnea  Time Spent in minutes 35 minutes  Procedures:  CT abdomen pelvis Right axillary lymph node biopsy  Consultants:   ID General surgery  Antimicrobials:   Anti-infectives (From admission, onward)   Start     Dose/Rate Route Frequency Ordered Stop   03/13/18 1322  ceFAZolin (ANCEF) 2-4 GM/100ML-% IVPB    Note to Pharmacy:  Mardelle Matte   : cabinet override      03/13/18 1322 03/13/18 1551   03/13/18 1130  ceFAZolin (ANCEF) IVPB 2g/100 mL  premix     2 g 200 mL/hr over 30 Minutes Intravenous On call to O.R. 03/13/18 1125 03/13/18 1556   03/12/18 2000  hydroxychloroquine (PLAQUENIL) tablet 400 mg     400 mg Oral Daily 03/12/18 1829           Medications  Scheduled Meds: . hydroxychloroquine  400 mg Oral Daily  . predniSONE  5 mg Oral QAC breakfast  . sulfaSALAzine  500 mg Oral BID   Continuous Infusions: . lactated ringers Stopped (03/14/18 0849)   PRN Meds:.acetaminophen **OR** acetaminophen, diphenhydrAMINE, oxyCODONE      Subjective:   Erin Clay was seen and examined today.  Very anxious, overnight kept on spiking fevers, tachycardia, borderline BP.  Mother at the bedside also very anxious about the diagnosis.  No chest pain.  Patient denies dizziness,  abdominal pain, N/V/D/C, new weakness, numbess, tingling.  Overnight issues noted  Objective:   Vitals:   03/14/18 0847 03/14/18 0848 03/14/18 1011 03/14/18 1130  BP: (!) 102/46  102/74 105/65  Pulse: (!) 129 (!) 129 (!) 115 (!) 108  Resp: '20  20 20  '$ Temp: (!) 101.9 F (38.8 C) (!) 101.9 F (38.8 C) 98.9 F (37.2 C)   TempSrc: Oral Oral    SpO2: 94% 96% 100% 97%  Weight:      Height:        Intake/Output Summary (Last 24 hours) at 03/14/2018 1152 Last data filed at 03/14/2018 0900 Gross per 24 hour  Intake 1918.58 ml  Output 5 ml  Net 1913.58 ml     Wt Readings from Last 3 Encounters:  03/14/18 90.3 kg  03/09/18 81.2 kg  09/25/17 83.5 kg    Physical Exam  General: Alert and oriented x 3, NAD  Eyes:   HEENT:    Cardiovascular: S1 S2 clear, tachycardiac, RRR. No pedal edema b/l  Respiratory: CTAB, no wheezing, rales or rhonchi  Gastrointestinal: Soft, nontender, nondistended, NBS  Ext: no pedal edema bilaterally  Neuro: no new deficits  Musculoskeletal: No cyanosis, clubbing  Skin: No rashes  Psych: Anxious    Data Reviewed:  I have personally reviewed following labs and imaging studies  Micro Results Recent  Results (from the past 240 hour(s))  Blood Culture (routine x 2)     Status: None   Collection Time: 03/09/18  8:42 AM  Result Value Ref Range Status   Specimen Description BLOOD RIGHT ANTECUBITAL  Final   Special Requests   Final    BOTTLES DRAWN AEROBIC AND ANAEROBIC Blood Culture adequate volume   Culture   Final    NO GROWTH 5 DAYS Performed at [facility], [address] [place], [place] [address]    Report Status 03/14/2018 FINAL  Final  Blood Culture (routine x 2)     Status: None   Collection Time: 03/09/18  8:55 AM  Result Value Ref Range Status   Specimen Description BLOOD RIGHT HAND  Final   Special Requests   Final    BOTTLES DRAWN AEROBIC AND ANAEROBIC Blood Culture adequate volume   Culture   Final    NO GROWTH 5 DAYS Performed at [facility], [address] [place], [place] [address]    Report Status 03/14/2018 FINAL  Final  Group A Strep by PCR     Status: None   Collection Time: 03/09/18 12:10 PM  Result Value Ref Range Status   Group A Strep by PCR NOT DETECTED NOT DETECTED Final    Comment: Performed at [facility], [place] [address], [place], [place] [address]  Culture, blood (routine x 2)     Status: None (Preliminary result)   Collection Time: 03/12/18  9:06 AM  Result Value Ref Range Status   Specimen Description   Final    BLOOD LEFT ANTECUBITAL Performed at [facility] [address], [place], [place] [address]    Special Requests   Final    BOTTLES DRAWN AEROBIC AND ANAEROBIC Blood Culture results may not be optimal due to an excessive volume of blood received in culture bottles Performed at [facility] [address] [place], [place] [address]    Culture   Final    NO GROWTH 2 DAYS Performed at [facility] [address], [place], [place] [address]    Report Status PENDING  Incomplete  Culture, blood (routine x 2)     Status: None (Preliminary result)   Collection Time:  03/12/18  9:45 AM  Result Value Ref Range Status   Specimen Description   Final    BLOOD RIGHT ANTECUBITAL Performed at [facility] [address], [place], [place] [address]    Special Requests   Final    BOTTLES DRAWN AEROBIC AND ANAEROBIC Blood Culture adequate volume Performed at [facility] [address] [place], [place] [address]    Culture   Final    NO GROWTH 2 DAYS Performed at [facility] [address], [place], [place] [address]    Report Status PENDING  Incomplete  Culture, Urine     Status: None   Collection Time: 03/12/18 11:42 AM  Result Value Ref Range Status   Specimen Description   Final    URINE, CLEAN CATCH Performed at [facility], [place] [address], [place], [place] [address]    Special Requests   Final    NONE Performed at [facility], [place] [address] [place], [place] [address]    Culture   Final    NO GROWTH Performed at [facility], [place] [address], [place], [place] [address]    Report Status 03/13/2018 FINAL  Final    Radiology Reports Dg Chest 2 View  Result Date: 03/12/2018 CLINICAL DATA:  Dyspnea and fever EXAM: CHEST - 2 VIEW COMPARISON:  Three days ago FINDINGS: Mildly low lung volumes with interstitial crowding. There is no edema, consolidation, effusion, or pneumothorax. Normal heart size and mediastinal contours. IMPRESSION: Low volume chest with mild atelectasis. Electronically Signed   By: Monte Fantasia M.D.   On: 03/12/2018 10:37   Dg Chest 2 View  Result Date: 03/09/2018 CLINICAL DATA:  Fever EXAM: CHEST - 2 VIEW COMPARISON:  None. FINDINGS: Lungs are clear. Heart size and pulmonary vascularity are normal. No adenopathy. No bone  lesions. IMPRESSION: No edema or consolidation. Electronically Signed   By: Lowella Grip III M.D.   On: 03/09/2018 14:02   Ct Angio Chest Pe W And/or Wo Contrast  Result Date: 03/12/2018 CLINICAL DATA:  PE  suspected EXAM: CT ANGIOGRAPHY CHEST WITH CONTRAST TECHNIQUE: Multidetector CT imaging of the chest was performed using the standard protocol during bolus administration of intravenous contrast. Multiplanar CT image reconstructions and MIPs were obtained to evaluate the vascular anatomy. CONTRAST:  160m ISOVUE-370 IOPAMIDOL (ISOVUE-370) INJECTION 76% COMPARISON:  Same day chest radiograph FINDINGS: Cardiovascular: Satisfactory opacification of the pulmonary arteries to the segmental level. No evidence of pulmonary embolism. Normal heart size. No pericardial effusion. Mediastinum/Nodes: Non-specific enlarged bilateral axillary lymph nodes, largest nodes in the right axilla measuring 2.8 cm. Thyroid gland, trachea, and esophagus demonstrate no significant findings. Lungs/Pleura: Bibasilar scarring or atelectasis. No pleural effusion or pneumothorax. Upper Abdomen: No acute abnormality. Musculoskeletal: No chest wall abnormality. No acute or significant osseous findings. Review of the MIP images confirms the above findings. IMPRESSION: 1.  Negative examination for pulmonary embolism. 2. Bibasilar scarring or atelectasis. 3. Non-specific enlarged bilateral axillary lymph nodes, largest nodes in the right axilla measuring 2.8 cm. Electronically Signed   By: AEddie CandleM.D.   On: 03/12/2018 15:55   Ct Abdomen Pelvis W Contrast  Result Date: 03/12/2018 CLINICAL DATA:  Fever of unknown origin EXAM: CT ABDOMEN AND PELVIS WITH CONTRAST TECHNIQUE: Multidetector CT imaging of the abdomen and pelvis was performed using the standard protocol following bolus administration of intravenous contrast. CONTRAST:  1072mISOVUE-370 IOPAMIDOL (ISOVUE-370) INJECTION 76% COMPARISON:  Right upper quadrant ultrasound, 03/09/2018. FINDINGS: Lower chest: Please see separately reported CT examination of the chest. Hepatobiliary: No focal liver abnormality is seen. Minimal sludge in the dependent gallbladder. No gallbladder wall  thickening, or biliary dilatation. Pancreas: Unremarkable. No pancreatic ductal dilatation or surrounding inflammatory changes. Spleen: Normal in size without focal abnormality. Adrenals/Urinary Tract: Adrenal glands are unremarkable. Kidneys are normal, without renal calculi, focal lesion, or hydronephrosis. Bladder is unremarkable. Stomach/Bowel: Stomach is within normal limits. Appendix appears normal. No evidence of bowel wall thickening, distention, or inflammatory changes. Vascular/Lymphatic: No significant vascular findings are present. No enlarged abdominal or pelvic lymph nodes. Reproductive: No mass or other abnormality. Other: No abdominal wall hernia or abnormality. No abdominopelvic ascites. Musculoskeletal: No acute or significant osseous findings. IMPRESSION: 1. Minimal sludge in the dependent gallbladder, as seen on prior ultrasound examination. No acute findings. 2. No acute CT findings in the abdomen or pelvis to explain fever. Normal appendix. Electronically Signed   By: AlEddie Candle.D.   On: 03/12/2018 16:00   UsKoreabdomen Limited Ruq  Result Date: 03/09/2018 CLINICAL DATA:  Elevated LFTs EXAM: ULTRASOUND ABDOMEN LIMITED RIGHT UPPER QUADRANT COMPARISON:  None. FINDINGS: Gallbladder: There is some sludge in the gallbladder with no stones, wall thickening, or pericholecystic fluid. No Murphy's sign. Common bile duct: Diameter: 3.2 mm Liver: No focal lesion identified. Within normal limits in parenchymal echogenicity. Portal vein is patent on color Doppler imaging with normal direction of blood flow towards the liver. IMPRESSION: Mild sludge in the gallbladder without wall thickening, pericholecystic fluid, or Murphy's sign. No other abnormalities. Electronically Signed   By: DaDorise BullionII M.D   On: 03/09/2018 10:51    Lab Data:  CBC: Recent Labs  Lab 03/09/18 0843 03/12/18 0844 03/13/18 0622 03/14/18 0557  WBC 8.6 10.9* 15.9* 17.8*  NEUTROABS 4.2 4.6 5.2  --   HGB 11.2*  11.4*  11.2* 9.7*  HCT 36.5 36.9 37.5 32.1*  MCV 82.0 85.0 87.2 84.5  PLT 239 245 269 915   Basic Metabolic Panel: Recent Labs  Lab 03/09/18 0843 03/12/18 0844 03/13/18 0622 03/14/18 0557  NA 133* 136 139 138  K 3.3* 2.9* 4.7 3.3*  CL 99 103 107 106  CO2 19* '23 22 22  '$ GLUCOSE 96 90 113* 93  BUN <5* <5* <5* 5*  CREATININE 0.84 0.60 0.47 0.74  CALCIUM 8.4* 8.5* 8.6* 8.4*  MG  --  2.3  --   --    GFR: Estimated Creatinine Clearance: 112.2 mL/min (by C-G formula based on SCr of 0.74 mg/dL). Liver Function Tests: Recent Labs  Lab 03/09/18 0843 03/12/18 0844 03/13/18 0622 03/14/18 0557  AST 80* 157* 192* 179*  ALT 116* 226* 285* 259*  ALKPHOS 89 150* 151* 190*  BILITOT 1.0 1.0 0.8 0.7  PROT 7.0 6.8 5.8* 5.8*  ALBUMIN 3.4* 3.4* 2.9* 3.0*   No results for input(s): LIPASE, AMYLASE in the last 168 hours. No results for input(s): AMMONIA in the last 168 hours. Coagulation Profile: No results for input(s): INR, PROTIME in the last 168 hours. Cardiac Enzymes: No results for input(s): CKTOTAL, CKMB, CKMBINDEX, TROPONINI in the last 168 hours. BNP (last 3 results) No results for input(s): PROBNP in the last 8760 hours. HbA1C: No results for input(s): HGBA1C in the last 72 hours. CBG: Recent Labs  Lab 03/12/18 0757  GLUCAP 86   Lipid Profile: No results for input(s): CHOL, HDL, LDLCALC, TRIG, CHOLHDL, LDLDIRECT in the last 72 hours. Thyroid Function Tests: Recent Labs    03/12/18 0844  TSH 1.964   Anemia Panel: No results for input(s): VITAMINB12, FOLATE, FERRITIN, TIBC, IRON, RETICCTPCT in the last 72 hours. Urine analysis:    Component Value Date/Time   COLORURINE YELLOW 03/12/2018 1142   APPEARANCEUR HAZY (A) 03/12/2018 1142   LABSPEC 1.006 03/12/2018 1142   PHURINE 6.0 03/12/2018 1142   GLUCOSEU NEGATIVE 03/12/2018 1142   HGBUR NEGATIVE 03/12/2018 1142   BILIRUBINUR NEGATIVE 03/12/2018 1142   KETONESUR 20 (A) 03/12/2018 1142   PROTEINUR NEGATIVE  03/12/2018 1142   NITRITE NEGATIVE 03/12/2018 1142   LEUKOCYTESUR TRACE (A) 03/12/2018 1142     Narda Fundora M.D. [facility] 03/14/2018, 11:52 AM  Pager: [phone] Between 7am to 7pm - call Pager - [phone]  After 7pm go to [email or web address] - password [email or web address]  Call night coverage person covering after 7pm

## 2018-03-14 NOTE — Progress Notes (Signed)
CRITICAL VALUE ALERT  Critical Value:  Lactic acid = 3.3 Date & Time Notied:  03/14/18 @ 3013  Provider Notified: Rai   Orders Received/Actions taken: MD paged. No new orders at this time

## 2018-03-14 NOTE — Significant Event (Signed)
Rapid Response Event Note  Overview: Time Called: 1954 Arrival Time: 2000 Event Type: MEWS  Initial Focused Assessment:  Called by primary nurse d/t MEWS is a 6 and turned red, d/t elevated HR, and fever.  Pt currently resting in bed, no signs of distress.  T 101.9, feels hot, HR 130s sinus tach on monitor, RR 22 with Sats 98% on 2L-[place], lungs clear but decreased in bases.  Strong (+2) radial and pedal pulses.  BP 124/73, denies any current pain, Abd non-tender to palpation.  Last Lactic Acid was 3.3 trending up from 2.1 yesterday, which was called to Dr. Tana Coast prior to me being notified. WBC also trending up to 17.8.  Review of chart noted:  On 2/13, presented to ED with fevers, shortness of breath, malaise and lymphadenopathy. For last 2 weeks, patient started having fevers, high up to 103 F, also noticed swollen lymph nodes in her neck area, decreased appetite.  Hx: recent travel to [place] for a month and returned in October, 2019, rheumatoid arthritis on Plaquenil, sulfasalazine, prednisone, diagnosed in December 2019.  Pt has current orders for Vanc and Zosyn   Interventions:  Rapid Response RN assessement and recommendations  Plan of Care (if not transferred):  Pt at present stable.  Notify current NP on-call regarding if they would want to alternate tylenol with motrin for fever control or if they want to add Ice to [place].  Need a repeat LA.  Ask about any further IV fluid boluses?   If any deterioration in patient's condition please call rapid response nurse back at [phone].   Event Summary: Name of Physician Notified: Dr. Tana Coast at [phone]    at    Outcome: Stayed in room and [facility] [place] / Rapid Response Nurse Rapid Response Number:  [phone]

## 2018-03-15 DIAGNOSIS — R509 Fever, unspecified: Secondary | ICD-10-CM | POA: Diagnosis not present

## 2018-03-15 DIAGNOSIS — E872 Acidosis: Secondary | ICD-10-CM | POA: Diagnosis not present

## 2018-03-15 DIAGNOSIS — F329 Major depressive disorder, single episode, unspecified: Secondary | ICD-10-CM

## 2018-03-15 DIAGNOSIS — R590 Localized enlarged lymph nodes: Secondary | ICD-10-CM

## 2018-03-15 DIAGNOSIS — D649 Anemia, unspecified: Secondary | ICD-10-CM | POA: Diagnosis not present

## 2018-03-15 DIAGNOSIS — R748 Abnormal levels of other serum enzymes: Secondary | ICD-10-CM | POA: Diagnosis not present

## 2018-03-15 DIAGNOSIS — M069 Rheumatoid arthritis, unspecified: Secondary | ICD-10-CM | POA: Diagnosis not present

## 2018-03-15 DIAGNOSIS — F419 Anxiety disorder, unspecified: Secondary | ICD-10-CM

## 2018-03-15 DIAGNOSIS — G4700 Insomnia, unspecified: Secondary | ICD-10-CM

## 2018-03-15 LAB — CBC
HCT: 29.1 % — ABNORMAL LOW (ref 36.0–46.0)
Hemoglobin: 9.2 g/dL — ABNORMAL LOW (ref 12.0–15.0)
MCH: 25.8 pg — AB (ref 26.0–34.0)
MCHC: 31.6 g/dL (ref 30.0–36.0)
MCV: 81.7 fL (ref 80.0–100.0)
Platelets: 292 10*3/uL (ref 150–400)
RBC: 3.56 MIL/uL — ABNORMAL LOW (ref 3.87–5.11)
RDW: 17.1 % — ABNORMAL HIGH (ref 11.5–15.5)
WBC: 37.3 10*3/uL — ABNORMAL HIGH (ref 4.0–10.5)
nRBC: 0.8 % — ABNORMAL HIGH (ref 0.0–0.2)

## 2018-03-15 LAB — COMPREHENSIVE METABOLIC PANEL
ALT: 222 U/L — ABNORMAL HIGH (ref 0–44)
AST: 226 U/L — ABNORMAL HIGH (ref 15–41)
Albumin: 2.6 g/dL — ABNORMAL LOW (ref 3.5–5.0)
Alkaline Phosphatase: 220 U/L — ABNORMAL HIGH (ref 38–126)
Anion gap: 9 (ref 5–15)
BUN: 7 mg/dL (ref 6–20)
CO2: 18 mmol/L — ABNORMAL LOW (ref 22–32)
CREATININE: 0.77 mg/dL (ref 0.44–1.00)
Calcium: 7.5 mg/dL — ABNORMAL LOW (ref 8.9–10.3)
Chloride: 104 mmol/L (ref 98–111)
GFR calc non Af Amer: >60 mL/min (ref >60)
Glucose, Bld: 93 mg/dL (ref 70–99)
Potassium: 3.4 mmol/L — ABNORMAL LOW (ref 3.5–5.1)
Sodium: 131 mmol/L — ABNORMAL LOW (ref 135–145)
Total Bilirubin: 1.7 mg/dL — ABNORMAL HIGH (ref 0.3–1.2)
Total Protein: 5 g/dL — ABNORMAL LOW (ref 6.5–8.1)

## 2018-03-15 MED ORDER — SODIUM CHLORIDE 0.9 % IV BOLUS
500.0000 mL | Freq: Once | INTRAVENOUS | Status: AC
  Administered 2018-03-15: 500 mL via INTRAVENOUS

## 2018-03-15 MED ORDER — IBUPROFEN 200 MG PO TABS: 400.0000 mg | ORAL_TABLET | ORAL | Status: DC | PRN

## 2018-03-15 MED ORDER — CLONAZEPAM 0.5 MG PO TABS
0.2500 mg | ORAL_TABLET | Freq: Two times a day (BID) | ORAL | Status: DC
  Administered 2018-03-15 – 2018-03-19 (×9): 0.25 mg via ORAL
  Filled 2018-03-15 (×9): qty 1

## 2018-03-15 MED ORDER — METOPROLOL TARTRATE 5 MG/5ML IV SOLN
5.0000 mg | Freq: Once | INTRAVENOUS | Status: AC
  Administered 2018-03-15: 5 mg via INTRAVENOUS
  Filled 2018-03-15: qty 5

## 2018-03-15 MED ORDER — IBUPROFEN 200 MG PO TABS
400.0000 mg | ORAL_TABLET | ORAL | Status: DC | PRN
  Administered 2018-03-15 – 2018-03-16 (×3): 400 mg via ORAL
  Filled 2018-03-15 (×3): qty 2

## 2018-03-15 MED ORDER — METOPROLOL TARTRATE 5 MG/5ML IV SOLN
2.5000 mg | Freq: Four times a day (QID) | INTRAVENOUS | Status: DC
  Administered 2018-03-15 – 2018-03-19 (×12): 2.5 mg via INTRAVENOUS
  Filled 2018-03-15 (×12): qty 5

## 2018-03-15 NOTE — Progress Notes (Signed)
   03/15/18 0055  Vitals  Temp (!) 102.3 F (39.1 C)  Temp Source Oral  BP 124/77  MAP (mmHg) 89  BP Location Right Arm  BP Method Automatic  Patient Position (if appropriate) Lying  Pulse Rate (!) 135  Resp (!) 21  Oxygen Therapy  SpO2 97 %  O2 Device Room Air  MEWS Score  MEWS RR 1  MEWS Pulse 3  MEWS Systolic 0  MEWS LOC 0  MEWS Temp 2  MEWS Score 6  MEWS Score Color Red  Will page On Call [place], PRN Tylenol due 0130.

## 2018-03-15 NOTE — Progress Notes (Addendum)
[facility]                                                                              Patient Demographics  Erin Clay, is a 38 y.o. female, DOB - 1980-05-29, [place] date - 03/12/2018   Admitting Physician A Melven Sartorius., MD  Outpatient Primary MD for the patient is Patient, No Pcp Per  Outpatient specialists:   LOS - 2  days   Medical records reviewed and are as summarized below:    Chief Complaint  Patient presents with  . Facial Swelling  . Dizziness  . Weakness  . Pruritis       Brief summary   Patient is a 37 year old female with history of rheumatoid arthritis on Plaquenil, sulfasalazine, prednisone, diagnosed in December 2019 presented to ED with fevers, shortness of breath, malaise and lymphadenopathy.  History was obtained from the patient who noted travel to [place] for a month and returned in October, 2019.  Subsequently she she had a joint pains, saw rheumatology and was diagnosed with rheumatoid arthritis.  In the last 2 weeks, patient started having fevers, high up to 103 F, also noticed swollen lymph nodes in her neck area, decreased appetite.  In the last 3 days patient started having significant shortness of breath, felt her face was swollen.  She felt shortness of breath worsening with exertion otherwise denied any rash or abdominal pain, no sick contacts.  Per patient, she did not travel to remote places encounter, stated in the city and ate in the restaurants (mostly fine dining).    Assessment & Plan    Principal Problem: Dyspnea with hypoxia (HCC) with fever of unknown origin -Continue to spike fevers overnight with sweating, unclear etiology - CT angiogram of the chest negative for PE -Complete work-up for FUO, negative flu on 2/10, blood cultures 2/10- so far, HIV negative, CMV IgG elevated but IgM negative.  EBV IgM negative.  TSH normal -2D echo showed EF of 60 to 65%, no regional wall motion abnormalities,  no pericardial effusion -Even though she has rheumatoid arthritis, low suspicion of Felty syndrome given no neutropenia or splenomegaly -Status post excisional biopsy of the axillary lymph node (rule out lymphoma) on 2/14, path pending.  AFB fungal cultures, lymphoma path sent, results pending -CRP 7.6, ESR normal, LDH 702 -ID was consulted on 03/13/2018, discussed with Dr. Drucilla Schmidt today, will see her for any further recommendations -Patient continues to spike fevers, T-max 103.2 F with tachycardia, placed on IV vancomycin and Zosyn empirically on 03/14/18, donot think she needs them, pro calcitonin low -Blood cultures negative so far   Active Problems:  Transaminitis -Unclear etiology, HIV, hepatitis panel negative, EBV negative -LFTs improving -Ultrasound abdomen showed mild sludge in the gallbladder without wall thickening, pericholecystic fluid or Murphy sign, CT abdomen and pelvis showed no acute findings   Hypotension with tachycardia -BP currently stable, placed on low-dose Lopressor    Rheumatoid arthritis (HCC) -Continue Plaquenil, sulfasalazine, prednisone   Code Status: Full CODE STATUS DVT Prophylaxis:   SCD's Family Communication: Discussed in detail with the patient, all imaging results, lab results explained to the  patient   Disposition Plan: Needs inpatient work-up for FUO, awaiting biopsy results  Time Spent in minutes 35 minutes  Procedures:  CT abdomen pelvis Right axillary lymph node biopsy 03/13/2018  Consultants:   ID General surgery  Antimicrobials:   Anti-infectives (From admission, onward)   Start     Dose/Rate Route Frequency Ordered Stop   03/15/18 0600  vancomycin (VANCOCIN) IVPB 1000 mg/200 mL premix     1,000 mg 200 mL/hr over 60 Minutes Intravenous Every 12 hours 03/14/18 1707     03/14/18 2300  piperacillin-tazobactam (ZOSYN) IVPB 3.375 g     3.375 g 12.5 mL/hr over 240 Minutes Intravenous Every 8 hours 03/14/18 1702     03/14/18 1800   vancomycin (VANCOCIN) 2,000 mg in sodium chloride 0.9 % 500 mL IVPB     2,000 mg 250 mL/hr over 120 Minutes Intravenous  Once 03/14/18 1658 03/14/18 2022   03/14/18 1715  piperacillin-tazobactam (ZOSYN) IVPB 3.375 g     3.375 g 100 mL/hr over 30 Minutes Intravenous STAT 03/14/18 1655 03/14/18 1758   03/13/18 1322  ceFAZolin (ANCEF) 2-4 GM/100ML-% IVPB    Note to Pharmacy:  Mardelle Matte   : cabinet override      03/13/18 1322 03/13/18 1551   03/13/18 1130  ceFAZolin (ANCEF) IVPB 2g/100 mL premix     2 g 200 mL/hr over 30 Minutes Intravenous On call to O.R. 03/13/18 1125 03/13/18 1556   03/12/18 2000  hydroxychloroquine (PLAQUENIL) tablet 400 mg     400 mg Oral Daily 03/12/18 1829           Medications  Scheduled Meds: . clonazePAM  0.25 mg Oral BID  . hydroxychloroquine  400 mg Oral Daily  . metoprolol tartrate  2.5 mg Intravenous Q6H  . metoprolol tartrate  5 mg Intravenous Once  . predniSONE  5 mg Oral QAC breakfast  . sulfaSALAzine  500 mg Oral BID   Continuous Infusions: . sodium chloride 75 mL/hr at 03/14/18 2207  . piperacillin-tazobactam (ZOSYN)  IV 3.375 g (03/15/18 0541)  . vancomycin 1,000 mg (03/15/18 0542)   PRN Meds:.acetaminophen **OR** acetaminophen, diphenhydrAMINE, oxyCODONE      Subjective:   Erin Clay was seen and examined today.  Anxious, continues to spike fevers, T-max 103.2 F tachycardiac.  No chest pain.  Patient denies dizziness,  abdominal pain, N/V/D/C, new weakness, numbess, tingling.   Objective:   Vitals:   03/15/18 0258 03/15/18 0532 03/15/18 0900 03/15/18 0943  BP: 116/62 (!) 125/96  130/77  Pulse: (!) 130 (!) 146  (!) 117  Resp: 20 20    Temp: 97.6 F (36.4 C) (!) 103 F (39.4 C) (!) 101.6 F (38.7 C)   TempSrc: Oral Oral Oral   SpO2: 97% 95%    Weight:  93.7 kg    Height:        Intake/Output Summary (Last 24 hours) at 03/15/2018 1011 Last data filed at 03/15/2018 0327 Gross per 24 hour  Intake 1218.84 ml    Output -  Net 1218.84 ml     Wt Readings from Last 3 Encounters:  03/15/18 93.7 kg  03/09/18 81.2 kg  09/25/17 83.5 kg   Physical Exam  General: Alert and oriented x 3, NAD  Eyes:   HEENT:  Atraumatic, normocephalic  Cardiovascular: S1 S2 clear, tachycardia, RRR  Respiratory: Decreased breath sound at the bases  Gastrointestinal: Soft, nontender, nondistended, NBS  Ext: no pedal edema bilaterally  Neuro: no new deficits  Musculoskeletal: No cyanosis,  clubbing  Skin: No rashes  Psych: Anxious   Data Reviewed:  I have personally reviewed following labs and imaging studies  Micro Results Recent Results (from the past 240 hour(s))  Blood Culture (routine x 2)     Status: None   Collection Time: 03/09/18  8:42 AM  Result Value Ref Range Status   Specimen Description BLOOD RIGHT ANTECUBITAL  Final   Special Requests   Final    BOTTLES DRAWN AEROBIC AND ANAEROBIC Blood Culture adequate volume   Culture   Final    NO GROWTH 5 DAYS Performed at [facility], [address] [place], [place] [address]    Report Status 03/14/2018 FINAL  Final  Blood Culture (routine x 2)     Status: None   Collection Time: 03/09/18  8:55 AM  Result Value Ref Range Status   Specimen Description BLOOD RIGHT HAND  Final   Special Requests   Final    BOTTLES DRAWN AEROBIC AND ANAEROBIC Blood Culture adequate volume   Culture   Final    NO GROWTH 5 DAYS Performed at [facility], [place] [address], [place], [place] [address]    Report Status 03/14/2018 FINAL  Final  Group A Strep by PCR     Status: None   Collection Time: 03/09/18 12:10 PM  Result Value Ref Range Status   Group A Strep by PCR NOT DETECTED NOT DETECTED Final    Comment: Performed at [facility], [place] [address] [place], [place] [address]  Culture, blood (routine x 2)     Status: None (Preliminary result)   Collection Time: 03/12/18  9:06 AM  Result Value Ref Range Status   Specimen Description    Final    BLOOD LEFT ANTECUBITAL Performed at [facility] [address], [place], [place] [address]    Special Requests   Final    BOTTLES DRAWN AEROBIC AND ANAEROBIC Blood Culture results may not be optimal due to an excessive volume of blood received in culture bottles Performed at [facility] [address], [place], [place] [address]    Culture   Final    NO GROWTH 3 DAYS Performed at [facility], [place] [address] [place], [place] [address]    Report Status PENDING  Incomplete  Culture, blood (routine x 2)     Status: None (Preliminary result)   Collection Time: 03/12/18  9:45 AM  Result Value Ref Range Status   Specimen Description   Final    BLOOD RIGHT ANTECUBITAL Performed at [facility] [address], [place], [place] [address]    Special Requests   Final    BOTTLES DRAWN AEROBIC AND ANAEROBIC Blood Culture adequate volume Performed at [facility] [address] [place], [place] [address]    Culture   Final    NO GROWTH 3 DAYS Performed at [facility], [place] [address], [place], [place] [address]    Report Status PENDING  Incomplete  Culture, Urine     Status: None   Collection Time: 03/12/18 11:42 AM  Result Value Ref Range Status   Specimen Description   Final    URINE, CLEAN CATCH Performed at [facility], [place] [address] [place], [place] [address]    Special Requests   Final    NONE Performed at [facility], [place] [address] [place],  [address]    Culture  Final    NO GROWTH Performed at [facility], [place] [address], [place], [place] [address]    Report Status 03/13/2018 FINAL  Final    Radiology Reports Dg Chest 2 View  Result Date: 03/12/2018 CLINICAL DATA:  Dyspnea and fever EXAM: CHEST - 2 VIEW COMPARISON:  Three days ago FINDINGS: Mildly low lung volumes with interstitial crowding. There is no edema,  consolidation, effusion, or pneumothorax. Normal heart size and mediastinal contours. IMPRESSION: Low volume chest with mild atelectasis. Electronically Signed   By: Monte Fantasia M.D.   On: 03/12/2018 10:37   Dg Chest 2 View  Result Date: 03/09/2018 CLINICAL DATA:  Fever EXAM: CHEST - 2 VIEW COMPARISON:  None. FINDINGS: Lungs are clear. Heart size and pulmonary vascularity are normal. No adenopathy. No bone lesions. IMPRESSION: No edema or consolidation. Electronically Signed   By: Lowella Grip III M.D.   On: 03/09/2018 14:02   Ct Angio Chest Pe W And/or Wo Contrast  Result Date: 03/12/2018 CLINICAL DATA:  PE suspected EXAM: CT ANGIOGRAPHY CHEST WITH CONTRAST TECHNIQUE: Multidetector CT imaging of the chest was performed using the standard protocol during bolus administration of intravenous contrast. Multiplanar CT image reconstructions and MIPs were obtained to evaluate the vascular anatomy. CONTRAST:  119m ISOVUE-370 IOPAMIDOL (ISOVUE-370) INJECTION 76% COMPARISON:  Same day chest radiograph FINDINGS: Cardiovascular: Satisfactory opacification of the pulmonary arteries to the segmental level. No evidence of pulmonary embolism. Normal heart size. No pericardial effusion. Mediastinum/Nodes: Non-specific enlarged bilateral axillary lymph nodes, largest nodes in the right axilla measuring 2.8 cm. Thyroid gland, trachea, and esophagus demonstrate no significant findings. Lungs/Pleura: Bibasilar scarring or atelectasis. No pleural effusion or pneumothorax. Upper Abdomen: No acute abnormality. Musculoskeletal: No chest wall abnormality. No acute or significant osseous findings. Review of the MIP images confirms the above findings. IMPRESSION: 1.  Negative examination for pulmonary embolism. 2. Bibasilar scarring or atelectasis. 3. Non-specific enlarged bilateral axillary lymph nodes, largest nodes in the right axilla measuring 2.8 cm. Electronically Signed   By: AEddie CandleM.D.   On: 03/12/2018 15:55     Ct Abdomen Pelvis W Contrast  Result Date: 03/12/2018 CLINICAL DATA:  Fever of unknown origin EXAM: CT ABDOMEN AND PELVIS WITH CONTRAST TECHNIQUE: Multidetector CT imaging of the abdomen and pelvis was performed using the standard protocol following bolus administration of intravenous contrast. CONTRAST:  1044mISOVUE-370 IOPAMIDOL (ISOVUE-370) INJECTION 76% COMPARISON:  Right upper quadrant ultrasound, 03/09/2018. FINDINGS: Lower chest: Please see separately reported CT examination of the chest. Hepatobiliary: No focal liver abnormality is seen. Minimal sludge in the dependent gallbladder. No gallbladder wall thickening, or biliary dilatation. Pancreas: Unremarkable. No pancreatic ductal dilatation or surrounding inflammatory changes. Spleen: Normal in size without focal abnormality. Adrenals/Urinary Tract: Adrenal glands are unremarkable. Kidneys are normal, without renal calculi, focal lesion, or hydronephrosis. Bladder is unremarkable. Stomach/Bowel: Stomach is within normal limits. Appendix appears normal. No evidence of bowel wall thickening, distention, or inflammatory changes. Vascular/Lymphatic: No significant vascular findings are present. No enlarged abdominal or pelvic lymph nodes. Reproductive: No mass or other abnormality. Other: No abdominal wall hernia or abnormality. No abdominopelvic ascites. Musculoskeletal: No acute or significant osseous findings. IMPRESSION: 1. Minimal sludge in the dependent gallbladder, as seen on prior ultrasound examination. No acute findings. 2. No acute CT findings in the abdomen or pelvis to explain fever. Normal appendix. Electronically Signed   By: AlEddie Candle.D.   On: 03/12/2018 16:00   UsKoreabdomen Limited Ruq  Result Date: 03/09/2018 CLINICAL  DATA:  Elevated LFTs EXAM: ULTRASOUND ABDOMEN LIMITED RIGHT UPPER QUADRANT COMPARISON:  None. FINDINGS: Gallbladder: There is some sludge in the gallbladder with no stones, wall thickening, or pericholecystic fluid.  No Murphy's sign. Common bile duct: Diameter: 3.2 mm Liver: No focal lesion identified. Within normal limits in parenchymal echogenicity. Portal vein is patent on color Doppler imaging with normal direction of blood flow towards the liver. IMPRESSION: Mild sludge in the gallbladder without wall thickening, pericholecystic fluid, or Murphy's sign. No other abnormalities. Electronically Signed   By: Dorise Bullion III M.D   On: 03/09/2018 10:51    Lab Data:  CBC: Recent Labs  Lab 03/09/18 6811 03/12/18 0844 03/13/18 0622 03/14/18 0557  WBC 8.6 10.9* 15.9* 17.8*  NEUTROABS 4.2 4.6 5.2  --   HGB 11.2* 11.4* 11.2* 9.7*  HCT 36.5 36.9 37.5 32.1*  MCV 82.0 85.0 87.2 84.5  PLT 239 245 269 572   Basic Metabolic Panel: Recent Labs  Lab 03/09/18 0843 03/12/18 0844 03/13/18 0622 03/14/18 0557  NA 133* 136 139 138  K 3.3* 2.9* 4.7 3.3*  CL 99 103 107 106  CO2 19* '23 22 22  '$ GLUCOSE 96 90 113* 93  BUN <5* <5* <5* 5*  CREATININE 0.84 0.60 0.47 0.74  CALCIUM 8.4* 8.5* 8.6* 8.4*  MG  --  2.3  --   --    GFR: Estimated Creatinine Clearance: 114.2 mL/min (by C-G formula based on SCr of 0.74 mg/dL). Liver Function Tests: Recent Labs  Lab 03/09/18 0843 03/12/18 0844 03/13/18 0622 03/14/18 0557  AST 80* 157* 192* 179*  ALT 116* 226* 285* 259*  ALKPHOS 89 150* 151* 190*  BILITOT 1.0 1.0 0.8 0.7  PROT 7.0 6.8 5.8* 5.8*  ALBUMIN 3.4* 3.4* 2.9* 3.0*   No results for input(s): LIPASE, AMYLASE in the last 168 hours. No results for input(s): AMMONIA in the last 168 hours. Coagulation Profile: No results for input(s): INR, PROTIME in the last 168 hours. Cardiac Enzymes: No results for input(s): CKTOTAL, CKMB, CKMBINDEX, TROPONINI in the last 168 hours. BNP (last 3 results) No results for input(s): PROBNP in the last 8760 hours. HbA1C: No results for input(s): HGBA1C in the last 72 hours. CBG: Recent Labs  Lab 03/12/18 0757  GLUCAP 86   Lipid Profile: No results for input(s):  CHOL, HDL, LDLCALC, TRIG, CHOLHDL, LDLDIRECT in the last 72 hours. Thyroid Function Tests: No results for input(s): TSH, T4TOTAL, FREET4, T3FREE, THYROIDAB in the last 72 hours. Anemia Panel: No results for input(s): VITAMINB12, FOLATE, FERRITIN, TIBC, IRON, RETICCTPCT in the last 72 hours. Urine analysis:    Component Value Date/Time   COLORURINE YELLOW 03/12/2018 1142   APPEARANCEUR HAZY (A) 03/12/2018 1142   LABSPEC 1.006 03/12/2018 1142   PHURINE 6.0 03/12/2018 1142   GLUCOSEU NEGATIVE 03/12/2018 1142   HGBUR NEGATIVE 03/12/2018 1142   BILIRUBINUR NEGATIVE 03/12/2018 1142   KETONESUR 20 (A) 03/12/2018 1142   PROTEINUR NEGATIVE 03/12/2018 1142   NITRITE NEGATIVE 03/12/2018 1142   LEUKOCYTESUR TRACE (A) 03/12/2018 1142     Mellie Buccellato M.D. [facility] 03/15/2018, 10:11 AM  Pager: [phone] Between 7am to 7pm - call Pager - [phone]  After 7pm go to [email or web address] - password [email or web address]  Call night coverage person covering after 7pm

## 2018-03-15 NOTE — Consult Note (Addendum)
Date of Admission:  03/12/2018          Reason for Consult: Fevers, axillary and cervical lymphadenopathy    Referring Provider: Dr. Tana Coast   Assessment:  1. Diffuse lymphadenopathy in neck and bilateral axilla with  2. High fevers 3. Hx of immune suppression for RA 4. Hx of travel to [place] in Sept through Oct 2019   Plan:  1. Follow-up Pathology read tomorrow (would recommend calling them for at least prelim read) 2. F/u AFB and fungal smears, cultures (done at [facility]) 3. Antipyretics 4. Anxiolytics 5. Consider cooling blanket 6. DC antibacterial antibiotics and ignore the sepsis prompts in this patient 7. DO NOT i initiate any empiric therapy for TB until we know that that is what this is.  I am concerned as Dr. Megan Salon was that the patient has had declaration of a lymphoma.  I have counseled the patient and her mother extensively and told him that we cannot initiate therapy for anything and we know what we are treating and so the only thing we can do for her fevers at present is supportive care.  I also similarly spoke to the nurse and conveyed this message.  Principal Problem:   FUO (fever of unknown origin) Active Problems:   Rheumatoid arthritis (HCC)   Lymphadenopathy, axillary   Normocytic anemia   Elevated liver enzymes   Scheduled Meds: . clonazePAM  0.25 mg Oral BID  . hydroxychloroquine  400 mg Oral Daily  . metoprolol tartrate  2.5 mg Intravenous Q6H  . predniSONE  5 mg Oral QAC breakfast  . sulfaSALAzine  500 mg Oral BID   Continuous Infusions: . sodium chloride 75 mL/hr at 03/14/18 2207   PRN Meds:.acetaminophen **OR** acetaminophen, diphenhydrAMINE, ibuprofen, oxyCODONE  HPI: Pennie Vanblarcom is a 38 y.o. female  [place] woman who was diagnosed with rheumatoid arthritis roughly 6 months ago.  Her symptoms are largely arthralgias, arthritic complaints and they responded to therapy with Plaquenil and sulfasalazinem prednisone. In September she traveled  to her native [place] and was there for 1 month's duration.  [place] urban area and had no exposures to farm animals unusual birds she had only exposure to a dog to the family owned.  There were no kittens or cat scratches.  She did not know of any exposure to someone with tuberculosis though she was obviously in a highly endemic area.  She tested negative for what it is worth through tuberculin skin test and then QuantiFERON gold here in house.   Several weeks ago she began to have fever, itching, and facial swelling due to significant cervical and posterior auricular lymphadenopathy.  She was seen in the emergency department 4 days ago and given amoxicillin for a presumed URI.  She returned on Thursday and was admitted.  She has had fever up to 103 degrees.  She has developed normocytic anemia and elevated liver enzymes over the last several months.  Exam and CT scan reveal lymphadenopathy.  Her serum LDH is quite elevated at 702.  She underwent excisional lymph node biopsy on Friday, March 13, 2018 by general surgery one specimen having been sent in formalin to the pathology department and another specimen having been sent to microbiology for AFB stain and culture and fungal stain and culture.  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  The leading items  on the differential diagnosis would include lymphoma, scrofula due to per tuberculosis or other Mycobacterium, fungal infection with a dimorphic fungus such as cryptococcus histoplasma or Blastomyces, potentially some type of vasculitis related to her rheumatoid arthritis though I am skeptical of this.  Her mother says that she herself previously had episodes of fevers that were related to her known rheumatoid arthritis.     Review of Systems: Review of Systems  Constitutional:  Positive for chills, diaphoresis, fever and malaise/fatigue.  HENT: Negative for congestion, ear discharge, ear pain, hearing loss, nosebleeds, sinus pain, sore throat and tinnitus.   Eyes: Negative.   Respiratory: Negative.  Negative for cough, hemoptysis, wheezing and stridor.   Cardiovascular: Positive for palpitations. Negative for chest pain, orthopnea, claudication and PND.  Gastrointestinal: Positive for nausea. Negative for abdominal pain, blood in stool, constipation, diarrhea, heartburn and melena.  Genitourinary: Negative.  Negative for dysuria.  Musculoskeletal: Positive for myalgias and neck pain.  Skin: Positive for itching and rash.  Neurological: Positive for headaches. Negative for dizziness and tingling.  Endo/Heme/Allergies: Negative.   Psychiatric/Behavioral: Positive for depression. Negative for hallucinations, memory loss, substance abuse and suicidal ideas. The patient is nervous/anxious and has insomnia.     History reviewed. No pertinent past medical history.  Social History   Tobacco Use  . Smoking status: Never Smoker  . Smokeless tobacco: Never Used  Substance Use Topics  . Alcohol use: No  . Drug use: No    No family history on file. No Known Allergies  OBJECTIVE: Blood pressure 95/78, pulse (!) 109, temperature 99.5 F (37.5 C), temperature source Oral, resp. rate 20, height 5\' 7"  (1.702 m), weight 93.7 kg, last menstrual period 02/26/2018, SpO2 95 %.  Physical Exam Vitals signs and nursing note reviewed.  Constitutional:      General: She is not in acute distress.    Appearance: Normal appearance. She is well-developed. She is not ill-appearing or diaphoretic.  HENT:     Head: Normocephalic and atraumatic.     Right Ear: Hearing and external ear normal.     Left Ear: Hearing and external ear normal.     Nose: Nose normal. No nasal deformity or rhinorrhea.     Mouth/Throat:     Mouth: Mucous membranes are moist.  Eyes:     General: No  scleral icterus.       Right eye: No discharge.        Left eye: No discharge.     Extraocular Movements: Extraocular movements intact.     Conjunctiva/sclera: Conjunctivae normal.     Right eye: Right conjunctiva is not injected.     Left eye: Left conjunctiva is not injected.     Pupils: Pupils are equal, round, and reactive to light.  Neck:     Musculoskeletal: Normal range of motion and neck supple.     Vascular: No JVD.  Cardiovascular:     Rate and Rhythm: Regular rhythm. Tachycardia present.     Heart sounds: Normal heart sounds, S1 normal and S2 normal. No murmur. No friction rub. No gallop.   Pulmonary:     Effort: Pulmonary effort is normal. No respiratory distress.     Breath sounds: Normal breath sounds. No stridor. No wheezing, rhonchi or rales.  Abdominal:     General: Bowel sounds are normal. There is no distension.     Palpations: Abdomen is soft.     Tenderness: There is no abdominal tenderness. There is no guarding.  Musculoskeletal: Normal  range of motion.     Right shoulder: Normal.     Left shoulder: Normal.     Right hip: Normal.     Left hip: Normal.     Right knee: Normal.     Left knee: Normal.  Lymphadenopathy:     Head:     Right side of head: Submental, submandibular, preauricular and posterior auricular adenopathy present.     Left side of head: Submental, submandibular, preauricular and posterior auricular adenopathy present.     Cervical: Cervical adenopathy present.     Right cervical: Superficial cervical adenopathy, deep cervical adenopathy and posterior cervical adenopathy present.     Left cervical: Superficial cervical adenopathy, deep cervical adenopathy and posterior cervical adenopathy present.     Upper Body:     Right upper body: Axillary adenopathy present.     Left upper body: Axillary adenopathy present.  Skin:    General: Skin is warm and dry.     Coloration: Skin is not pale.     Findings: No abrasion, bruising, ecchymosis,  erythema, lesion or rash.     Nails: There is no clubbing.   Neurological:     General: No focal deficit present.     Mental Status: She is alert and oriented to person, place, and time.     Sensory: No sensory deficit.     Motor: No weakness.     Coordination: Coordination normal.     Gait: Gait normal.  Psychiatric:        Attention and Perception: She is attentive.        Mood and Affect: Mood is anxious.        Speech: Speech normal.        Behavior: Behavior normal. Behavior is cooperative.        Thought Content: Thought content normal.        Judgment: Judgment normal.     Lab Results Lab Results  Component Value Date   WBC 37.3 (H) 03/15/2018   HGB 9.2 (L) 03/15/2018   HCT 29.1 (L) 03/15/2018   MCV 81.7 03/15/2018   PLT 292 03/15/2018    Lab Results  Component Value Date   CREATININE 0.77 03/15/2018   BUN 7 03/15/2018   NA 131 (L) 03/15/2018   K 3.4 (L) 03/15/2018   CL 104 03/15/2018   CO2 18 (L) 03/15/2018    Lab Results  Component Value Date   ALT 222 (H) 03/15/2018   AST 226 (H) 03/15/2018   ALKPHOS 220 (H) 03/15/2018   BILITOT 1.7 (H) 03/15/2018     Microbiology: Recent Results (from the past 240 hour(s))  Blood Culture (routine x 2)     Status: None   Collection Time: 03/09/18  8:42 AM  Result Value Ref Range Status   Specimen Description BLOOD RIGHT ANTECUBITAL  Final   Special Requests   Final    BOTTLES DRAWN AEROBIC AND ANAEROBIC Blood Culture adequate volume   Culture   Final    NO GROWTH 5 DAYS Performed at [facility], [address], [place], [place] [address]    Report Status 03/14/2018 FINAL  Final  Blood Culture (routine x 2)     Status: None   Collection Time: 03/09/18  8:55 AM  Result Value Ref Range Status   Specimen Description BLOOD RIGHT HAND  Final   Special Requests   Final    BOTTLES DRAWN AEROBIC AND ANAEROBIC Blood Culture adequate volume   Culture  Final    NO GROWTH 5 DAYS Performed at [facility], [place] [address] [place], [place] [address]    Report Status 03/14/2018 FINAL  Final  Group A Strep by PCR     Status: None   Collection Time: 03/09/18 12:10 PM  Result Value Ref Range Status   Group A Strep by PCR NOT DETECTED NOT DETECTED Final    Comment: Performed at [facility], [place] [address], [place], [place] [address]  Culture, blood (routine x 2)     Status: None (Preliminary result)   Collection Time: 03/12/18  9:06 AM  Result Value Ref Range Status   Specimen Description   Final    BLOOD LEFT ANTECUBITAL Performed at [facility] [address] [place], [place] [address]    Special Requests   Final    BOTTLES DRAWN AEROBIC AND ANAEROBIC Blood Culture results may not be optimal due to an excessive volume of blood received in culture bottles Performed at [facility] [address] [place], [place] [address]    Culture   Final    NO GROWTH 3 DAYS Performed at [facility], [place] [address], [place], [place] [address]    Report Status PENDING  Incomplete  Culture, blood (routine x 2)     Status: None (Preliminary result)   Collection Time: 03/12/18  9:45 AM  Result Value Ref Range Status   Specimen Description   Final    BLOOD RIGHT ANTECUBITAL Performed at [place] [address], [place], [place] [address]    Special Requests   Final    BOTTLES DRAWN AEROBIC AND ANAEROBIC Blood Culture adequate volume Performed at [facility] [address], [place], [place] [address]    Culture   Final    NO GROWTH 3 DAYS Performed at [facility], [place] [address] [place], [place] [address]    Report Status PENDING  Incomplete  Culture, Urine     Status: None   Collection Time: 03/12/18 11:42 AM  Result Value Ref Range Status   Specimen Description   Final    URINE, CLEAN CATCH Performed at [facility], [place] [address] [place], [place] [address]     Special Requests   Final    NONE Performed at [facility], [place] [address] [place],  [address]    Culture   Final    NO GROWTH Performed at [facility], [place] [address], [place],  [address]    Report Status 03/13/2018 FINAL  Final    Alcide Evener, [place] for Infectious Disease [facility] [phone] pager  03/15/2018, 2:11 PM

## 2018-03-16 ENCOUNTER — Encounter (HOSPITAL_COMMUNITY): Payer: Self-pay | Admitting: Surgery

## 2018-03-16 DIAGNOSIS — R748 Abnormal levels of other serum enzymes: Secondary | ICD-10-CM | POA: Diagnosis not present

## 2018-03-16 DIAGNOSIS — R509 Fever, unspecified: Secondary | ICD-10-CM | POA: Diagnosis not present

## 2018-03-16 DIAGNOSIS — R590 Localized enlarged lymph nodes: Secondary | ICD-10-CM | POA: Diagnosis not present

## 2018-03-16 DIAGNOSIS — E872 Acidosis: Secondary | ICD-10-CM | POA: Diagnosis not present

## 2018-03-16 DIAGNOSIS — D649 Anemia, unspecified: Secondary | ICD-10-CM | POA: Diagnosis not present

## 2018-03-16 DIAGNOSIS — K759 Inflammatory liver disease, unspecified: Secondary | ICD-10-CM

## 2018-03-16 LAB — CBC
HCT: 29.8 % — ABNORMAL LOW (ref 36.0–46.0)
Hemoglobin: 8.9 g/dL — ABNORMAL LOW (ref 12.0–15.0)
MCH: 25.2 pg — AB (ref 26.0–34.0)
MCHC: 29.9 g/dL — ABNORMAL LOW (ref 30.0–36.0)
MCV: 84.4 fL (ref 80.0–100.0)
Platelets: 193 10*3/uL (ref 150–400)
RBC: 3.53 MIL/uL — ABNORMAL LOW (ref 3.87–5.11)
RDW: 17.3 % — ABNORMAL HIGH (ref 11.5–15.5)
WBC: 11.8 10*3/uL — ABNORMAL HIGH (ref 4.0–10.5)
nRBC: 0.9 % — ABNORMAL HIGH (ref 0.0–0.2)

## 2018-03-16 LAB — COMPREHENSIVE METABOLIC PANEL
ALT: 218 U/L — ABNORMAL HIGH (ref 0–44)
AST: 179 U/L — AB (ref 15–41)
Albumin: 2.4 g/dL — ABNORMAL LOW (ref 3.5–5.0)
Alkaline Phosphatase: 226 U/L — ABNORMAL HIGH (ref 38–126)
Anion gap: 9 (ref 5–15)
BUN: 8 mg/dL (ref 6–20)
CO2: 20 mmol/L — ABNORMAL LOW (ref 22–32)
Calcium: 7.6 mg/dL — ABNORMAL LOW (ref 8.9–10.3)
Chloride: 108 mmol/L (ref 98–111)
Creatinine, Ser: 0.68 mg/dL (ref 0.44–1.00)
GFR calc Af Amer: >60 mL/min (ref >60)
GFR calc non Af Amer: >60 mL/min (ref >60)
Glucose, Bld: 75 mg/dL (ref 70–99)
POTASSIUM: 3.5 mmol/L (ref 3.5–5.1)
Sodium: 137 mmol/L (ref 135–145)
Total Bilirubin: 1.7 mg/dL — ABNORMAL HIGH (ref 0.3–1.2)
Total Protein: 5 g/dL — ABNORMAL LOW (ref 6.5–8.1)

## 2018-03-16 NOTE — Progress Notes (Signed)
Patient ID: Erin Clay, female   DOB: 09-05-1980, 38 y.o.   MRN: [id number]         [facility] Infectious Disease  Date of Admission:  03/12/2018      ASSESSMENT: The cause of her fevers, adenopathy, new normocytic anemia and hepatitis remains uncertain.  Her EBV serology reflects remote, inactive infection.  Serologic testing for CMV, hepatitis A, B, C and HIV are all negative.  HIV viral load is nondetectable.  I am still very concerned about the possibility of lymphoma.  PLAN: 1. Observe off of antibiotics pending lymph node stains and pathology review  Principal Problem:   FUO (fever of unknown origin) Active Problems:   Lymphadenopathy, axillary   Normocytic anemia   Elevated liver enzymes   Rheumatoid arthritis (HCC)   Scheduled Meds: . clonazePAM  0.25 mg Oral BID  . hydroxychloroquine  400 mg Oral Daily  . metoprolol tartrate  2.5 mg Intravenous Q6H  . predniSONE  5 mg Oral QAC breakfast  . sulfaSALAzine  500 mg Oral BID   Continuous Infusions: PRN Meds:.acetaminophen **OR** acetaminophen, diphenhydrAMINE, ibuprofen, oxyCODONE   SUBJECTIVE: She is feeling a little bit better today and less anxious.  Review of Systems: Review of Systems  Constitutional: Positive for chills, fever and malaise/fatigue.  Psychiatric/Behavioral: The patient is nervous/anxious.     No Known Allergies  OBJECTIVE: Vitals:   03/16/18 0224 03/16/18 0506 03/16/18 0628 03/16/18 1031  BP: 118/62 98/68 111/72   Pulse: (!) 144 (!) 113 (!) 115 (!) 112  Resp: (!) 25 (!) 22  (!) 22  Temp: (!) 100.4 F (38 C) 98.8 F (37.1 C)  98.8 F (37.1 C)  TempSrc: Oral Oral  Oral  SpO2: 98% 100%    Weight:  94.9 kg    Height:       Body mass index is 32.77 kg/m.  Physical Exam Constitutional:      Comments: She has been up walking in the hallway.  Currently, she is resting quietly in bed.  She is in good spirits.  Cardiovascular:     Rate and Rhythm: Normal rate and regular  rhythm.     Heart sounds: No murmur.  Pulmonary:     Effort: Pulmonary effort is normal.     Breath sounds: Normal breath sounds.  Lymphadenopathy:     Cervical: Cervical adenopathy present.     Upper Body:     Right upper body: Axillary adenopathy present.     Left upper body: Axillary adenopathy present.     Lab Results Lab Results  Component Value Date   WBC 11.8 (H) 03/16/2018   HGB 8.9 (L) 03/16/2018   HCT 29.8 (L) 03/16/2018   MCV 84.4 03/16/2018   PLT 193 03/16/2018    Lab Results  Component Value Date   CREATININE 0.68 03/16/2018   BUN 8 03/16/2018   NA 137 03/16/2018   K 3.5 03/16/2018   CL 108 03/16/2018   CO2 20 (L) 03/16/2018    Lab Results  Component Value Date   ALT 218 (H) 03/16/2018   AST 179 (H) 03/16/2018   ALKPHOS 226 (H) 03/16/2018   BILITOT 1.7 (H) 03/16/2018     Microbiology: Recent Results (from the past 240 hour(s))  Blood Culture (routine x 2)     Status: None   Collection Time: 03/09/18  8:42 AM  Result Value Ref Range Status   Specimen Description BLOOD RIGHT ANTECUBITAL  Final   Special Requests   Final  BOTTLES DRAWN AEROBIC AND ANAEROBIC Blood Culture adequate volume   Culture   Final    NO GROWTH 5 DAYS Performed at [facility], [place] [address], [place], [place] [address]    Report Status 03/14/2018 FINAL  Final  Blood Culture (routine x 2)     Status: None   Collection Time: 03/09/18  8:55 AM  Result Value Ref Range Status   Specimen Description BLOOD RIGHT HAND  Final   Special Requests   Final    BOTTLES DRAWN AEROBIC AND ANAEROBIC Blood Culture adequate volume   Culture   Final    NO GROWTH 5 DAYS Performed at [facility], [place] [address] [place], [place] [address]    Report Status 03/14/2018 FINAL  Final  Group A Strep by PCR     Status: None   Collection Time: 03/09/18 12:10 PM  Result Value Ref Range Status   Group A Strep by PCR NOT DETECTED NOT DETECTED Final    Comment: Performed at [facility], [place] [address] [place], [place] [address]  Culture, blood (routine x 2)     Status: None (Preliminary result)   Collection Time: 03/12/18  9:06 AM  Result Value Ref Range Status   Specimen Description   Final    BLOOD LEFT ANTECUBITAL Performed at [facility] [address], [place], [place] [address]    Special Requests   Final    BOTTLES DRAWN AEROBIC AND ANAEROBIC Blood Culture results may not be optimal due to an excessive volume of blood received in culture bottles Performed at [facility] [address] [place], [place] [address]    Culture   Final    NO GROWTH 4 DAYS Performed at [facility], [place] [address], [place], [place] [address]    Report Status PENDING  Incomplete  Culture, blood (routine x 2)     Status: None (Preliminary result)   Collection Time: 03/12/18  9:45 AM  Result Value Ref Range Status   Specimen Description   Final    BLOOD RIGHT ANTECUBITAL Performed at [facility] [address], [place], [place] [address]    Special Requests   Final    BOTTLES DRAWN AEROBIC AND ANAEROBIC Blood Culture adequate volume Performed at [facility] [address] [place], [place] [address]    Culture   Final    NO GROWTH 4 DAYS Performed at [facility], [place] [address], [place], [place] [address]    Report Status PENDING  Incomplete  Culture, Urine     Status: None   Collection Time: 03/12/18 11:42 AM  Result Value Ref Range Status   Specimen Description   Final    URINE, CLEAN CATCH Performed at [facility], [place] [address], [place], [place] [address]    Special Requests   Final    NONE Performed at [facility], [place] [address], [place], [place] [address]    Culture   Final    NO GROWTH Performed at [facility], [place] [address] [place],  [address]    Report Status 03/13/2018 FINAL  Final    Michel Bickers, MD [facility] for Infectious [place] [phone] pager   [phone] cell 03/16/2018, 3:46 PM

## 2018-03-16 NOTE — Progress Notes (Signed)
[facility]                                                                              Patient Demographics  Erin Clay, is a 38 y.o. female, DOB - [birth year], [place] date - 03/12/2018   Admitting Physician A Melven Sartorius., MD  Outpatient Primary MD for the patient is Patient, No Pcp Per  Outpatient specialists:   LOS - 3  days   Medical records reviewed and are as summarized below:    Chief Complaint  Patient presents with  . Facial Swelling  . Dizziness  . Weakness  . Pruritis       Brief summary   Patient is a 38 year old female with history of rheumatoid arthritis on Plaquenil, sulfasalazine, prednisone, diagnosed in December 2019 presented to ED with fevers, shortness of breath, malaise and lymphadenopathy.  History was obtained from the patient who noted travel to [place] for a month and returned in October, 2019.  Subsequently she she had a joint pains, saw rheumatology and was diagnosed with rheumatoid arthritis.  In the last 2 weeks, patient started having fevers, high up to 103 F, also noticed swollen lymph nodes in her neck area, decreased appetite.  In the last 3 days patient started having significant shortness of breath, felt her face was swollen.  She felt shortness of breath worsening with exertion otherwise denied any rash or abdominal pain, no sick contacts.  Per patient, she did not travel to remote places encounter, stated in the city and ate in the restaurants (mostly fine dining).    Assessment & Plan    Principal Problem: Dyspnea with hypoxia (HCC) with fever of unknown origin - CT angiogram of the chest negative for PE -Complete work-up for FUO, negative flu on 2/10, blood cultures 2/10- so far, HIV negative, CMV IgG elevated but IgM negative.  EBV IgM negative.  TSH normal -2D echo showed EF of 60 to 65%, no regional wall motion abnormalities, no pericardial effusion -Even though she has rheumatoid arthritis,  low suspicion of Felty syndrome given no neutropenia or splenomegaly -Status post excisional biopsy of the axillary lymph node (rule out lymphoma) on 2/14, path pending.  AFB fungal cultures, lymphoma path sent.  Called pathology department, has not been read yet. -CRP 7.6, ESR normal, LDH 702 -ID following, recommended keep off antibiotics until etiology is clear.  Blood cultures negative. Active Problems:  Transaminitis -Unclear etiology, HIV, hepatitis panel negative, EBV negative -LFTs improving -Ultrasound abdomen showed mild sludge in the gallbladder without wall thickening, pericholecystic fluid or Murphy sign, CT abdomen and pelvis showed no acute findings  Hypotension with tachycardia -BP currently stable, continue low-dose Lopressor    Rheumatoid arthritis (HCC) -Continue Plaquenil, sulfasalazine, prednisone   Code Status: Full CODE STATUS DVT Prophylaxis:   SCD's Family Communication: Discussed in detail with the patient, all imaging results, lab results explained to the patient and mother at the bedside   Disposition Plan: Needs inpatient work-up for FUO, awaiting biopsy results  Time Spent in minutes 35 minutes  Procedures:  CT abdomen pelvis Right axillary lymph node biopsy 03/13/2018  Consultants:  ID General surgery  Antimicrobials:   Anti-infectives (From admission, onward)   Start     Dose/Rate Route Frequency Ordered Stop   03/15/18 0600  vancomycin (VANCOCIN) IVPB 1000 mg/200 mL premix  Status:  Discontinued     1,000 mg 200 mL/hr over 60 Minutes Intravenous Every 12 hours 03/14/18 1707 03/15/18 1154   03/14/18 2300  piperacillin-tazobactam (ZOSYN) IVPB 3.375 g  Status:  Discontinued     3.375 g 12.5 mL/hr over 240 Minutes Intravenous Every 8 hours 03/14/18 1702 03/15/18 1154   03/14/18 1800  vancomycin (VANCOCIN) 2,000 mg in sodium chloride 0.9 % 500 mL IVPB     2,000 mg 250 mL/hr over 120 Minutes Intravenous  Once 03/14/18 1658 03/14/18 2022    03/14/18 1715  piperacillin-tazobactam (ZOSYN) IVPB 3.375 g     3.375 g 100 mL/hr over 30 Minutes Intravenous STAT 03/14/18 1655 03/14/18 1758   03/13/18 1322  ceFAZolin (ANCEF) 2-4 GM/100ML-% IVPB    Note to Pharmacy:  Mardelle Matte   : cabinet override      03/13/18 1322 03/13/18 1551   03/13/18 1130  ceFAZolin (ANCEF) IVPB 2g/100 mL premix     2 g 200 mL/hr over 30 Minutes Intravenous On call to O.R. 03/13/18 1125 03/13/18 1556   03/12/18 2000  hydroxychloroquine (PLAQUENIL) tablet 400 mg     400 mg Oral Daily 03/12/18 1829           Medications  Scheduled Meds: . clonazePAM  0.25 mg Oral BID  . hydroxychloroquine  400 mg Oral Daily  . metoprolol tartrate  2.5 mg Intravenous Q6H  . predniSONE  5 mg Oral QAC breakfast  . sulfaSALAzine  500 mg Oral BID   Continuous Infusions:  PRN Meds:.acetaminophen **OR** acetaminophen, diphenhydrAMINE, ibuprofen, oxyCODONE      Subjective:   Erin Clay was seen and examined today.  Somewhat anxious, overnight temp of 100.4 F.  No significant chest pain or shortness of breath.   Patient denies dizziness,  abdominal pain, N/V/D/C, new weakness, numbess, tingling.   Objective:   Vitals:   03/16/18 0224 03/16/18 0506 03/16/18 0628 03/16/18 1031  BP: 118/62 98/68 111/72   Pulse: (!) 144 (!) 113 (!) 115 (!) 112  Resp: (!) 25 (!) 22  (!) 22  Temp: (!) 100.4 F (38 C) 98.8 F (37.1 C)  98.8 F (37.1 C)  TempSrc: Oral Oral  Oral  SpO2: 98% 100%    Weight:  94.9 kg    Height:        Intake/Output Summary (Last 24 hours) at 03/16/2018 1116 Last data filed at 03/15/2018 1700 Gross per 24 hour  Intake 240 ml  Output -  Net 240 ml     Wt Readings from Last 3 Encounters:  03/16/18 94.9 kg  03/09/18 81.2 kg  09/25/17 83.5 kg   Physical Exam  General: Alert and oriented x 3, NAD  Eyes:   HEENT:  Atraumatic, normocephalic  Cardiovascular: S1 S2 clear, RRR. No pedal edema b/l  Respiratory: CTAB, no wheezing, rales  or rhonchi  Gastrointestinal: Soft, nontender, nondistended, NBS  Ext: no pedal edema bilaterally  Neuro: no new deficits  Musculoskeletal: No cyanosis, clubbing  Skin: No rashes  Psych: anxious  Data Reviewed:  I have personally reviewed following labs and imaging studies  Micro Results Recent Results (from the past 240 hour(s))  Blood Culture (routine x 2)     Status: None   Collection Time: 03/09/18  8:42 AM  Result Value  Ref Range Status   Specimen Description BLOOD RIGHT ANTECUBITAL  Final   Special Requests   Final    BOTTLES DRAWN AEROBIC AND ANAEROBIC Blood Culture adequate volume   Culture   Final    NO GROWTH 5 DAYS Performed at [facility], [address], [place], [place] [address]    Report Status 03/14/2018 FINAL  Final  Blood Culture (routine x 2)     Status: None   Collection Time: 03/09/18  8:55 AM  Result Value Ref Range Status   Specimen Description BLOOD RIGHT HAND  Final   Special Requests   Final    BOTTLES DRAWN AEROBIC AND ANAEROBIC Blood Culture adequate volume   Culture   Final    NO GROWTH 5 DAYS Performed at [facility], [place] [address], [place], [place] [address]    Report Status 03/14/2018 FINAL  Final  Group A Strep by PCR     Status: None   Collection Time: 03/09/18 12:10 PM  Result Value Ref Range Status   Group A Strep by PCR NOT DETECTED NOT DETECTED Final    Comment: Performed at [facility], [place] [address], [place], [place] [address]  Culture, blood (routine x 2)     Status: None (Preliminary result)   Collection Time: 03/12/18  9:06 AM  Result Value Ref Range Status   Specimen Description   Final    BLOOD LEFT ANTECUBITAL Performed at [place] [address], [place], [place] [address]    Special Requests   Final    BOTTLES DRAWN AEROBIC AND ANAEROBIC Blood Culture results may not be optimal due to an excessive volume of blood received in culture bottles Performed at [facility] [address] [place], [place] [address]    Culture   Final    NO GROWTH 3 DAYS Performed at [facility], [place] [address] [place], [place] [address]    Report Status PENDING  Incomplete  Culture, blood (routine x 2)     Status: None (Preliminary result)   Collection Time: 03/12/18  9:45 AM  Result Value Ref Range Status   Specimen Description   Final    BLOOD RIGHT ANTECUBITAL Performed at [facility] [address] [place], [place] [address]    Special Requests   Final    BOTTLES DRAWN AEROBIC AND ANAEROBIC Blood Culture adequate volume Performed at [facility] [address] [place], [place] [address]    Culture   Final    NO GROWTH 3 DAYS Performed at [facility], [place] [address] [place], [place] [address]    Report Status PENDING  Incomplete  Culture, Urine     Status: None   Collection Time: 03/12/18 11:42 AM  Result Value Ref Range Status   Specimen Description   Final    URINE, CLEAN CATCH Performed at [facility], [place] [address], [place], [place] [address]    Special Requests   Final    NONE Performed at [facility], [place] [address] [place], [place] [address]    Culture   Final    NO GROWTH Performed at [facility], [place] [address], [place],  [address]    Report Status 03/13/2018 FINAL  Final    Radiology Reports Dg Chest 2 View  Result Date: 03/12/2018 CLINICAL DATA:  Dyspnea and fever EXAM: CHEST - 2 VIEW COMPARISON:  Three days ago FINDINGS: Mildly low lung volumes with interstitial crowding. There is no edema, consolidation, effusion, or pneumothorax. Normal heart size and mediastinal contours. IMPRESSION: Low volume chest with mild atelectasis. Electronically Signed   By: Monte Fantasia M.D.   On: 03/12/2018 10:37   Dg Chest 2 View  Result Date: 03/09/2018 CLINICAL DATA:  Fever EXAM: CHEST - 2 VIEW COMPARISON:  None.  FINDINGS: Lungs are clear. Heart size and pulmonary vascularity are normal. No adenopathy. No bone lesions. IMPRESSION: No edema or consolidation. Electronically Signed   By: Lowella Grip III M.D.   On: 03/09/2018 14:02   Ct Angio Chest Pe W And/or Wo Contrast  Result Date: 03/12/2018 CLINICAL DATA:  PE suspected EXAM: CT ANGIOGRAPHY CHEST WITH CONTRAST TECHNIQUE: Multidetector CT imaging of the chest was performed using the standard protocol during bolus administration of intravenous contrast. Multiplanar CT image reconstructions and MIPs were obtained to evaluate the vascular anatomy. CONTRAST:  122m ISOVUE-370 IOPAMIDOL (ISOVUE-370) INJECTION 76% COMPARISON:  Same day chest radiograph FINDINGS: Cardiovascular: Satisfactory opacification of the pulmonary arteries to the segmental level. No evidence of pulmonary embolism. Normal heart size. No pericardial effusion. Mediastinum/Nodes: Non-specific enlarged bilateral axillary lymph nodes, largest nodes in the right axilla measuring 2.8 cm. Thyroid gland, trachea, and esophagus demonstrate no significant findings. Lungs/Pleura: Bibasilar scarring or atelectasis. No pleural effusion or pneumothorax. Upper Abdomen: No acute abnormality. Musculoskeletal: No chest wall abnormality. No acute or significant osseous findings. Review of the MIP images confirms the above findings. IMPRESSION: 1.  Negative examination for pulmonary embolism. 2. Bibasilar scarring or atelectasis. 3. Non-specific enlarged bilateral axillary lymph nodes, largest nodes in the right axilla measuring 2.8 cm. Electronically Signed   By: AEddie CandleM.D.   On: 03/12/2018 15:55   Ct Abdomen Pelvis W Contrast  Result Date: 03/12/2018 CLINICAL DATA:  Fever of unknown origin EXAM: CT ABDOMEN AND PELVIS WITH CONTRAST TECHNIQUE: Multidetector CT imaging of the abdomen and pelvis was performed using the standard protocol following bolus administration of intravenous contrast. CONTRAST:  1064m ISOVUE-370 IOPAMIDOL (ISOVUE-370) INJECTION 76% COMPARISON:  Right upper quadrant ultrasound, 03/09/2018. FINDINGS: Lower chest: Please see separately reported CT examination of the chest. Hepatobiliary: No focal liver abnormality is seen. Minimal sludge in the dependent gallbladder. No gallbladder wall thickening, or biliary dilatation. Pancreas: Unremarkable. No pancreatic ductal dilatation or surrounding inflammatory changes. Spleen: Normal in size without focal abnormality. Adrenals/Urinary Tract: Adrenal glands are unremarkable. Kidneys are normal, without renal calculi, focal lesion, or hydronephrosis. Bladder is unremarkable. Stomach/Bowel: Stomach is within normal limits. Appendix appears normal. No evidence of bowel wall thickening, distention, or inflammatory changes. Vascular/Lymphatic: No significant vascular findings are present. No enlarged abdominal or pelvic lymph nodes. Reproductive: No mass or other abnormality. Other: No abdominal wall hernia or abnormality. No abdominopelvic ascites. Musculoskeletal: No acute or significant osseous findings. IMPRESSION: 1. Minimal sludge in the dependent gallbladder, as seen on prior ultrasound examination. No acute findings. 2. No acute CT findings in the abdomen or pelvis to explain fever. Normal appendix. Electronically Signed   By: AlEddie Candle.D.   On: 03/12/2018 16:00   UsKoreabdomen Limited Ruq  Result Date: 03/09/2018 CLINICAL DATA:  Elevated LFTs EXAM: ULTRASOUND ABDOMEN LIMITED RIGHT UPPER QUADRANT COMPARISON:  None. FINDINGS: Gallbladder: There is some sludge in the gallbladder with no stones, wall thickening, or pericholecystic fluid. No Murphy's sign. Common bile duct: Diameter: 3.2 mm Liver: No focal lesion identified. Within normal limits in parenchymal echogenicity. Portal vein is patent  on color Doppler imaging with normal direction of blood flow towards the liver. IMPRESSION: Mild sludge in the gallbladder without wall thickening,  pericholecystic fluid, or Murphy's sign. No other abnormalities. Electronically Signed   By: Dorise Bullion III M.D   On: 03/09/2018 10:51    Lab Data:  CBC: Recent Labs  Lab 03/12/18 0844 03/13/18 0622 03/14/18 0557 03/15/18 0600 03/16/18 0539  WBC 10.9* 15.9* 17.8* 37.3* 11.8*  NEUTROABS 4.6 5.2  --   --   --   HGB 11.4* 11.2* 9.7* 9.2* 8.9*  HCT 36.9 37.5 32.1* 29.1* 29.8*  MCV 85.0 87.2 84.5 81.7 84.4  PLT 245 269 298 292 761   Basic Metabolic Panel: Recent Labs  Lab 03/12/18 0844 03/13/18 0622 03/14/18 0557 03/15/18 0839 03/16/18 0539  NA 136 139 138 131* 137  K 2.9* 4.7 3.3* 3.4* 3.5  CL 103 107 106 104 108  CO2 _0 18* 20*  GLUCOSE 90 113* 93 93 75  BUN <5* <5* 5* 7 8  CREATININE 0.60 0.47 0.74 0.77 0.68  CALCIUM 8.5* 8.6* 8.4* 7.5* 7.6*  MG 2.3  --   --   --   --    GFR: Estimated Creatinine Clearance: 115 mL/min (by C-G formula based on SCr of 0.68 mg/dL). Liver Function Tests: Recent Labs  Lab 03/12/18 0844 03/13/18 0622 03/14/18 0557 03/15/18 0839 03/16/18 0539  AST 157* 192* 179* 226* 179*  ALT 226* 285* 259* 222* 218*  ALKPHOS 150* 151* 190* 220* 226*  BILITOT 1.0 0.8 0.7 1.7* 1.7*  PROT 6.8 5.8* 5.8* 5.0* 5.0*  ALBUMIN 3.4* 2.9* 3.0* 2.6* 2.4*   No results for input(s): LIPASE, AMYLASE in the last 168 hours. No results for input(s): AMMONIA in the last 168 hours. Coagulation Profile: No results for input(s): INR, PROTIME in the last 168 hours. Cardiac Enzymes: No results for input(s): CKTOTAL, CKMB, CKMBINDEX, TROPONINI in the last 168 hours. BNP (last 3 results) No results for input(s): PROBNP in the last 8760 hours. HbA1C: No results for input(s): HGBA1C in the last 72 hours. CBG: Recent Labs  Lab 03/12/18 0757  GLUCAP 86   Lipid Profile: No results for input(s): CHOL, HDL, LDLCALC, TRIG, CHOLHDL, LDLDIRECT in the last 72 hours. Thyroid Function Tests: No results for input(s): TSH, T4TOTAL, FREET4, T3FREE, THYROIDAB in  the last 72 hours. Anemia Panel: No results for input(s): VITAMINB12, FOLATE, FERRITIN, TIBC, IRON, RETICCTPCT in the last 72 hours. Urine analysis:    Component Value Date/Time   COLORURINE YELLOW 03/12/2018 1142   APPEARANCEUR HAZY (A) 03/12/2018 1142   LABSPEC 1.006 03/12/2018 1142   PHURINE 6.0 03/12/2018 1142   GLUCOSEU NEGATIVE 03/12/2018 1142   HGBUR NEGATIVE 03/12/2018 1142   BILIRUBINUR NEGATIVE 03/12/2018 1142   KETONESUR 20 (A) 03/12/2018 1142   PROTEINUR NEGATIVE 03/12/2018 1142   NITRITE NEGATIVE 03/12/2018 1142   LEUKOCYTESUR TRACE (A) 03/12/2018 1142     Ripudeep Rai M.D. [facility] 03/16/2018, 11:16 AM  Pager: [phone] Between 7am to 7pm - call Pager - [phone]  After 7pm go to [email or web address] - password [email or web address]  Call night coverage person covering after 7pm

## 2018-03-17 DIAGNOSIS — R509 Fever, unspecified: Secondary | ICD-10-CM | POA: Diagnosis not present

## 2018-03-17 DIAGNOSIS — R748 Abnormal levels of other serum enzymes: Secondary | ICD-10-CM | POA: Diagnosis not present

## 2018-03-17 DIAGNOSIS — E872 Acidosis: Secondary | ICD-10-CM | POA: Diagnosis not present

## 2018-03-17 LAB — BASIC METABOLIC PANEL
Anion gap: 9 (ref 5–15)
BUN: 8 mg/dL (ref 6–20)
CO2: 22 mmol/L (ref 22–32)
CREATININE: 0.56 mg/dL (ref 0.44–1.00)
Calcium: 7.9 mg/dL — ABNORMAL LOW (ref 8.9–10.3)
Chloride: 106 mmol/L (ref 98–111)
GFR calc non Af Amer: >60 mL/min (ref >60)
Glucose, Bld: 77 mg/dL (ref 70–99)
Potassium: 3.5 mmol/L (ref 3.5–5.1)
Sodium: 137 mmol/L (ref 135–145)

## 2018-03-17 LAB — CBC
HEMATOCRIT: 32.9 % — AB (ref 36.0–46.0)
Hemoglobin: 10 g/dL — ABNORMAL LOW (ref 12.0–15.0)
MCH: 26 pg (ref 26.0–34.0)
MCHC: 30.4 g/dL (ref 30.0–36.0)
MCV: 85.5 fL (ref 80.0–100.0)
Platelets: 178 10*3/uL (ref 150–400)
RBC: 3.85 MIL/uL — ABNORMAL LOW (ref 3.87–5.11)
RDW: 17.3 % — ABNORMAL HIGH (ref 11.5–15.5)
WBC: 7.8 10*3/uL (ref 4.0–10.5)
nRBC: 1 % — ABNORMAL HIGH (ref 0.0–0.2)

## 2018-03-17 LAB — CULTURE, BLOOD (ROUTINE X 2)
Culture: NO GROWTH
Culture: NO GROWTH
SPECIAL REQUESTS: ADEQUATE

## 2018-03-17 LAB — GLUCOSE, CAPILLARY: Glucose-Capillary: 80 mg/dL (ref 70–99)

## 2018-03-17 LAB — ACID FAST SMEAR (AFB, MYCOBACTERIA): Acid Fast Smear: NEGATIVE

## 2018-03-17 LAB — CORTISOL: Cortisol, Plasma: 11.5 ug/dL

## 2018-03-17 NOTE — Progress Notes (Signed)
[facility]                                                                              Patient Demographics  Erin Clay, is a 38 y.o. female, DOB - [birth year], [place] date - 03/12/2018   Admitting Physician A Melven Sartorius., MD  Outpatient Primary MD for the patient is Patient, No Pcp Per  Outpatient specialists:   LOS - 4  days   Medical records reviewed and are as summarized below:    Chief Complaint  Patient presents with  . Facial Swelling  . Dizziness  . Weakness  . Pruritis       Brief summary   Patient is a 38 year old female with history of rheumatoid arthritis on Plaquenil, sulfasalazine, prednisone, diagnosed in December 2019 presented to ED with fevers, shortness of breath, malaise and lymphadenopathy.  History was obtained from the patient who noted travel to [place] for a month and returned in October, 2019.  Subsequently she she had a joint pains, saw rheumatology and was diagnosed with rheumatoid arthritis.  In the last 2 weeks, patient started having fevers, high up to 103 F, also noticed swollen lymph nodes in her neck area, decreased appetite.  In the last 3 days patient started having significant shortness of breath, felt her face was swollen.  She felt shortness of breath worsening with exertion otherwise denied any rash or abdominal pain, no sick contacts.  Per patient, she did not travel to remote places encounter, stated in the city and ate in the restaurants (mostly fine dining).    Assessment & Plan    Principal Problem: Dyspnea with hypoxia (HCC) with fever of unknown origin - CT angiogram of the chest negative for PE -Complete work-up for FUO, negative flu on 2/10, HIV negative, CMV IgG elevated but IgM negative.  EBV IgM negative.  TSH normal -2D echo showed EF of 60 to 65%, no regional wall motion abnormalities, no pericardial effusion -Even though she has rheumatoid arthritis, low suspicion of Felty  syndrome given no neutropenia or splenomegaly -Status post excisional biopsy of the axillary lymph node (rule out lymphoma) on 2/14, pathology report still pending.  AFB fungal cultures, lymphoma path sent.  -CRP 7.6, ESR normal, LDH 702 -ID following, recommended keep off antibiotics until etiology is clear.  Blood cultures negative.   Active Problems:  Transaminitis -Unclear etiology, HIV, hepatitis panel negative, EBV negative -LFTs improving -Ultrasound abdomen showed mild sludge in the gallbladder without wall thickening, pericholecystic fluid or Murphy sign, CT abdomen and pelvis showed no acute findings  Hypotension with tachycardia -BP currently stable, continue low-dose Lopressor -Random cortisol level normal.    Rheumatoid arthritis (HCC) -Continue Plaquenil, sulfasalazine, prednisone   Code Status: Full CODE STATUS DVT Prophylaxis:   SCD's Family Communication: Discussed in detail with the patient, all imaging results, lab results explained to the patient.  Discussed with mother yesterday on 2/17   Disposition Plan: Needs inpatient work-up for FUO, awaiting biopsy results  Time Spent in minutes 35 minutes  Procedures:  CT abdomen pelvis Right axillary lymph node biopsy 03/13/2018  Consultants:   ID General surgery  Antimicrobials:   Anti-infectives (From admission, onward)   Start     Dose/Rate Route Frequency Ordered Stop   03/15/18 0600  vancomycin (VANCOCIN) IVPB 1000 mg/200 mL premix  Status:  Discontinued     1,000 mg 200 mL/hr over 60 Minutes Intravenous Every 12 hours 03/14/18 1707 03/15/18 1154   03/14/18 2300  piperacillin-tazobactam (ZOSYN) IVPB 3.375 g  Status:  Discontinued     3.375 g 12.5 mL/hr over 240 Minutes Intravenous Every 8 hours 03/14/18 1702 03/15/18 1154   03/14/18 1800  vancomycin (VANCOCIN) 2,000 mg in sodium chloride 0.9 % 500 mL IVPB     2,000 mg 250 mL/hr over 120 Minutes Intravenous  Once 03/14/18 1658 03/14/18 2022   03/14/18  1715  piperacillin-tazobactam (ZOSYN) IVPB 3.375 g     3.375 g 100 mL/hr over 30 Minutes Intravenous STAT 03/14/18 1655 03/14/18 1758   03/13/18 1322  ceFAZolin (ANCEF) 2-4 GM/100ML-% IVPB    Note to Pharmacy:  Mardelle Matte   : cabinet override      03/13/18 1322 03/13/18 1551   03/13/18 1130  ceFAZolin (ANCEF) IVPB 2g/100 mL premix     2 g 200 mL/hr over 30 Minutes Intravenous On call to O.R. 03/13/18 1125 03/13/18 1556   03/12/18 2000  hydroxychloroquine (PLAQUENIL) tablet 400 mg     400 mg Oral Daily 03/12/18 1829           Medications  Scheduled Meds: . clonazePAM  0.25 mg Oral BID  . hydroxychloroquine  400 mg Oral Daily  . metoprolol tartrate  2.5 mg Intravenous Q6H  . predniSONE  5 mg Oral QAC breakfast  . sulfaSALAzine  500 mg Oral BID   Continuous Infusions:  PRN Meds:.acetaminophen **OR** acetaminophen, diphenhydrAMINE, ibuprofen, oxyCODONE      Subjective:   Sukhman Shorb was seen and examined today.  Anxious, afebrile overnight.  States woke up very dizzy but feeling better at the time of my examination.  No chest pain.  Denies any abdominal pain, N/V/D/C, new weakness, numbess, tingling.   Objective:   Vitals:   03/17/18 0438 03/17/18 1004 03/17/18 1010 03/17/18 1016  BP: 100/69 120/73 108/86 (!) 110/57  Pulse: (!) 101 (!) 110 (!) 115 (!) 115  Resp: 18     Temp: 98.1 F (36.7 C) 99.3 F (37.4 C) 97.8 F (36.6 C) 98.9 F (37.2 C)  TempSrc:  Oral Oral Oral  SpO2: 100% 99% 100% 100%  Weight: 88 kg     Height:        Intake/Output Summary (Last 24 hours) at 03/17/2018 1420 Last data filed at 03/17/2018 1030 Gross per 24 hour  Intake 470 ml  Output -  Net 470 ml     Wt Readings from Last 3 Encounters:  03/17/18 88 kg  03/09/18 81.2 kg  09/25/17 83.5 kg   Physical Exam  General: Alert and oriented x 3, NAD anxious  Eyes:   HEENT:  Atraumatic, normocephalic  Cardiovascular: S1 S2 clear, RRR. No pedal edema b/l  Respiratory:  CTAB, no wheezing, rales or rhonchi  Gastrointestinal: Soft, nontender, nondistended, NBS  Ext: no pedal edema bilaterally  Neuro: no new deficits  Musculoskeletal: No cyanosis, clubbing  Skin: No rashes  Psych: anxious today   Data Reviewed:  I have personally reviewed following labs and imaging studies  Micro Results Recent Results (from the past 240 hour(s))  Blood Culture (routine x 2)     Status: None   Collection Time: 03/09/18  8:42 AM  Result Value Ref Range Status   Specimen Description BLOOD RIGHT ANTECUBITAL  Final   Special Requests   Final    BOTTLES DRAWN AEROBIC AND ANAEROBIC Blood Culture adequate volume   Culture   Final    NO GROWTH 5 DAYS Performed at [facility], [address] [place], [place] [address]    Report Status 03/14/2018 FINAL  Final  Blood Culture (routine x 2)     Status: None   Collection Time: 03/09/18  8:55 AM  Result Value Ref Range Status   Specimen Description BLOOD RIGHT HAND  Final   Special Requests   Final    BOTTLES DRAWN AEROBIC AND ANAEROBIC Blood Culture adequate volume   Culture   Final    NO GROWTH 5 DAYS Performed at [facility], [place] [address], [place], [place] [address]    Report Status 03/14/2018 FINAL  Final  Group A Strep by PCR     Status: None   Collection Time: 03/09/18 12:10 PM  Result Value Ref Range Status   Group A Strep by PCR NOT DETECTED NOT DETECTED Final    Comment: Performed at [facility], [place] [address], [place], [place] [address]  Culture, blood (routine x 2)     Status: None   Collection Time: 03/12/18  9:06 AM  Result Value Ref Range Status   Specimen Description   Final    BLOOD LEFT ANTECUBITAL Performed at [facility] [address] [place], [place] [address]    Special Requests   Final    BOTTLES DRAWN AEROBIC AND ANAEROBIC Blood Culture results may not be optimal due to an excessive volume of blood received in culture bottles Performed at  [facility] [address], [place], [place] [address]    Culture   Final    NO GROWTH 5 DAYS Performed at [facility], [place] [address] [place], [place] [address]    Report Status 03/17/2018 FINAL  Final  Culture, blood (routine x 2)     Status: None   Collection Time: 03/12/18  9:45 AM  Result Value Ref Range Status   Specimen Description   Final    BLOOD RIGHT ANTECUBITAL Performed at [facility] [address], [place], [place] [address]    Special Requests   Final    BOTTLES DRAWN AEROBIC AND ANAEROBIC Blood Culture adequate volume Performed at [facility] [address], [place], [place] [address]    Culture   Final    NO GROWTH 5 DAYS Performed at [facility], [place] [address] [place], [place] [address]    Report Status 03/17/2018 FINAL  Final  Culture, Urine     Status: None   Collection Time: 03/12/18 11:42 AM  Result Value Ref Range Status   Specimen Description   Final    URINE, CLEAN CATCH Performed at [facility], [place] [address] [place], [place] [address]    Special Requests   Final    NONE Performed at [facility], [place] [address] [place], [place] [address]    Culture   Final    NO GROWTH Performed at [facility], [place] [address], [place],  [address]    Report Status 03/13/2018 FINAL  Final    Radiology Reports Dg Chest 2 View  Result Date: 03/12/2018 CLINICAL DATA:  Dyspnea and fever EXAM: CHEST - 2 VIEW COMPARISON:  Three days ago FINDINGS: Mildly low lung volumes with interstitial crowding. There is no edema, consolidation, effusion, or pneumothorax. Normal heart size and mediastinal contours. IMPRESSION: Low volume chest with mild atelectasis. Electronically Signed   By: Monte Fantasia M.D.   On: 03/12/2018 10:37   Dg Chest 2 View  Result Date: 03/09/2018 CLINICAL DATA:  Fever EXAM: CHEST - 2 VIEW COMPARISON:  None.  FINDINGS: Lungs are clear. Heart size and pulmonary vascularity are normal. No adenopathy. No bone lesions. IMPRESSION: No edema or consolidation. Electronically Signed   By: Lowella Grip III M.D.   On: 03/09/2018 14:02   Ct Angio Chest Pe W And/or Wo Contrast  Result Date: 03/12/2018 CLINICAL DATA:  PE suspected EXAM: CT ANGIOGRAPHY CHEST WITH CONTRAST TECHNIQUE: Multidetector CT imaging of the chest was performed using the standard protocol during bolus administration of intravenous contrast. Multiplanar CT image reconstructions and MIPs were obtained to evaluate the vascular anatomy. CONTRAST:  122m ISOVUE-370 IOPAMIDOL (ISOVUE-370) INJECTION 76% COMPARISON:  Same day chest radiograph FINDINGS: Cardiovascular: Satisfactory opacification of the pulmonary arteries to the segmental level. No evidence of pulmonary embolism. Normal heart size. No pericardial effusion. Mediastinum/Nodes: Non-specific enlarged bilateral axillary lymph nodes, largest nodes in the right axilla measuring 2.8 cm. Thyroid gland, trachea, and esophagus demonstrate no significant findings. Lungs/Pleura: Bibasilar scarring or atelectasis. No pleural effusion or pneumothorax. Upper Abdomen: No acute abnormality. Musculoskeletal: No chest wall abnormality. No acute or significant osseous findings. Review of the MIP images confirms the above findings. IMPRESSION: 1.  Negative examination for pulmonary embolism. 2. Bibasilar scarring or atelectasis. 3. Non-specific enlarged bilateral axillary lymph nodes, largest nodes in the right axilla measuring 2.8 cm. Electronically Signed   By: AEddie CandleM.D.   On: 03/12/2018 15:55   Ct Abdomen Pelvis W Contrast  Result Date: 03/12/2018 CLINICAL DATA:  Fever of unknown origin EXAM: CT ABDOMEN AND PELVIS WITH CONTRAST TECHNIQUE: Multidetector CT imaging of the abdomen and pelvis was performed using the standard protocol following bolus administration of intravenous contrast. CONTRAST:  1064m ISOVUE-370 IOPAMIDOL (ISOVUE-370) INJECTION 76% COMPARISON:  Right upper quadrant ultrasound, 03/09/2018. FINDINGS: Lower chest: Please see separately reported CT examination of the chest. Hepatobiliary: No focal liver abnormality is seen. Minimal sludge in the dependent gallbladder. No gallbladder wall thickening, or biliary dilatation. Pancreas: Unremarkable. No pancreatic ductal dilatation or surrounding inflammatory changes. Spleen: Normal in size without focal abnormality. Adrenals/Urinary Tract: Adrenal glands are unremarkable. Kidneys are normal, without renal calculi, focal lesion, or hydronephrosis. Bladder is unremarkable. Stomach/Bowel: Stomach is within normal limits. Appendix appears normal. No evidence of bowel wall thickening, distention, or inflammatory changes. Vascular/Lymphatic: No significant vascular findings are present. No enlarged abdominal or pelvic lymph nodes. Reproductive: No mass or other abnormality. Other: No abdominal wall hernia or abnormality. No abdominopelvic ascites. Musculoskeletal: No acute or significant osseous findings. IMPRESSION: 1. Minimal sludge in the dependent gallbladder, as seen on prior ultrasound examination. No acute findings. 2. No acute CT findings in the abdomen or pelvis to explain fever. Normal appendix. Electronically Signed   By: AlEddie Candle.D.   On: 03/12/2018 16:00   UsKoreabdomen Limited Ruq  Result Date: 03/09/2018 CLINICAL DATA:  Elevated LFTs EXAM: ULTRASOUND ABDOMEN LIMITED RIGHT UPPER QUADRANT COMPARISON:  None. FINDINGS: Gallbladder: There is some sludge in the gallbladder with no stones, wall thickening, or pericholecystic fluid. No Murphy's sign. Common bile duct: Diameter: 3.2 mm Liver: No focal lesion identified. Within normal limits in parenchymal echogenicity. Portal vein is patent  on color Doppler imaging with normal direction of blood flow towards the liver. IMPRESSION: Mild sludge in the gallbladder without wall thickening,  pericholecystic fluid, or Murphy's sign. No other abnormalities. Electronically Signed   By: Dorise Bullion III M.D   On: 03/09/2018 10:51    Lab Data:  CBC: Recent Labs  Lab 03/12/18 8453 03/13/18 0622 03/14/18 0557 03/15/18 0600 03/16/18 0539 03/17/18 0617  WBC 10.9* 15.9* 17.8* 37.3* 11.8* 7.8  NEUTROABS 4.6 5.2  --   --   --   --   HGB 11.4* 11.2* 9.7* 9.2* 8.9* 10.0*  HCT 36.9 37.5 32.1* 29.1* 29.8* 32.9*  MCV 85.0 87.2 84.5 81.7 84.4 85.5  PLT 245 269 298 292 193 646   Basic Metabolic Panel: Recent Labs  Lab 03/12/18 0844 03/13/18 0622 03/14/18 0557 03/15/18 0839 03/16/18 0539 03/17/18 0617  NA 136 139 138 131* 137 137  K 2.9* 4.7 3.3* 3.4* 3.5 3.5  CL 103 107 106 104 108 106  CO2 '23 22 22 '$ 18* 20* 22  GLUCOSE 90 113* 93 93 75 77  BUN <5* <5* 5* '7 8 8  '$ CREATININE 0.60 0.47 0.74 0.77 0.68 0.56  CALCIUM 8.5* 8.6* 8.4* 7.5* 7.6* 7.9*  MG 2.3  --   --   --   --   --    GFR: Estimated Creatinine Clearance: 110.8 mL/min (by C-G formula based on SCr of 0.56 mg/dL). Liver Function Tests: Recent Labs  Lab 03/12/18 0844 03/13/18 0622 03/14/18 0557 03/15/18 0839 03/16/18 0539  AST 157* 192* 179* 226* 179*  ALT 226* 285* 259* 222* 218*  ALKPHOS 150* 151* 190* 220* 226*  BILITOT 1.0 0.8 0.7 1.7* 1.7*  PROT 6.8 5.8* 5.8* 5.0* 5.0*  ALBUMIN 3.4* 2.9* 3.0* 2.6* 2.4*   No results for input(s): LIPASE, AMYLASE in the last 168 hours. No results for input(s): AMMONIA in the last 168 hours. Coagulation Profile: No results for input(s): INR, PROTIME in the last 168 hours. Cardiac Enzymes: No results for input(s): CKTOTAL, CKMB, CKMBINDEX, TROPONINI in the last 168 hours. BNP (last 3 results) No results for input(s): PROBNP in the last 8760 hours. HbA1C: No results for input(s): HGBA1C in the last 72 hours. CBG: Recent Labs  Lab 03/12/18 0757 03/17/18 0118  GLUCAP 86 80   Lipid Profile: No results for input(s): CHOL, HDL, LDLCALC, TRIG, CHOLHDL, LDLDIRECT in  the last 72 hours. Thyroid Function Tests: No results for input(s): TSH, T4TOTAL, FREET4, T3FREE, THYROIDAB in the last 72 hours. Anemia Panel: No results for input(s): VITAMINB12, FOLATE, FERRITIN, TIBC, IRON, RETICCTPCT in the last 72 hours. Urine analysis:    Component Value Date/Time   COLORURINE YELLOW 03/12/2018 1142   APPEARANCEUR HAZY (A) 03/12/2018 1142   LABSPEC 1.006 03/12/2018 1142   PHURINE 6.0 03/12/2018 1142   GLUCOSEU NEGATIVE 03/12/2018 1142   HGBUR NEGATIVE 03/12/2018 1142   BILIRUBINUR NEGATIVE 03/12/2018 1142   KETONESUR 20 (A) 03/12/2018 1142   PROTEINUR NEGATIVE 03/12/2018 1142   NITRITE NEGATIVE 03/12/2018 1142   LEUKOCYTESUR TRACE (A) 03/12/2018 1142     Irvin Lizama M.D. [facility] 03/17/2018, 2:20 PM  Pager: [phone] Between 7am to 7pm - call Pager - [phone]  After 7pm go to [email or web address] - password [email or web address]  Call night coverage person covering after 7pm

## 2018-03-17 NOTE — Progress Notes (Signed)
Pt awoke from sleep w/ c/o severe dizziness. VSS. CBG 80. On call made aware. Will continue to monitor.

## 2018-03-18 ENCOUNTER — Other Ambulatory Visit (HOSPITAL_COMMUNITY): Payer: Self-pay | Admitting: Internal Medicine

## 2018-03-18 DIAGNOSIS — M05731 Rheumatoid arthritis with rheumatoid factor of right wrist without organ or systems involvement: Secondary | ICD-10-CM

## 2018-03-18 DIAGNOSIS — R509 Fever, unspecified: Secondary | ICD-10-CM | POA: Diagnosis not present

## 2018-03-18 DIAGNOSIS — M05732 Rheumatoid arthritis with rheumatoid factor of left wrist without organ or systems involvement: Secondary | ICD-10-CM

## 2018-03-18 DIAGNOSIS — R590 Localized enlarged lymph nodes: Secondary | ICD-10-CM | POA: Diagnosis not present

## 2018-03-18 DIAGNOSIS — R748 Abnormal levels of other serum enzymes: Secondary | ICD-10-CM | POA: Diagnosis not present

## 2018-03-18 DIAGNOSIS — D649 Anemia, unspecified: Secondary | ICD-10-CM | POA: Diagnosis not present

## 2018-03-18 LAB — BASIC METABOLIC PANEL
Anion gap: 9 (ref 5–15)
BUN: 8 mg/dL (ref 6–20)
CO2: 20 mmol/L — ABNORMAL LOW (ref 22–32)
Calcium: 8 mg/dL — ABNORMAL LOW (ref 8.9–10.3)
Chloride: 106 mmol/L (ref 98–111)
Creatinine, Ser: 0.71 mg/dL (ref 0.44–1.00)
Glucose, Bld: 73 mg/dL (ref 70–99)
Potassium: 3.8 mmol/L (ref 3.5–5.1)
Sodium: 135 mmol/L (ref 135–145)

## 2018-03-18 LAB — CBC
HCT: 30.1 % — ABNORMAL LOW (ref 36.0–46.0)
Hemoglobin: 9.5 g/dL — ABNORMAL LOW (ref 12.0–15.0)
MCH: 25.7 pg — ABNORMAL LOW (ref 26.0–34.0)
MCHC: 31.6 g/dL (ref 30.0–36.0)
MCV: 81.6 fL (ref 80.0–100.0)
PLATELETS: 123 10*3/uL — AB (ref 150–400)
RBC: 3.69 MIL/uL — ABNORMAL LOW (ref 3.87–5.11)
RDW: 17.2 % — ABNORMAL HIGH (ref 11.5–15.5)
WBC: 9.4 10*3/uL (ref 4.0–10.5)
nRBC: 0.5 % — ABNORMAL HIGH (ref 0.0–0.2)

## 2018-03-18 MED ORDER — NYSTATIN 100000 UNIT/ML MT SUSP
5.0000 mL | Freq: Four times a day (QID) | OROMUCOSAL | Status: DC
  Administered 2018-03-18 – 2018-03-19 (×5): 500000 [IU] via ORAL
  Filled 2018-03-18 (×5): qty 5

## 2018-03-18 NOTE — Consult Note (Signed)
Referring Physician:   Dr. Bonner Puna  Diagnosis Fever, unknown origin  Lactic acidosis  FUO (fever of unknown origin) - Plan: CANCELED: IR Radiologist Eval & Mgmt, CANCELED: IR Radiologist Eval & Mgmt  Staging Cancer Staging No matching staging information was found for the patient.   HPI: 38 y.o. 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 She had gone back to [place] to visit Sept-Oct 2019 and had no symptoms during that time or upon return, but in last January noted first drenching night sweats as well as fatigue, followed shortly by fevers intermittently up to 103F. For the past couple weeks she's had some shortness of breath and decreased appetite as well as noticing tender swollen lymph nodes in the back of her head, neck and shoulders. She was evaluated by her PCP 2/10 and started on augmentin empirically though this did not improve symptoms. CT abdomen/pelvis was essentially unremarkable and CTA chest demonstrated no PE, enlarged mediastinal lymph nodes and axillary lymph nodes.   Hospital course:  She was admitted 2/13 for FUO. She had 103F fevers worsening into 2/15 with WBC trending 15 > 17 > 37 so vancomycin and zosyn empirically given. ID was consulted and has recommended monitoring off antibiotics. Work up including cultures has been negative. Surgery consulted and performed right axillary LN biopsy, sending one node for AFB and another for pathology review. Per discussions with pathology, findings are extremely atypical without ability to reliably diagnose or rule out lymphoma.   Oncology consulted for further evaluation due to adenopathy suspicious for lymphoma.    Past Medical History History reviewed. No pertinent past  medical history.  Past Surgical History Past Surgical History:  Procedure Laterality Date  . AXILLARY LYMPH NODE BIOPSY Right 03/13/2018   Procedure: AXILLARY LYMPH NODE BIOPSY;  Surgeon: Johnathan Hausen, MD;  Location: [facility];  Service: General;  Laterality: Right;    Family History No family history on file.   Social History  reports that she has never smoked. She has never used smokeless tobacco. She reports that she does not drink alcohol or use drugs.  Medications Prior to Admission medications   Medication Sig Start Date End Date Taking? Authorizing Provider  acetaminophen (TYLENOL) 500 MG tablet Take 1,000 mg by mouth every 6 (six) hours as needed for mild pain or headache.   Yes [provider]  amoxicillin-clavulanate (AUGMENTIN) 875-125 MG tablet Take 1 tablet by mouth every 12 (twelve) hours. 03/09/18  Yes Hayden Rasmussen, MD  cetirizine (ZYRTEC) 10 MG tablet Take 10 mg by mouth daily as needed for allergies (itching).    Yes [provider]  cholecalciferol (VITAMIN D3) 25 MCG (1000 UT) tablet Take 1,000 Units by mouth daily.   Yes [provider]  Ferrous Sulfate (IRON PO) Take 1 tablet by mouth daily.   Yes [provider]  glucosamine-chondroitin 500-400 MG tablet Take 2 tablets by mouth daily.    Yes [provider]  hydroxychloroquine (PLAQUENIL) 200 MG tablet Take 400 mg by mouth daily. 02/17/18  Yes [provider]  ibuprofen (ADVIL,MOTRIN) 200 MG tablet Take 400 mg by mouth every 6 (six) hours as needed for headache or moderate pain.   Yes [provider]  meclizine (ANTIVERT) 32 MG tablet Take 1  tablet (32 mg total) by mouth 3 (three) times daily as needed. Patient taking differently: Take 32 mg by mouth 3 (three) times daily as needed for dizziness.  04/13/16  Yes Volanda Napoleon, PA-C  Multiple Vitamins-Minerals (MULTIVITAMIN WITH MINERALS) tablet Take 1 tablet by mouth daily.   Yes [provider]  predniSONE (DELTASONE) 5 MG tablet Take 5 mg by mouth daily.  02/04/18  Yes [provider]  sulfaSALAzine (AZULFIDINE) 500 MG EC tablet Take 500 mg by mouth 2 (two) times daily. 02/25/18  Yes [provider]    Allergies Patient has no known allergies.  Review of Systems Review of Systems - Oncology ROS negative   Physical Exam  Vitals Wt Readings from Last 3 Encounters:  03/18/18 194 lb 10.7 oz (88.3 kg)  03/09/18 179 lb (81.2 kg)  09/25/17 184 lb (83.5 kg)   Temp Readings from Last 3 Encounters:  03/18/18 98.4 F (36.9 C)  03/09/18 98.3 F (36.8 C) (Oral)  09/25/17 98.4 F (36.9 C) (Oral)   BP Readings from Last 3 Encounters:  03/18/18 122/74  03/09/18 (!) 108/59  09/26/17 123/79   Pulse Readings from Last 3 Encounters:  03/18/18 (!) 105  03/09/18 (!) 107  09/26/17 79   Constitutional: Well-developed, well-nourished, and in no distress.   HENT: Head: Normocephalic and atraumatic.  Mouth/Throat: No oropharyngeal exudate. Mucosa moist. Eyes: Pupils are equal, round, and reactive to light. Conjunctivae are normal. No scleral icterus.  Neck: Normal range of motion. Neck supple. No JVD present.  Cardiovascular: Normal rate, regular rhythm and normal heart sounds.  Exam reveals no gallop and no friction rub.   No murmur heard. Pulmonary/Chest: Effort normal and breath sounds normal. No respiratory distress. No wheezes.No rales.  Abdominal: Soft. Bowel sounds are normal. No distension. There is no tenderness. There is no guarding.  Musculoskeletal: No edema or tenderness.  Lymphadenopathy: Palpable adenopathy in left cervical area.  Incision site in right axillary area with no signs of infection.  Neurological: Alert and oriented to person, place, and time. No cranial nerve deficit.  Skin: Skin is warm and dry. No rash noted. No erythema. No pallor.  Psychiatric: Affect and judgment normal.   Labs:   03/18/2018 reviewed and showed WBC 9.4 HB 9.5  plts 123,000.  Chemistries WNL with K+ 3.8 Cr 0.71.    Pathology: Preliminary findings are extremely atypical without ability to reliably diagnose or rule out lymphoma.    CT angio done 03/12/2018 reviewed and showed  IMPRESSION: 1.  Negative examination for pulmonary embolism.  2. Bibasilar scarring or atelectasis.  3. Non-specific enlarged bilateral axillary lymph nodes, largest nodes in the right axilla measuring 2.8 cm.  CT abdomen and pelvis done 03/12/2018 reviewed and showed  IMPRESSION: 1. Minimal sludge in the dependent gallbladder, as seen on prior ultrasound examination. No acute findings.  2. No acute CT findings in the abdomen or pelvis to explain fever. Normal appendix.  Assessment and Plan:  1.  Adenopathy.  Findings reportedly suspicious for lymphoma.  Pt has undergone right axillary LN biopsy that was non-diagnostic.  Will discuss case with pathology.  Will ask IR to review scans to determine if core biopsy can be done.  Rheumatological conditions can also have findings that may appear consistent with lymphoma. Hep and HIV testing negative.    If pt unable to have additional biopsy will recommended outpatient follow-up with repeat imaging once stable for discharge.  Labs done 03/18/2018 reviewed and showed WBC 9.4 HB  9.5 plts 123,000.  Chemistries WNL with K+ 3.8 Cr 0.71.  Will check CBC if any significant lymphocytosis will send flow cytometery.    2.  Fevers.  Pt afebrile.  ID following.  Cultures negative.   3.  Microcytosis.  HB 9.5.  Will check HB electrophoresis, SPEP and ferritin.   4.  RA.  Follow-up with Rheumatology as directed.

## 2018-03-18 NOTE — Progress Notes (Signed)
Patient ID: Erin Clay, female   DOB: Nov 05, 1980, 38 y.o.   MRN: [id number]          [facility] for Infectious Disease    Date of Admission:  03/12/2018     [name]'s fevers are lower on antipyretic therapy.  Her blood cultures remain negative.  AFB smear of her lymph node tissue is negative.  Lymph node pathology report and final cultures are pending.         Michel Bickers, MD [facility] for [facility] [place] [facility] [phone] pager   [phone] cell 03/18/2018, 3:39 PM

## 2018-03-18 NOTE — Progress Notes (Signed)
PROGRESS NOTE  Erin Clay  LEX:[id number] DOB: 08-Oct-1980 DOA: 03/12/2018 PCP: Patient, No Pcp Per   Brief Narrative: Erin Clay is a 38 y.o. 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 She had gone back to [place] to visit Sept-Oct 2019 and had no symptoms during that time or upon return, but in last January noted first drenching night sweats as well as fatigue, followed shortly by fevers intermittently up to 103F. For the past couple weeks she's had some shortness of breath and decreased appetite as well as noticing tender swollen lymph nodes in the back of her head, neck and shoulders. She was evaluated by her PCP 2/10 and started on augmentin empirically though this did not improve symptoms. CT abdomen/pelvis was essentially unremarkable and CTA chest demonstrated no PE, enlarged mediastinal lymph nodes and axillary lymph nodes. She was admitted 2/13 for FUO. She had 103F fevers worsening into 2/15 with WBC trending 15 > 17 > 37 so vancomycin and zosyn empirically given. ID was consulted and has recommended monitoring off antibiotics. Work up including cultures has been negative. Surgery consulted and performed right axillary LN biopsy, sending one node for AFB and another for pathology review. Per discussions with pathology, findings are extremely atypical without ability to reliably diagnose or rule out lymphoma. Oncology has been consulted. Fevers have improved and leukocytosis resolved.   Assessment & Plan: Principal Problem:   FUO (fever of unknown origin) Active Problems:   Rheumatoid arthritis ([place])   Lymphadenopathy, axillary   Normocytic anemia   Elevated liver enzymes  Dyspnea with hypoxia: Hypoxia resolved. No pulmonary  pathology noted on CTA chest including no PE. Echo with EF 60-65%, no WMA's or pericardial effusion.   FUO: Negative flu on 2/10, HIV negative, CMV IgG elevated but IgM negative.  EBV IgM negative. CRP 7.6, ESR normal, LDH 702. TSH normal. Blood cultures from 2/13 negative at 5 days. AFB negative, quantiferon negative. AM cortisol 11.5. No vegetation on TTE. Low suspicion of Felty syndrome without neutropenia or splenomegaly. Propensity for lymphoproliferative disorders usually more tied to MTX than her regimen. - Axillary LN biopsy results are inconclusive. Unable to rule out lymphoma, possibly fulminant reactive process. - ID following - Asked oncology, Dr. Walden Field to assist with recommendations. - Will check CBC w/diff in AM. 2/13 Diff with lymphocytic predominance. - Pathology to test for genetic derangement, to identify clonal population, though this won't return for 10-14 days.  Elevated liver-associated enzymes: Gallbladder sludge on U/S without inflammatory features. Hepatitis panel negative. ?Related to infection/viral syndrome - Recheck in AM.   Anemia of chronic disease:  - Check anemia panel  Anxiety:  - Clonazepam 0.'25mg'$  BID.  Thrush:  - Continue nystatin.  Hypotension with tachycardia: Stable - Continue metoprolol  Rheumatoid arthritis: - Continue plaquenil, sulfasalazine, prednisone  DVT prophylaxis: SCDs, ambulation Code Status: Full Family Communication: None at bedside Disposition Plan: Home once stable and work up completed.  Consultants:   ID  Oncology  Procedures:   Right axillary lymph node biopsy by Dr. Hassell Done  Antimicrobials:  Vancomycin, zosyn   Nystatin   Subjective: Tearful and anxious about Dx, low grade fevers continue, improving. Denies cough, chest pain, abdominal pain, N/V/D, no rashes, myalgias, current arthralgias.  Objective: Vitals:   03/17/18 1427  03/17/18 1948 03/18/18 0524 03/18/18 1518  BP: 103/62 122/69 104/64 122/74    Pulse: (!) 111 (!) 123 (!) 110 (!) 105  Resp:  18 20   Temp: 99.9 F (37.7 C) 100.2 F (37.9 C) 99.2 F (37.3 C) 98.4 F (36.9 C)  TempSrc:  Oral Oral   SpO2: 100% 100% 99% 100%  Weight:   88.3 kg   Height:       No intake or output data in the 24 hours ending 03/18/18 1633 Filed Weights   03/16/18 0506 03/17/18 0438 03/18/18 0524  Weight: 94.9 kg 88 kg 88.3 kg    Gen: 38 y.o. female in no distress Pulm: Non-labored breathing. Clear to auscultation bilaterally.  CV: Regular tachycardia. No murmur, rub, or gallop. No JVD, no pedal edema. GI: Abdomen soft, non-tender, non-distended, with normoactive bowel sounds. No organomegaly or masses felt. Ext: Warm, no deformities Skin: No rashes, lesions or ulcers Neuro: Alert and oriented. No focal neurological deficits. Psych: Judgement and insight appear normal. Mood anxious & affect appropriate.   Data Reviewed: I have personally reviewed following labs and imaging studies  CBC: Recent Labs  Lab 03/12/18 0844 03/13/18 0622 03/14/18 0557 03/15/18 0600 03/16/18 0539 03/17/18 0617 03/18/18 0609  WBC 10.9* 15.9* 17.8* 37.3* 11.8* 7.8 9.4  NEUTROABS 4.6 5.2  --   --   --   --   --   HGB 11.4* 11.2* 9.7* 9.2* 8.9* 10.0* 9.5*  HCT 36.9 37.5 32.1* 29.1* 29.8* 32.9* 30.1*  MCV 85.0 87.2 84.5 81.7 84.4 85.5 81.6  PLT 245 269 298 292 193 178 161*   Basic Metabolic Panel: Recent Labs  Lab 03/12/18 0844  03/14/18 0557 03/15/18 0839 03/16/18 0539 03/17/18 0617 03/18/18 0609  NA 136   < > 138 131* 137 137 135  K 2.9*   < > 3.3* 3.4* 3.5 3.5 3.8  CL 103   < > 106 104 108 106 106  CO2 23   < > 22 18* 20* 22 20*  GLUCOSE 90   < > 93 93 75 77 73  BUN <5*   < > 5* '7 8 8 8  '$ CREATININE 0.60   < > 0.74 0.77 0.68 0.56 0.71  CALCIUM 8.5*   < > 8.4* 7.5* 7.6* 7.9* 8.0*  MG 2.3  --   --   --   --   --   --    < > = values in this interval not displayed.   GFR: Estimated Creatinine Clearance: 111 mL/min (by C-G formula based on SCr  of 0.71 mg/dL). Liver Function Tests: Recent Labs  Lab 03/12/18 0844 03/13/18 0622 03/14/18 0557 03/15/18 0839 03/16/18 0539  AST 157* 192* 179* 226* 179*  ALT 226* 285* 259* 222* 218*  ALKPHOS 150* 151* 190* 220* 226*  BILITOT 1.0 0.8 0.7 1.7* 1.7*  PROT 6.8 5.8* 5.8* 5.0* 5.0*  ALBUMIN 3.4* 2.9* 3.0* 2.6* 2.4*   No results for input(s): LIPASE, AMYLASE in the last 168 hours. No results for input(s): AMMONIA in the last 168 hours. Coagulation Profile: No results for input(s): INR, PROTIME in the last 168 hours. Cardiac Enzymes: No results for input(s): CKTOTAL, CKMB, CKMBINDEX, TROPONINI in the last 168 hours. BNP (last 3 results) No results for input(s): PROBNP in the last 8760 hours. HbA1C: No results for input(s): HGBA1C in the last 72 hours. CBG: Recent Labs  Lab 03/12/18 0757 03/17/18 0118  GLUCAP 86 80   Lipid Profile: No results for input(s):  CHOL, HDL, LDLCALC, TRIG, CHOLHDL, LDLDIRECT in the last 72 hours. Thyroid Function Tests: No results for input(s): TSH, T4TOTAL, FREET4, T3FREE, THYROIDAB in the last 72 hours. Anemia Panel: No results for input(s): VITAMINB12, FOLATE, FERRITIN, TIBC, IRON, RETICCTPCT in the last 72 hours. Urine analysis:    Component Value Date/Time   COLORURINE YELLOW 03/12/2018 1142   APPEARANCEUR HAZY (A) 03/12/2018 1142   LABSPEC 1.006 03/12/2018 1142   PHURINE 6.0 03/12/2018 1142   GLUCOSEU NEGATIVE 03/12/2018 1142   HGBUR NEGATIVE 03/12/2018 1142   BILIRUBINUR NEGATIVE 03/12/2018 1142   KETONESUR 20 (A) 03/12/2018 1142   PROTEINUR NEGATIVE 03/12/2018 1142   NITRITE NEGATIVE 03/12/2018 1142   LEUKOCYTESUR TRACE (A) 03/12/2018 1142   Recent Results (from the past 240 hour(s))  Blood Culture (routine x 2)     Status: None   Collection Time: 03/09/18  8:42 AM  Result Value Ref Range Status   Specimen Description BLOOD RIGHT ANTECUBITAL  Final   Special Requests   Final    BOTTLES DRAWN AEROBIC AND ANAEROBIC Blood Culture  adequate volume   Culture   Final    NO GROWTH 5 DAYS Performed at [facility], [place] [address], [place], [place] [address]    Report Status 03/14/2018 FINAL  Final  Blood Culture (routine x 2)     Status: None   Collection Time: 03/09/18  8:55 AM  Result Value Ref Range Status   Specimen Description BLOOD RIGHT HAND  Final   Special Requests   Final    BOTTLES DRAWN AEROBIC AND ANAEROBIC Blood Culture adequate volume   Culture   Final    NO GROWTH 5 DAYS Performed at [facility], [place] [address], [place], [place] [address]    Report Status 03/14/2018 FINAL  Final  Group A Strep by PCR     Status: None   Collection Time: 03/09/18 12:10 PM  Result Value Ref Range Status   Group A Strep by PCR NOT DETECTED NOT DETECTED Final    Comment: Performed at [facility], [place] [address], [place], [place] [address]  Culture, blood (routine x 2)     Status: None   Collection Time: 03/12/18  9:06 AM  Result Value Ref Range Status   Specimen Description   Final    BLOOD LEFT ANTECUBITAL Performed at [place] [address], [place], [place] [address]    Special Requests   Final    BOTTLES DRAWN AEROBIC AND ANAEROBIC Blood Culture results may not be optimal due to an excessive volume of blood received in culture bottles Performed at [facility] [address] [place], [place] [address]    Culture   Final    NO GROWTH 5 DAYS Performed at [facility], [place] [address] [place], [place] [address]    Report Status 03/17/2018 FINAL  Final  Culture, blood (routine x 2)     Status: None   Collection Time: 03/12/18  9:45 AM  Result Value Ref Range Status   Specimen Description   Final    BLOOD RIGHT ANTECUBITAL Performed at [place] [address], [place], [place] [address]    Special Requests   Final    BOTTLES DRAWN AEROBIC AND ANAEROBIC Blood Culture adequate volume Performed at [place] [facility] [address] [place],  [address]    Culture   Final    NO GROWTH 5 DAYS Performed  at [facility], [place] [address], [place], [place] [address]    Report Status 03/17/2018 FINAL  Final  Culture, Urine     Status: None   Collection Time: 03/12/18 11:42 AM  Result Value Ref Range Status   Specimen Description   Final    URINE, CLEAN CATCH Performed at [facility], [place] [address] [place], [place] [address]    Special Requests   Final    NONE Performed at [facility], [place] [address], [place], [place] [address]    Culture   Final    NO GROWTH Performed at [facility], [place] [address], [place], [place] [address]    Report Status 03/13/2018 FINAL  Final  Acid Fast Smear (AFB)     Status: None   Collection Time: 03/13/18  4:16 PM  Result Value Ref Range Status   AFB Specimen Processing Comment  Final    Comment: Tissue Grinding and Digestion/Decontamination   Acid Fast Smear Negative  Final    Comment: (NOTE) Performed At: [facility] [place], [place] [id number] Rush Farmer MD [id number]    Source (AFB) LYMPH NODE  Final    Comment: RIGHT AXILLARY Performed at [facility], [place] [address], [place], [place] [address]       Radiology Studies: No results found.  Scheduled Meds: . clonazePAM  0.25 mg Oral BID  . hydroxychloroquine  400 mg Oral Daily  . metoprolol tartrate  2.5 mg Intravenous Q6H  . nystatin  5 mL Oral QID  . predniSONE  5 mg Oral QAC breakfast  . sulfaSALAzine  500 mg Oral BID   Continuous Infusions:   LOS: 5 days   Time spent: 25 minutes.  Patrecia Pour, MD [facility] [email or web address] Password [facility] 03/18/2018, 4:33 PM

## 2018-03-19 DIAGNOSIS — R748 Abnormal levels of other serum enzymes: Secondary | ICD-10-CM | POA: Diagnosis not present

## 2018-03-19 DIAGNOSIS — R590 Localized enlarged lymph nodes: Secondary | ICD-10-CM | POA: Diagnosis not present

## 2018-03-19 DIAGNOSIS — D649 Anemia, unspecified: Secondary | ICD-10-CM | POA: Diagnosis not present

## 2018-03-19 DIAGNOSIS — R509 Fever, unspecified: Secondary | ICD-10-CM | POA: Diagnosis not present

## 2018-03-19 LAB — COMPREHENSIVE METABOLIC PANEL
ALT: 127 U/L — ABNORMAL HIGH (ref 0–44)
AST: 70 U/L — ABNORMAL HIGH (ref 15–41)
Albumin: 2.7 g/dL — ABNORMAL LOW (ref 3.5–5.0)
Alkaline Phosphatase: 314 U/L — ABNORMAL HIGH (ref 38–126)
Anion gap: 6 (ref 5–15)
BILIRUBIN TOTAL: 1 mg/dL (ref 0.3–1.2)
BUN: 7 mg/dL (ref 6–20)
CO2: 24 mmol/L (ref 22–32)
Calcium: 8.1 mg/dL — ABNORMAL LOW (ref 8.9–10.3)
Chloride: 105 mmol/L (ref 98–111)
Creatinine, Ser: 0.6 mg/dL (ref 0.44–1.00)
GFR calc Af Amer: >60 mL/min (ref >60)
GFR calc non Af Amer: >60 mL/min (ref >60)
Glucose, Bld: 96 mg/dL (ref 70–99)
Potassium: 3.4 mmol/L — ABNORMAL LOW (ref 3.5–5.1)
Sodium: 135 mmol/L (ref 135–145)
TOTAL PROTEIN: 6.2 g/dL — AB (ref 6.5–8.1)

## 2018-03-19 LAB — CBC WITH DIFFERENTIAL/PLATELET
Abs Immature Granulocytes: 0.13 10*3/uL — ABNORMAL HIGH (ref 0.00–0.07)
BASOS PCT: 0 %
Basophils Absolute: 0 10*3/uL (ref 0.0–0.1)
EOS ABS: 0 10*3/uL (ref 0.0–0.5)
Eosinophils Relative: 0 %
HCT: 27.2 % — ABNORMAL LOW (ref 36.0–46.0)
Hemoglobin: 8.4 g/dL — ABNORMAL LOW (ref 12.0–15.0)
Immature Granulocytes: 2 %
LYMPHS PCT: 56 %
Lymphs Abs: 4.3 10*3/uL — ABNORMAL HIGH (ref 0.7–4.0)
MCH: 26.3 pg (ref 26.0–34.0)
MCHC: 30.9 g/dL (ref 30.0–36.0)
MCV: 85.3 fL (ref 80.0–100.0)
Monocytes Absolute: 0.6 10*3/uL (ref 0.1–1.0)
Monocytes Relative: 8 %
NRBC: 0.4 % — AB (ref 0.0–0.2)
Neutro Abs: 2.7 10*3/uL (ref 1.7–7.7)
Neutrophils Relative %: 34 %
Platelets: 134 10*3/uL — ABNORMAL LOW (ref 150–400)
RBC: 3.19 MIL/uL — ABNORMAL LOW (ref 3.87–5.11)
RDW: 17.6 % — AB (ref 11.5–15.5)
WBC: 7.7 10*3/uL (ref 4.0–10.5)

## 2018-03-19 LAB — FERRITIN: Ferritin: 199 ng/mL (ref 11–307)

## 2018-03-19 LAB — RETICULOCYTES
Immature Retic Fract: 21.1 % — ABNORMAL HIGH (ref 2.3–15.9)
RBC.: 3.19 MIL/uL — AB (ref 3.87–5.11)
Retic Count, Absolute: 77.5 10*3/uL (ref 19.0–186.0)
Retic Ct Pct: 2.4 % (ref 0.4–3.1)

## 2018-03-19 LAB — IRON AND TIBC
Iron: 99 ug/dL (ref 28–170)
Saturation Ratios: 38 % — ABNORMAL HIGH (ref 10.4–31.8)
TIBC: 258 ug/dL (ref 250–450)
UIBC: 159 ug/dL

## 2018-03-19 LAB — FOLATE: Folate: 15.3 ng/mL (ref >5.9)

## 2018-03-19 LAB — VITAMIN B12: Vitamin B-12: 3321 pg/mL — ABNORMAL HIGH (ref 180–914)

## 2018-03-19 MED ORDER — POTASSIUM CHLORIDE CRYS ER 20 MEQ PO TBCR
20.0000 meq | EXTENDED_RELEASE_TABLET | Freq: Once | ORAL | Status: AC
  Administered 2018-03-19: 20 meq via ORAL
  Filled 2018-03-19: qty 1

## 2018-03-19 NOTE — Progress Notes (Signed)
Patient ID: Erin Clay, female   DOB: August 24, 1980, 38 y.o.   MRN: [id number]          [facility] for Infectious Disease    Date of Admission:  03/12/2018     The lymph node biopsy showed nonspecific "fulminant reactive process" by verbal report.  [name] fever curve is improving and she is feeling better.  I agree with outpatient follow-up.  I have added serologic testing for Brucella, leptospirosis, Bartonella and toxoplasma.  She will follow-up with me in clinic on 04/09/2018.         Michel Bickers, MD [facility] for Infectious [place] [facility] [phone] pager   [phone] cell 03/19/2018, 11:06 AM

## 2018-03-19 NOTE — Care Management Note (Addendum)
Case Management Note  Patient Details  Name: Novalynn Branaman MRN: [id number] Date of Birth: 14-Nov-1980  Subjective/Objective:                  discharged  Action/Plan: Pt dcd to home with self care, no cm needs present.  Expected Discharge Date:  03/19/18               Expected Discharge Plan:  Home/Self Care  In-House Referral:     Discharge planning Services  CM Consult  Post Acute Care Choice:    Choice offered to:     DME Arranged:    DME Agency:     HH Arranged:    HH Agency:     Status of Service:  Completed, signed off  If discussed at [employer] of Stay Meetings, dates discussed:    Additional Comments:  Leeroy Cha, RN 03/19/2018, 10:56 AM

## 2018-03-19 NOTE — Progress Notes (Signed)
Patient has discharged to home on 03/19/2018. Discharge instruction including medication and appointment was given to patient. Patient has no question at this time.

## 2018-03-19 NOTE — Discharge Summary (Signed)
Physician Discharge Summary  Erin Clay SHF:[id number] DOB: [birth year] DOA: 03/12/2018  PCP: Patient, No Pcp Per  Admit date: 03/12/2018 Discharge date: 03/19/2018  Admitted From: Home Disposition: Home   Recommendations for Outpatient Follow-up:  1. Follow up with infectious disease clinic to be arranged by Dr. Megan Salon.  2. Follow up with oncology clinic to be arranged by Dr. Walden Field.  Home Health: None Equipment/Devices: None Discharge Condition: Stable CODE STATUS: Full Diet recommendation: As tolerated  Brief/Interim Summary: Erin Clay is a 38 y.o. 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 She had gone back to [place] to visit Sept-Oct 2019 and had no symptoms during that time or upon return, but in last January noted first drenching night sweats as well as fatigue, followed shortly by fevers intermittently up to 103F. For the past couple weeks she's had some shortness of breath and decreased appetite as well as noticing tender swollen lymph nodes in the back of her head, neck and shoulders. She was evaluated by her PCP 2/10 and started on augmentin empirically though this did not improve symptoms. CT abdomen/pelvis was essentially unremarkable and CTA chest demonstrated no PE, enlarged mediastinal lymph nodes and axillary lymph nodes. She was admitted 2/13 for FUO. She had 103F fevers worsening into 2/15 with WBC trending 15 > 17 > 37 so vancomycin and zosyn empirically given. ID was consulted and has recommended monitoring off antibiotics. Work up including cultures has been negative. Surgery consulted and performed right axillary LN biopsy, sending one node for AFB and another for pathology review. Per discussions with  pathology, findings are extremely atypical without ability to reliably diagnose or rule out lymphoma. Oncology has been consulted. Fevers have improved and leukocytosis resolved.   Discharge Diagnoses:  Principal Problem:   FUO (fever of unknown origin) Active Problems:   Rheumatoid arthritis ([place])   Lymphadenopathy, axillary   Normocytic anemia   Elevated liver enzymes  FUO: Negative flu on 2/10, HIV negative, CMV IgG elevated but IgM negative. EBV IgM negative.CRP 7.6, ESR normal, LDH 702. TSH normal. Blood cultures from 2/13 negative at 5 days. AFB negative, quantiferon negative. AM cortisol 11.5. No vegetation on TTE. Low suspicion of Felty syndrome without neutropenia or splenomegaly. Propensity for lymphoproliferative disorders usually more tied to MTX than her regimen. - Discussed LN biopsy results with Dr. Gari Crown on 2/19. Axillary LN biopsy results are inconclusive. Unable to rule out lymphoma, but slightly favoring a fulminant reactive process. - ID following. Discussed with Dr. Megan Salon. Since patient is hemodynamically stable and feels significantly better, she is stable for discharge with close follow up to be arranged. Given negative work up, no antimicrobials to be prescribed at discharge.  - Oncology, Dr. Walden Field, evaluated 2/19. She agrees that outpatient work up would be acceptable. She will arrange follow up.  - Leukocytosis has resolved, though diff continues to show lymphocytic predominance. - Pathology to test for genetic derangement, to identify clonal population, though this won't return for 10-14 days.  Dyspnea with hypoxia: Hypoxia resolved. No pulmonary pathology noted on CTA chest including no PE. Echo with EF 60-65%, no WMA's or pericardial effusion. Feels well now.  Elevated liver-associated enzymes: Gallbladder sludge on U/S without inflammatory features. Hepatitis panel negative. ?Related to infection/viral syndrome - Recheck at  follow up. AST, ALT trending  downward, bilirubin normalized.   Anemia of chronic disease: Anemia panel typical for chronic disease. No bleeding.  - Check CBC at follow up.  Anxiety:  - Clonazepam 0.55m BID.  Thrush:  - Continue nystatin.  Hypotension with tachycardia: Stable - Continue metoprolol  Rheumatoid arthritis: - Continue plaquenil, sulfasalazine, prednisone  Discharge Instructions Discharge Instructions    Call MD for:  difficulty breathing, headache or visual disturbances   Complete by:  As directed    Call MD for:  extreme fatigue   Complete by:  As directed    Call MD for:  hives   Complete by:  As directed    Call MD for:  persistant dizziness or light-headedness   Complete by:  As directed    Call MD for:  persistant nausea and vomiting   Complete by:  As directed    Call MD for:  redness, tenderness, or signs of infection (pain, swelling, redness, odor or green/yellow discharge around incision site)   Complete by:  As directed    Call MD for:  severe uncontrolled pain   Complete by:  As directed    Discharge instructions   Complete by:  As directed    You were evaluated for fevers of unknown origin. Work up has been inconclusive, though you have clinically stabilized to the point that you may be discharged and continue the work up as an outpatient. You will be contacted by the infectious disease clinic and the cancer center clinic to arrange follow up appointments.  - Continue taking medications as you were.  - If you develop worsening fevers, chills, fatigue, weakness, trouble breathing, cough, chest pain, burning with urination, or other bothersome new symptoms, seek medical attention right away.     Allergies as of 03/19/2018   No Known Allergies     Medication List    STOP taking these medications   amoxicillin-clavulanate 875-125 MG tablet Commonly known as:  AUGMENTIN     TAKE these medications   acetaminophen 500 MG tablet Commonly known as:  TYLENOL Take 1,000 mg  by mouth every 6 (six) hours as needed for mild pain or headache.   cetirizine 10 MG tablet Commonly known as:  ZYRTEC Take 10 mg by mouth daily as needed for allergies (itching).   cholecalciferol 25 MCG (1000 UT) tablet Commonly known as:  VITAMIN D3 Take 1,000 Units by mouth daily.   glucosamine-chondroitin 500-400 MG tablet Take 2 tablets by mouth daily.   hydroxychloroquine 200 MG tablet Commonly known as:  PLAQUENIL Take 400 mg by mouth daily.   ibuprofen 200 MG tablet Commonly known as:  ADVIL,MOTRIN Take 400 mg by mouth every 6 (six) hours as needed for headache or moderate pain.   IRON PO Take 1 tablet by mouth daily.   meclizine 32 MG tablet Commonly known as:  ANTIVERT Take 1 tablet (32 mg total) by mouth 3 (three) times daily as needed. What changed:  reasons to take this   multivitamin with minerals tablet Take 1 tablet by mouth daily.   predniSONE 5 MG tablet Commonly known as:  DELTASONE Take 5 mg by mouth daily.   sulfaSALAzine 500 MG EC tablet Commonly known as:  AZULFIDINE Take 500 mg by mouth 2 (two) times daily.      Follow-up Information    CMichel Bickers MD. Call.   Specialty:  Infectious Diseases Why:  if not called to schedule appointment, please contact the office. Contact information: [address] [place] [address] [phone]        [facility]. Call.   Why:  [phone] if you are not contacted by early next week to schedule follow up appointment         No Known Allergies  Consultations:  ID  Oncology  General surgery  Procedures/Studies: Dg Chest 2 View  Result Date: 03/12/2018 CLINICAL DATA:  Dyspnea and fever EXAM: CHEST - 2 VIEW COMPARISON:  Three days ago FINDINGS: Mildly low lung volumes with interstitial crowding. There is no edema, consolidation, effusion, or pneumothorax. Normal heart size and mediastinal contours. IMPRESSION: Low volume chest with mild atelectasis.  Electronically Signed   By: Monte Fantasia M.D.   On: 03/12/2018 10:37   Dg Chest 2 View  Result Date: 03/09/2018 CLINICAL DATA:  Fever EXAM: CHEST - 2 VIEW COMPARISON:  None. FINDINGS: Lungs are clear. Heart size and pulmonary vascularity are normal. No adenopathy. No bone lesions. IMPRESSION: No edema or consolidation. Electronically Signed   By: Lowella Grip III M.D.   On: 03/09/2018 14:02   Ct Angio Chest Pe W And/or Wo Contrast  Result Date: 03/12/2018 CLINICAL DATA:  PE suspected EXAM: CT ANGIOGRAPHY CHEST WITH CONTRAST TECHNIQUE: Multidetector CT imaging of the chest was performed using the standard protocol during bolus administration of intravenous contrast. Multiplanar CT image reconstructions and MIPs were obtained to evaluate the vascular anatomy. CONTRAST:  163m ISOVUE-370 IOPAMIDOL (ISOVUE-370) INJECTION 76% COMPARISON:  Same day chest radiograph FINDINGS: Cardiovascular: Satisfactory opacification of the pulmonary arteries to the segmental level. No evidence of pulmonary embolism. Normal heart size. No pericardial effusion. Mediastinum/Nodes: Non-specific enlarged bilateral axillary lymph nodes, largest nodes in the right axilla measuring 2.8 cm. Thyroid gland, trachea, and esophagus demonstrate no significant findings. Lungs/Pleura: Bibasilar scarring or atelectasis. No pleural effusion or pneumothorax. Upper Abdomen: No acute abnormality. Musculoskeletal: No chest wall abnormality. No acute or significant osseous findings. Review of the MIP images confirms the above findings. IMPRESSION: 1.  Negative examination for pulmonary embolism. 2. Bibasilar scarring or atelectasis. 3. Non-specific enlarged bilateral axillary lymph nodes, largest nodes in the right axilla measuring 2.8 cm. Electronically Signed   By: AEddie CandleM.D.   On: 03/12/2018 15:55   Ct Abdomen Pelvis W Contrast  Result Date: 03/12/2018 CLINICAL DATA:  Fever of unknown origin EXAM: CT ABDOMEN AND PELVIS WITH  CONTRAST TECHNIQUE: Multidetector CT imaging of the abdomen and pelvis was performed using the standard protocol following bolus administration of intravenous contrast. CONTRAST:  1089mISOVUE-370 IOPAMIDOL (ISOVUE-370) INJECTION 76% COMPARISON:  Right upper quadrant ultrasound, 03/09/2018. FINDINGS: Lower chest: Please see separately reported CT examination of the chest. Hepatobiliary: No focal liver abnormality is seen. Minimal sludge in the dependent gallbladder. No gallbladder wall thickening, or biliary dilatation. Pancreas: Unremarkable. No pancreatic ductal dilatation or surrounding inflammatory changes. Spleen: Normal in size without focal abnormality. Adrenals/Urinary Tract: Adrenal glands are unremarkable. Kidneys are normal, without renal calculi, focal lesion, or hydronephrosis. Bladder is unremarkable. Stomach/Bowel: Stomach is within normal limits. Appendix appears normal. No evidence of bowel wall thickening, distention, or inflammatory changes. Vascular/Lymphatic: No significant vascular findings are present. No enlarged abdominal or pelvic lymph nodes. Reproductive: No mass or other abnormality. Other: No abdominal wall hernia or abnormality. No abdominopelvic ascites. Musculoskeletal: No acute or significant osseous findings. IMPRESSION: 1. Minimal sludge in the dependent gallbladder, as seen on prior ultrasound examination. No acute findings. 2. No acute CT findings in the abdomen or pelvis to explain fever.  Normal appendix. Electronically Signed   By: Eddie Candle M.D.   On: 03/12/2018 16:00   US Abdomen Limited Ruq  Result Date: 03/09/2018 CLINICAL DATA:  Elevated LFTs EXAM: ULTRASOUND ABDOMEN LIMITED RIGHT UPPER QUADRANT COMPARISON:  None. FINDINGS: Gallbladder: There is some sludge in the gallbladder with no stones, wall thickening, or pericholecystic fluid. No Murphy's sign. Common bile duct: Diameter: 3.2 mm Liver: No focal lesion identified. Within normal limits in parenchymal  echogenicity. Portal vein is patent on color Doppler imaging with normal direction of blood flow towards the liver. IMPRESSION: Mild sludge in the gallbladder without wall thickening, pericholecystic fluid, or Murphy's sign. No other abnormalities. Electronically Signed   By: Dorise Bullion III M.D   On: 03/09/2018 10:51   Subjective: Feels well, wants to go home. No subjective fevers recently and no further localizing symptoms.  Discharge Exam: Vitals:   03/19/18 0459 03/19/18 0532  BP: 109/61 112/68  Pulse: (!) 120 (!) 102  Resp: 17   Temp: 99.9 F (37.7 C)   SpO2: 96% 99%   General: Pt is alert, awake, not in acute distress Cardiovascular: RRR, S1/S2 +, no rubs, no gallops Respiratory: CTA bilaterally, no wheezing, no rhonchi Abdominal: Soft, NT, ND, bowel sounds + Extremities: No edema, no cyanosis Skin: LN biopsy site c/d/i, no erythema or tenderness.  Lymph: Palpable LNs in left and right supraclavicular space, left axilla.  Labs: BNP (last 3 results) No results for input(s): BNP in the last 8760 hours. Basic Metabolic Panel: Recent Labs  Lab 03/15/18 0839 03/16/18 0539 03/17/18 0617 03/18/18 0609 03/19/18 0623  NA 131* 137 137 135 135  K 3.4* 3.5 3.5 3.8 3.4*  CL 104 108 106 106 105  CO2 18* 20* 22 20* 24  GLUCOSE 93 75 77 73 96  BUN _0 CREATININE 0.77 0.68 0.56 0.71 0.60  CALCIUM 7.5* 7.6* 7.9* 8.0* 8.1*   Liver Function Tests: Recent Labs  Lab 03/13/18 0622 03/14/18 0557 03/15/18 0839 03/16/18 0539 03/19/18 0623  AST 192* 179* 226* 179* 70*  ALT 285* 259* 222* 218* 127*  ALKPHOS 151* 190* 220* 226* 314*  BILITOT 0.8 0.7 1.7* 1.7* 1.0  PROT 5.8* 5.8* 5.0* 5.0* 6.2*  ALBUMIN 2.9* 3.0* 2.6* 2.4* 2.7*   No results for input(s): LIPASE, AMYLASE in the last 168 hours. No results for input(s): AMMONIA in the last 168 hours. CBC: Recent Labs  Lab 03/13/18 0622  03/15/18 0600 03/16/18 0539 03/17/18 0617 03/18/18 0609 03/19/18 0623  WBC  15.9*   < > 37.3* 11.8* 7.8 9.4 7.7  NEUTROABS 5.2  --   --   --   --   --  2.7  HGB 11.2*   < > 9.2* 8.9* 10.0* 9.5* 8.4*  HCT 37.5   < > 29.1* 29.8* 32.9* 30.1* 27.2*  MCV 87.2   < > 81.7 84.4 85.5 81.6 85.3  PLT 269   < > 292 193 178 123* 134*   < > = values in this interval not displayed.   Cardiac Enzymes: No results for input(s): CKTOTAL, CKMB, CKMBINDEX, TROPONINI in the last 168 hours. BNP: Invalid input(s): POCBNP CBG: Recent Labs  Lab 03/17/18 0118  GLUCAP 80   D-Dimer No results for input(s): DDIMER in the last 72 hours. Hgb A1c No results for input(s): HGBA1C in the last 72 hours. Lipid Profile No results for input(s): CHOL, HDL, LDLCALC, TRIG, CHOLHDL, LDLDIRECT in the last 72 hours. Thyroid function studies No results  for input(s): TSH, T4TOTAL, T3FREE, THYROIDAB in the last 72 hours.  Invalid input(s): FREET3 Anemia work up Recent Labs    03/19/18 0623  VITAMINB12 3,321*  FOLATE 15.3  FERRITIN 199  TIBC 258  IRON 99  RETICCTPCT 2.4   Urinalysis    Component Value Date/Time   COLORURINE YELLOW 03/12/2018 1142   APPEARANCEUR HAZY (A) 03/12/2018 1142   LABSPEC 1.006 03/12/2018 1142   PHURINE 6.0 03/12/2018 1142   GLUCOSEU NEGATIVE 03/12/2018 1142   HGBUR NEGATIVE 03/12/2018 1142   BILIRUBINUR NEGATIVE 03/12/2018 1142   KETONESUR 20 (A) 03/12/2018 1142   PROTEINUR NEGATIVE 03/12/2018 1142   NITRITE NEGATIVE 03/12/2018 1142   LEUKOCYTESUR TRACE (A) 03/12/2018 1142    Microbiology Recent Results (from the past 240 hour(s))  Culture, blood (routine x 2)     Status: None   Collection Time: 03/12/18  9:06 AM  Result Value Ref Range Status   Specimen Description   Final    BLOOD LEFT ANTECUBITAL Performed at [facility], [place] [address] [place], [place] [address]    Special Requests   Final    BOTTLES DRAWN AEROBIC AND ANAEROBIC Blood Culture results may not be optimal due to an excessive volume of blood received in culture  bottles Performed at [facility] [address], [place], [place] [address]    Culture   Final    NO GROWTH 5 DAYS Performed at [facility], [place] [address], [place], [place] [address]    Report Status 03/17/2018 FINAL  Final  Culture, blood (routine x 2)     Status: None   Collection Time: 03/12/18  9:45 AM  Result Value Ref Range Status   Specimen Description   Final    BLOOD RIGHT ANTECUBITAL Performed at [facility] [address], [place], [place] [address]    Special Requests   Final    BOTTLES DRAWN AEROBIC AND ANAEROBIC Blood Culture adequate volume Performed at [facility] [address] [place], [place] [address]    Culture   Final    NO GROWTH 5 DAYS Performed at [facility], [place] [address], [place], [place] [address]    Report Status 03/17/2018 FINAL  Final  Culture, Urine     Status: None   Collection Time: 03/12/18 11:42 AM  Result Value Ref Range Status   Specimen Description   Final    URINE, CLEAN CATCH Performed at [facility], [place] [address], [place], [place] [address]    Special Requests   Final    NONE Performed at [facility], [place] [address], [place], [place] [address]    Culture   Final    NO GROWTH Performed at [facility], [place] [address], [place], [place] [address]    Report Status 03/13/2018 FINAL  Final  Fungus Culture With Stain     Status: None (Preliminary result)   Collection Time: 03/13/18  4:16 PM  Result Value Ref Range Status   Fungus Stain Final report  Final    Comment: (NOTE) Performed At: [facility] [place], [place] [id number] Rush Farmer MD LH:[id number]    Fungus (Mycology) Culture PENDING  Incomplete   Fungal Source LYMPH NODE  Final    Comment: RIGHT AXILLARY Performed at [facility], [place] [address] [place], [place] [address]   Acid Fast Smear (AFB)      Status: None  Collection Time: 03/13/18  4:16 PM  Result Value Ref Range Status   AFB Specimen Processing Comment  Final    Comment: Tissue Grinding and Digestion/Decontamination   Acid Fast Smear Negative  Final    Comment: (NOTE) Performed At: [facility] [place], [place] [id number] Rush Farmer MD DX:[id number]    Source (AFB) LYMPH NODE  Final    Comment: RIGHT AXILLARY Performed at [facility], [place] [address], [place], [place] [address]   Fungus Culture Result     Status: None   Collection Time: 03/13/18  4:16 PM  Result Value Ref Range Status   Result 1 Comment  Final    Comment: (NOTE) KOH/Calcofluor preparation:  no fungus observed. Performed At: [facility] [place], [place] [id number] Rush Farmer MD HU:[id number]     Time coordinating discharge: Approximately 40 minutes  Patrecia Pour, MD  [facility] 03/19/2018, [place] PM Pager [phone]

## 2018-03-20 LAB — BARTONELLA ANTIBODY PANEL
B Quintana IgM: NEGATIVE titer
B henselae IgG: NEGATIVE titer
B henselae IgM: NEGATIVE titer
B quintana IgG: NEGATIVE titer

## 2018-03-20 LAB — TOXOPLASMA GONDII ANTIBODY, IGM: Toxoplasma Antibody- IgM: <3 AU/mL (ref 0.0–7.9)

## 2018-03-20 LAB — INFECT DISEASE AB IGM REFLEX 1

## 2018-03-23 ENCOUNTER — Telehealth: Payer: Self-pay | Admitting: Internal Medicine

## 2018-03-23 LAB — LEPTOSPIRA AB SCREEN

## 2018-03-23 NOTE — Telephone Encounter (Signed)
I cld and spoke to the pt who will cb to schedule a hospital follow up w/Dr. Walden Field.

## 2018-03-25 LAB — BRUCELLA ANTIBODY IGM, EIA: Brucella Antibody IgM, EIA: NEGATIVE

## 2018-04-09 ENCOUNTER — Inpatient Hospital Stay: Payer: BLUE CROSS/BLUE SHIELD | Admitting: Internal Medicine

## 2018-04-15 ENCOUNTER — Encounter (HOSPITAL_COMMUNITY): Payer: Self-pay | Admitting: Family Medicine

## 2018-04-15 LAB — FUNGUS CULTURE WITH STAIN

## 2018-04-15 LAB — FUNGAL ORGANISM REFLEX

## 2018-04-15 LAB — FUNGUS CULTURE RESULT

## 2018-04-26 ENCOUNTER — Other Ambulatory Visit: Payer: Self-pay

## 2018-04-26 ENCOUNTER — Emergency Department (HOSPITAL_COMMUNITY)
Admission: EM | Admit: 2018-04-26 | Discharge: 2018-04-26 | Disposition: A | Discharge status: Home / Self Care | Payer: BLUE CROSS/BLUE SHIELD | Attending: Emergency Medicine | Admitting: Emergency Medicine

## 2018-04-26 ENCOUNTER — Encounter (HOSPITAL_COMMUNITY): Payer: Self-pay

## 2018-04-26 DIAGNOSIS — M2550 Pain in unspecified joint: Secondary | ICD-10-CM

## 2018-04-26 DIAGNOSIS — M130 Polyarthritis, unspecified: Secondary | ICD-10-CM | POA: Insufficient documentation

## 2018-04-26 DIAGNOSIS — Z79899 Other long term (current) drug therapy: Secondary | ICD-10-CM | POA: Insufficient documentation

## 2018-04-26 HISTORY — DX: Rheumatoid arthritis, unspecified: M06.9

## 2018-04-26 LAB — URINALYSIS, ROUTINE W REFLEX MICROSCOPIC
Bilirubin Urine: NEGATIVE
Glucose, UA: NEGATIVE mg/dL
Ketones, ur: NEGATIVE mg/dL
Leukocytes,Ua: NEGATIVE
Nitrite: NEGATIVE
Protein, ur: NEGATIVE mg/dL
Specific Gravity, Urine: 1.004 — ABNORMAL LOW (ref 1.005–1.030)
pH: 8 (ref 5.0–8.0)

## 2018-04-26 MED ORDER — NAPROXEN 500 MG PO TABS: 500.0000 mg | ORAL_TABLET | Freq: Two times a day (BID) | ORAL | 0 refills | Status: DC

## 2018-04-26 MED ORDER — DEXAMETHASONE SODIUM PHOSPHATE 10 MG/ML IJ SOLN: 10.0000 mg | Freq: Once | INTRAMUSCULAR | Status: DC

## 2018-04-26 MED ORDER — NAPROXEN 500 MG PO TABS
500.0000 mg | ORAL_TABLET | Freq: Once | ORAL | Status: AC
  Administered 2018-04-26: 500 mg via ORAL
  Filled 2018-04-26: qty 1

## 2018-04-26 MED ORDER — DEXAMETHASONE SODIUM PHOSPHATE 10 MG/ML IJ SOLN
10.0000 mg | Freq: Once | INTRAMUSCULAR | Status: AC
  Administered 2018-04-26: 10 mg via INTRAMUSCULAR
  Filled 2018-04-26: qty 1

## 2018-04-26 NOTE — Discharge Instructions (Signed)
You were seen in the ED for diffuse joint pains.  This is likely from rheumatoid arthritis.  Continue all your medications as prescribed by rheumatology.  You were given a shot of steroids here.  For pain control take 500 mg of naproxen every 12 hours and 500 to 1000 mg of acetaminophen every 6-8 hours.  I recommend you call your rheumatologist tomorrow morning to check in to ensure that he does not want to change any of your medications.  Further long-term management of your rheumatoid arthritis pain needs to be done by your rheumatologist or primary care doctor.  Return to the ER if there is any fever, focal joint pain, swelling, redness, warmth.

## 2018-04-26 NOTE — ED Notes (Signed)
Bed: WA09 Expected date:  Expected time:  Means of arrival:  Comments: Body aches

## 2018-04-26 NOTE — ED Triage Notes (Signed)
Pt c/o continued all over joint pain since being discharged from [facility] for a skin condition. Pt has a hx of RA. Pt sts she has been taking methotrexate, prednisone, tylenol, and ibuprofen with minimal relief. Pt denies any recent travel, cough, or fever. Pt is unable to walk.

## 2018-04-26 NOTE — ED Provider Notes (Signed)
[facility] DEPT Provider Note   CSN: [id number] Arrival date & time: 04/26/18  4098    History   Chief Complaint Chief Complaint  Patient presents with   Generalized Body Aches    joint pain    HPI Erin Clay is a 38 y.o. female with history of rheumatoid arthritis on sulfasalazine, Plaquenil, daily prednisone, methotrexate, is here for evaluation of "joint pain".  Described as moderate to severe.  Onset on 3/4 when she arrived home after being discharged from [facility].  Described as throbbing, constant but significantly worsening with movement and weightbearing.  The joint pain is localized to her bilateral shoulders, bilateral wrists, bilateral knees and "groin".  Typically her RA gives her joint pain to 1 or 2 joints at a time but never diffusely or this severe.  She called her rheumatologist 1 week ago for the ongoing joint pain who told her to increase her prednisone to 20 mg and continue taking NSAIDs.  She last took her prednisone 20 mg and ibuprofen/Tylenol earlier this morning around 6 AM with mild relief of her pain, is feeling some relief now.  Pain is an 8/10.  Better if she stays still.  In the last 2 to 3 weeks she has been needing help from her mom to ambulate around her house due to the joint pain.  Denies any trauma.  Denies any focal joint swelling, redness, warmth, fevers, neck pain or rigidity, back pain. No IVDU.   Patient admitted to [facility] discharged on 3/4.  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  States her rash is significantly improved with topical medicines and hydroxyzine.  Patient also had recent [facility] admission in February for fever of unknown origin, diffuse lymphadenopathy.  She had lymph node biopsies done which were inconclusive but lymphoma was not excluded.  She is supposed to follow-up with oncology but has not been able to in the  past month.  States she called him 3 weeks ago but has not called back because she is in so much joint pain.  Denies return of fever, cough, chest pain, shortness breath, abdominal pain, changes to bowel movements, and urinary symptoms, unexpected weight loss, return of lymph node swelling since discharge.      HPI  Past Medical History:  Diagnosis Date   RA (rheumatoid arthritis) ([place])     Patient Active Problem List   Diagnosis Date Noted   Rheumatoid arthritis ([place]) 03/13/2018   Lymphadenopathy, axillary 03/13/2018   FUO (fever of unknown origin) 03/13/2018   Normocytic anemia 03/13/2018   Elevated liver enzymes 03/13/2018    Past Surgical History:  Procedure Laterality Date   AXILLARY LYMPH NODE BIOPSY Right 03/13/2018   Procedure: AXILLARY LYMPH NODE BIOPSY;  Surgeon: Johnathan Hausen, MD;  Location: [facility];  Service: General;  Laterality: Right;     OB History   No obstetric history on file.      Home Medications    Prior to Admission medications   Medication Sig Start Date End Date Taking? Authorizing Provider  acetaminophen (TYLENOL) 500 MG tablet Take 1,000 mg by mouth every 6 (six) hours as needed for mild pain or headache.   Yes [provider]  folic acid (FOLVITE) 1 MG tablet Take 1 mg by mouth daily.   Yes [provider]  ibuprofen (ADVIL,MOTRIN) 200 MG tablet Take 400 mg by mouth every 6 (six) hours as needed for headache or  moderate pain.   Yes [provider]  methotrexate (RHEUMATREX) 2.5 MG tablet Take 25 mg by mouth once a week. Caution:Chemotherapy. Protect from light. Pt takes on Wednesday ( pt takes 10 of the 2.5 mg tablets at a time)   Yes [provider]  meclizine (ANTIVERT) 32 MG tablet Take 1 tablet (32 mg total) by mouth 3 (three) times daily as needed. Patient not taking: Reported on 04/26/2018 04/13/16   [place] Lanius A, PA-C  naproxen (NAPROSYN) 500 MG tablet Take 1 tablet (500 mg total) by mouth 2  (two) times daily. 04/26/18   Kinnie Feil, PA-C    Family History History reviewed. No pertinent family history.  Social History Social History   Tobacco Use   Smoking status: Never Smoker   Smokeless tobacco: Never Used  Substance Use Topics   Alcohol use: No   Drug use: No     Allergies   Sulfasalazine   Review of Systems Review of Systems  Musculoskeletal: Positive for arthralgias.  All other systems reviewed and are negative.    Physical Exam Updated Vital Signs BP 136/86 (BP Location: Right Arm)    Pulse (!) 105    Temp 98.3 F (36.8 C) (Oral)    Resp 20    Ht 5\' 7"  (1.702 m)    Wt 79.4 kg    LMP 04/24/2018    SpO2 100%    BMI 27.41 kg/m   Physical Exam Vitals signs and nursing note reviewed.  Constitutional:      Appearance: She is well-developed.     Comments: Non toxic  HENT:     Head: Normocephalic and atraumatic.     Nose: Nose normal.  Eyes:     Conjunctiva/sclera: Conjunctivae normal.     Pupils: Pupils are equal, round, and reactive to light.  Neck:     Musculoskeletal: Normal range of motion.  Cardiovascular:     Rate and Rhythm: Normal rate and regular rhythm.  Pulmonary:     Effort: Pulmonary effort is normal.     Breath sounds: Normal breath sounds.  Abdominal:     General: Bowel sounds are normal.     Palpations: Abdomen is soft.     Tenderness: There is no abdominal tenderness.     Comments: No G/R/R. No suprapubic or CVA tenderness. Negative Murphy's and McBurney's. Active BS to lower quadrants.   Musculoskeletal: Normal range of motion.        General: Tenderness present.     Comments: Patient needs assistance rolling on the bed for exam.  Pain reported with repositioning/movements at the shoulders, hands, knees and hips.  Diffuse pain to the bilateral shoulders, wrists, knees, hips with gentle slow passive ROM.  No obvious bony deformities. No focal joint edema, erythema, warmth to these joints. No leg shortening or rotation.  Spontaneously moves extremities around the bed, repositions in bed to "ease the pain" CTL spine: No midline tenderness.  Full range of motion of the neck without any rigidity or pain.  Skin:    General: Skin is warm and dry.     Capillary Refill: Capillary refill takes less than 2 seconds.     Comments: No erythema, edema, warmth to joints of the upper and lower extremities, hips or spine.  Neurological:     Mental Status: She is alert and oriented to person, place, and time.     Comments: Sensation to light touch intact in upper and lower extremities.  Helps with repositioning, pulls her  self on the bed and rolls.  Strength in upper and lower extremities intact bilaterally  Psychiatric:        Behavior: Behavior normal.      ED Treatments / Results  Labs (all labs ordered are listed, but only abnormal results are displayed) Labs Reviewed  URINALYSIS, ROUTINE W REFLEX MICROSCOPIC - Abnormal; Notable for the following components:      Result Value   Color, Urine STRAW (*)    Specific Gravity, Urine 1.004 (*)    Hgb urine dipstick LARGE (*)    Bacteria, UA RARE (*)    All other components within normal limits    EKG None  Radiology No results found.  Procedures Procedures (including critical care time)  Medications Ordered in ED Medications  naproxen (NAPROSYN) tablet 500 mg (500 mg Oral Given 04/26/18 1101)  dexamethasone (DECADRON) injection 10 mg (10 mg Intramuscular Given 04/26/18 1101)     Initial Impression / Assessment and Plan / ED Course  I have reviewed the triage vital signs and the nursing notes.  Pertinent labs & imaging results that were available during my care of the patient were reviewed by me and considered in my medical decision making (see chart for details).  Clinical Course as of Apr 25 1128  Sun Apr 26, 2018  1111 Starting her period today, spotting  Hgb urine dipstick(!): LARGE [CG]    Clinical Course User Index [CG] Kinnie Feil, PA-C         Atraumatic, diffuse arthralgias for 2 to 3 weeks likely from RA flare. Compliant with sulfasalazine, Plaquenil, daily prednisone. Just started methotrexate 2 days ago.  Arthralgias consistent with her previous RA flares per patient now slightly more intense and involving more joints.  No concerning associated symptoms such as fevers, joint swelling redness, trauma, constitutional symptoms.  No IV drug use.  No signs of overlying cellulitis.  This is not consistent with gout, septic arthritis.  Doubt infectious or post -infectious arthrlagia, fractures.  Afebrile.  Nontoxic-appearing.  Exam is overall reassuring.  I do not think there is indication for emergent lab work, imaging.  We will give her IM steroids, naproxen.  No evidence that opioids are helpful and RA and will defer this.  Provided reassurance and encourage patient to follow-up with rheumatology.  She has a complex PMH including recent initiation of work-up for lymphadenopathy, intermittent fevers and chills.  I have [facility] in the last month.  She has had extensive work-up.  Lymph node biopsy results very atypical but not conclusive and lymphoma not able to be ruled out.  She is supposed to follow-up with oncology for further work-up but has not done so - encouraged her to call and f/u as soon as possible. Return precautions given. Pt in agreement with this.  Final Clinical Impressions(s) / ED Diagnoses   Final diagnoses:  Polyarthralgia    ED Discharge Orders         Ordered    naproxen (NAPROSYN) 500 MG tablet  2 times daily     04/26/18 1128           Kinnie Feil, [place] 04/26/18 1130    Malvin Johns, MD 04/26/18 1139

## 2018-04-26 NOTE — ED Notes (Signed)
PT DISCHARGED. INSTRUCTIONS AND PRESCRIPTION GIVEN. AAOX4. PT IN NO APPARENT DISTRESS WITH MODERATE PAIN. THE OPPORTUNITY TO ASK QUESTIONS WAS PROVIDED.

## 2018-04-29 LAB — ACID FAST CULTURE WITH REFLEXED SENSITIVITIES (MYCOBACTERIA): Acid Fast Culture: NEGATIVE

## 2019-02-08 DIAGNOSIS — Z7952 Long term (current) use of systemic steroids: Secondary | ICD-10-CM | POA: Diagnosis not present

## 2019-02-08 DIAGNOSIS — C8400 Mycosis fungoides, unspecified site: Secondary | ICD-10-CM | POA: Diagnosis not present

## 2019-02-08 DIAGNOSIS — M069 Rheumatoid arthritis, unspecified: Secondary | ICD-10-CM | POA: Diagnosis not present

## 2019-02-08 DIAGNOSIS — R918 Other nonspecific abnormal finding of lung field: Secondary | ICD-10-CM | POA: Diagnosis not present

## 2019-02-08 DIAGNOSIS — Z79899 Other long term (current) drug therapy: Secondary | ICD-10-CM | POA: Diagnosis not present

## 2019-02-08 DIAGNOSIS — R9389 Abnormal findings on diagnostic imaging of other specified body structures: Secondary | ICD-10-CM | POA: Diagnosis not present

## 2019-02-08 DIAGNOSIS — R590 Localized enlarged lymph nodes: Secondary | ICD-10-CM | POA: Diagnosis not present

## 2019-02-08 DIAGNOSIS — C8409 Mycosis fungoides, extranodal and solid organ sites: Secondary | ICD-10-CM | POA: Diagnosis not present

## 2019-02-08 DIAGNOSIS — U071 COVID-19: Secondary | ICD-10-CM | POA: Diagnosis not present

## 2019-02-08 DIAGNOSIS — R591 Generalized enlarged lymph nodes: Secondary | ICD-10-CM | POA: Diagnosis not present

## 2019-02-08 DIAGNOSIS — J984 Other disorders of lung: Secondary | ICD-10-CM | POA: Diagnosis not present

## 2019-02-18 DIAGNOSIS — M059 Rheumatoid arthritis with rheumatoid factor, unspecified: Secondary | ICD-10-CM | POA: Diagnosis not present

## 2019-02-18 DIAGNOSIS — C8400 Mycosis fungoides, unspecified site: Secondary | ICD-10-CM | POA: Diagnosis not present

## 2019-02-18 DIAGNOSIS — M0579 Rheumatoid arthritis with rheumatoid factor of multiple sites without organ or systems involvement: Secondary | ICD-10-CM | POA: Diagnosis not present

## 2019-02-18 DIAGNOSIS — R21 Rash and other nonspecific skin eruption: Secondary | ICD-10-CM | POA: Diagnosis not present

## 2019-02-26 DIAGNOSIS — M0579 Rheumatoid arthritis with rheumatoid factor of multiple sites without organ or systems involvement: Secondary | ICD-10-CM | POA: Diagnosis not present

## 2019-02-26 DIAGNOSIS — C8400 Mycosis fungoides, unspecified site: Secondary | ICD-10-CM | POA: Diagnosis not present

## 2019-02-26 DIAGNOSIS — Z5111 Encounter for antineoplastic chemotherapy: Secondary | ICD-10-CM | POA: Diagnosis not present

## 2019-04-07 DIAGNOSIS — M069 Rheumatoid arthritis, unspecified: Secondary | ICD-10-CM | POA: Diagnosis not present

## 2019-04-07 DIAGNOSIS — Z20822 Contact with and (suspected) exposure to covid-19: Secondary | ICD-10-CM | POA: Diagnosis not present

## 2019-05-03 DIAGNOSIS — Z79899 Other long term (current) drug therapy: Secondary | ICD-10-CM | POA: Diagnosis not present

## 2019-05-03 DIAGNOSIS — D509 Iron deficiency anemia, unspecified: Secondary | ICD-10-CM | POA: Diagnosis not present

## 2019-05-03 DIAGNOSIS — M069 Rheumatoid arthritis, unspecified: Secondary | ICD-10-CM | POA: Diagnosis not present

## 2019-05-03 DIAGNOSIS — C8400 Mycosis fungoides, unspecified site: Secondary | ICD-10-CM | POA: Diagnosis not present

## 2019-05-27 DIAGNOSIS — H532 Diplopia: Secondary | ICD-10-CM | POA: Diagnosis not present

## 2019-06-01 DIAGNOSIS — Z79899 Other long term (current) drug therapy: Secondary | ICD-10-CM | POA: Diagnosis not present

## 2019-06-01 DIAGNOSIS — C8400 Mycosis fungoides, unspecified site: Secondary | ICD-10-CM | POA: Diagnosis not present

## 2019-06-01 DIAGNOSIS — M059 Rheumatoid arthritis with rheumatoid factor, unspecified: Secondary | ICD-10-CM | POA: Diagnosis not present

## 2019-07-31 ENCOUNTER — Other Ambulatory Visit: Payer: Self-pay

## 2019-07-31 ENCOUNTER — Encounter (HOSPITAL_COMMUNITY): Payer: Self-pay | Admitting: Emergency Medicine

## 2019-07-31 ENCOUNTER — Emergency Department (HOSPITAL_COMMUNITY)
Admission: EM | Admit: 2019-07-31 | Discharge: 2019-07-31 | Disposition: A | Discharge status: Home / Self Care | Payer: 59 | Attending: Emergency Medicine | Admitting: Emergency Medicine

## 2019-07-31 DIAGNOSIS — F329 Major depressive disorder, single episode, unspecified: Secondary | ICD-10-CM | POA: Diagnosis present

## 2019-07-31 DIAGNOSIS — Z6379 Other stressful life events affecting family and household: Secondary | ICD-10-CM | POA: Insufficient documentation

## 2019-07-31 NOTE — Discharge Instructions (Signed)
Below, I have attached resources that you can use from [facility] health to help support you through your stress.    The healthcare profession is rewarding, satisfying and tough and the [facility]) is here to help. Open to all employees and their immediate household family members, services are free and confidential. From individual and group counseling, workshops and more, our [facility] team is right here w/you. If you are experiencing a crisis and need immediate assistance, we have a 24/7 Crisis Line available: 24/7 Crisis line: Call [phone] press #1 OR [phone]   Current Resources Available Virtual Visits: Call [phone] for details or [facility] employees Click Here. 15 Minute "Ask the Counselor" Consultations: (Open to [facility] employees) Click Here [place] and print one of our brochures to share with someone that may need assistance in these areas:  Stress Stress and Anxiety Relationships Depression Services offered Confidential assessment of problems Counseling for individuals, couples, and families Counseling for adults, adolescents, or children Referrals to other specialized services Frequently Asked Questions:  Why use [facility]?  Benefits of using the [facility] include help with minimizing the effects of stress, anxiety and job burnout that can alter relationships, work abilities, and life satisfaction  What is the cost for a session with [facility]?  There is no cost for our services.   How many sessions does [facility] offer?      There is no set limit on the number of visits. Each therapist will provide a clear treatment program for their clients. While visits are not unlimited, if additional services are needed, a referral to the proper professional will be made.   What are the hours of availability?  Monday-Friday: 8:00 am-7:00 pm  If I'm having a crisis, how do I get in touch with [facility]?  Our 24/7/365  Help Line is available. Call [phone] and follow the instructions in the message.  Does the [facility] prescribe medication?  No  If I am treating with an [facility] therapist, will my boss find out?  All services are confidential, voluntary and nothing will be noted in any employee record or known by your managers.   What are the [facility] therapist credentials?  [facility] staff members are highly-trained compassionate psychotherapists. They bring a wide range of talents to the service and have more than 100 years of combined experience. Meet the Team to learn more about our individual therapists.  Call [phone] to connect with [facility] counselors.

## 2019-07-31 NOTE — ED Triage Notes (Signed)
Patient here from home reporting depression, recent diagnosis of cancer. Fearful of diagnosis. Patient reports that she does not need to be here. Thought she was just talking to a therapist to communicate feelings, "and mobile crisis brought me here". SI with no plan or intent.

## 2019-08-01 NOTE — ED Provider Notes (Signed)
[facility] DEPT Provider Note   CSN: [id number] Arrival date & time: 07/31/19  1429     History Chief Complaint  Patient presents with  . Depression    Erin Clay is a 39 y.o. female.  HPI    39 year old female with history of rheumatoid arthritis comes in a chief complaint of depression. She reports that she has been diagnosed with rheumatoid arthritis and related complications recently.  Ever since her diagnosis she has had episodes of having negative outlook towards life.  She is also noted that her capacity to work, and participate in other activities and life has declined since the diagnosis.  Additionally she sometimes has not found support from her family members.  Today she called suicide line because she had a thought of " what is the point of living this way", in her mind.  They brought her to the ER for further assessment.  Patient informs me that although she has had those type of thoughts, she has no intent or plan on hurting herself.  She is going to become a nurse soon, she has family that loves her, and her boyfriend who is also advancing in his career.  She just needed to talk to someone, his family and is not always supportive and listening, and therefore she called the crisis line.  She is already feeling a lot better and appreciate of everything had of her.  She thinks she just needs to be connected with a therapist, as she is not always depressed, she often has very good days but every now and then she has some rough ones.  Past Medical History:  Diagnosis Date  . RA (rheumatoid arthritis) [facility])     Patient Active Problem List   Diagnosis Date Noted  . Rheumatoid arthritis ([place]) 03/13/2018  . Lymphadenopathy, axillary 03/13/2018  . FUO (fever of unknown origin) 03/13/2018  . Normocytic anemia 03/13/2018  . Elevated liver enzymes 03/13/2018    Past Surgical History:  Procedure Laterality Date  . AXILLARY LYMPH NODE BIOPSY  Right 03/13/2018   Procedure: AXILLARY LYMPH NODE BIOPSY;  Surgeon: Johnathan Hausen, MD;  Location: [facility];  Service: General;  Laterality: Right;     OB History   No obstetric history on file.     No family history on file.  Social History   Tobacco Use  . Smoking status: Never Smoker  . Smokeless tobacco: Never Used  Vaping Use  . Vaping Use: Never used  Substance Use Topics  . Alcohol use: No  . Drug use: No    Home Medications Prior to Admission medications   Medication Sig Start Date End Date Taking? Authorizing Provider  acetaminophen (TYLENOL) 500 MG tablet Take 1,000 mg by mouth every 6 (six) hours as needed for mild pain or headache.    [provider]  folic acid (FOLVITE) 1 MG tablet Take 1 mg by mouth daily.    [provider]  ibuprofen (ADVIL,MOTRIN) 200 MG tablet Take 400 mg by mouth every 6 (six) hours as needed for headache or moderate pain.    [provider]  meclizine (ANTIVERT) 32 MG tablet Take 1 tablet (32 mg total) by mouth 3 (three) times daily as needed. Patient not taking: Reported on 04/26/2018 04/13/16   Volanda Napoleon, PA-C  methotrexate (RHEUMATREX) 2.5 MG tablet Take 25 mg by mouth once a week. Caution:Chemotherapy. Protect from light. Pt takes on Wednesday ( pt takes 10 of the 2.5 mg tablets at a  time)    [provider]  naproxen (NAPROSYN) 500 MG tablet Take 1 tablet (500 mg total) by mouth 2 (two) times daily. 04/26/18   Kinnie Feil, PA-C    Allergies    Sulfasalazine  Review of Systems   Review of Systems  Constitutional: Positive for activity change.  Psychiatric/Behavioral: Negative for suicidal ideas.    Physical Exam Updated Vital Signs BP 136/70 (BP Location: Right Arm)   Pulse 89   Temp 98.9 F (37.2 C) (Oral)   Resp 19   SpO2 100%   Physical Exam Vitals and nursing note reviewed.  Constitutional:      Appearance: She is well-developed.  HENT:     Head: Normocephalic and  atraumatic.  Eyes:     Pupils: Pupils are equal, round, and reactive to light.  Cardiovascular:     Rate and Rhythm: Normal rate and regular rhythm.     Heart sounds: Normal heart sounds.  Pulmonary:     Effort: Pulmonary effort is normal. No respiratory distress.  Abdominal:     General: There is no distension.     Palpations: Abdomen is soft.     Tenderness: There is no abdominal tenderness. There is no guarding or rebound.  Musculoskeletal:     Cervical back: Neck supple.  Skin:    General: Skin is warm and dry.  Neurological:     Mental Status: She is alert and oriented to person, place, and time.  Psychiatric:        Mood and Affect: Mood normal.        Behavior: Behavior normal.        Thought Content: Thought content normal.        Judgment: Judgment normal.     ED Results / Procedures / Treatments   Labs (all labs ordered are listed, but only abnormal results are displayed) Labs Reviewed - No data to display  EKG None  Radiology No results found.  Procedures Procedures (including critical care time)  Medications Ordered in ED Medications - No data to display  ED Course  I have reviewed the triage vital signs and the nursing notes.  Pertinent labs & imaging results that were available during my care of the patient were reviewed by me and considered in my medical decision making (see chart for details).    MDM Rules/Calculators/A&P                          39 year old female comes in a chief complaint of behavioral health evaluation.  She had called the crisis line because she was having a rough day.  The stressor is her medical condition.  She just needed to talk to someone, and she felt it was better to call crisis line and then to talk to her family.  Patient feels like she likely just needs to talk to the therapist.  She does not think she is depressed clinically.  I also, after talking to her do not think she is clinically depressed, but might be  overwhelmed with everything going on in her life.  I have provided her with [facility] health employee support contact information and discussed strict ER return precautions with her.  Mother at the bedside and is also willing to take care of her.  Stable for discharge.  Final Clinical Impression(s) / ED Diagnoses Final diagnoses:  Stress due to illness of family member    Rx / DC Orders ED Discharge Orders  None       Varney Biles, MD 08/01/19 1805

## 2019-08-09 DIAGNOSIS — D509 Iron deficiency anemia, unspecified: Secondary | ICD-10-CM | POA: Diagnosis not present

## 2019-08-09 DIAGNOSIS — C8400 Mycosis fungoides, unspecified site: Secondary | ICD-10-CM | POA: Diagnosis not present

## 2019-08-27 DIAGNOSIS — M069 Rheumatoid arthritis, unspecified: Secondary | ICD-10-CM | POA: Diagnosis not present

## 2019-08-27 DIAGNOSIS — H532 Diplopia: Secondary | ICD-10-CM | POA: Diagnosis not present

## 2019-08-27 DIAGNOSIS — Z79899 Other long term (current) drug therapy: Secondary | ICD-10-CM | POA: Diagnosis not present

## 2019-08-27 DIAGNOSIS — H40003 Preglaucoma, unspecified, bilateral: Secondary | ICD-10-CM | POA: Diagnosis not present

## 2019-09-03 DIAGNOSIS — C8409 Mycosis fungoides, extranodal and solid organ sites: Secondary | ICD-10-CM | POA: Diagnosis not present

## 2019-09-03 DIAGNOSIS — Z5111 Encounter for antineoplastic chemotherapy: Secondary | ICD-10-CM | POA: Diagnosis not present

## 2019-09-03 DIAGNOSIS — M0579 Rheumatoid arthritis with rheumatoid factor of multiple sites without organ or systems involvement: Secondary | ICD-10-CM | POA: Diagnosis not present

## 2019-09-17 DIAGNOSIS — M0579 Rheumatoid arthritis with rheumatoid factor of multiple sites without organ or systems involvement: Secondary | ICD-10-CM | POA: Diagnosis not present

## 2019-09-17 DIAGNOSIS — Z5111 Encounter for antineoplastic chemotherapy: Secondary | ICD-10-CM | POA: Diagnosis not present

## 2019-09-17 DIAGNOSIS — C8409 Mycosis fungoides, extranodal and solid organ sites: Secondary | ICD-10-CM | POA: Diagnosis not present

## 2019-09-21 IMAGING — DX DG HAND COMPLETE 3+V*R*
3 series · 3 of 3 positions shown · non-contrast
Comparison: Wrist series today

CLINICAL DATA: Pain

EXAM:
RIGHT HAND - COMPLETE 3+ VIEW

[hand pa]
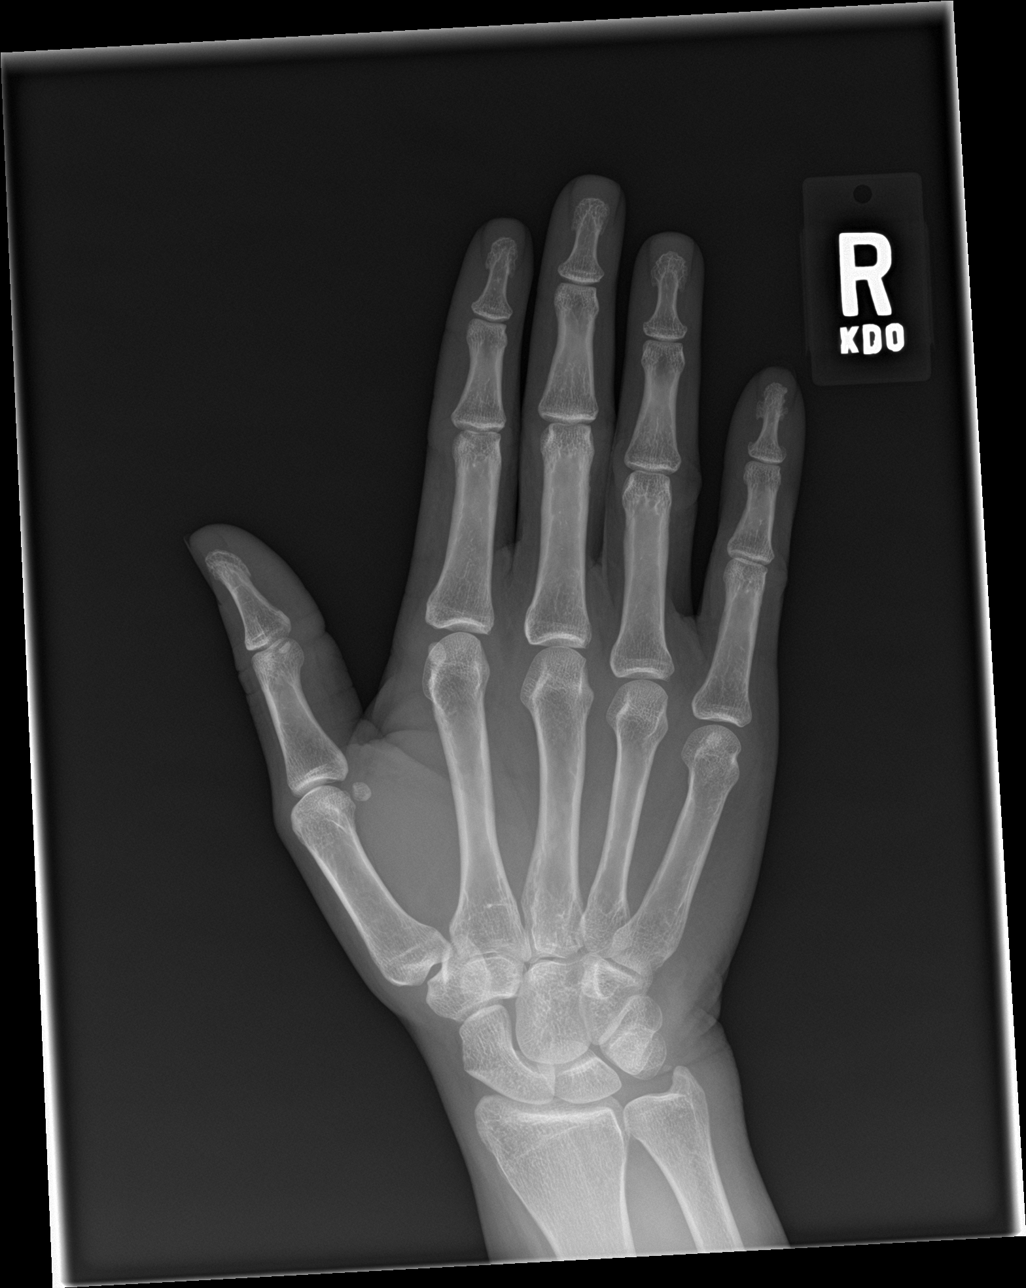

[hand obl]
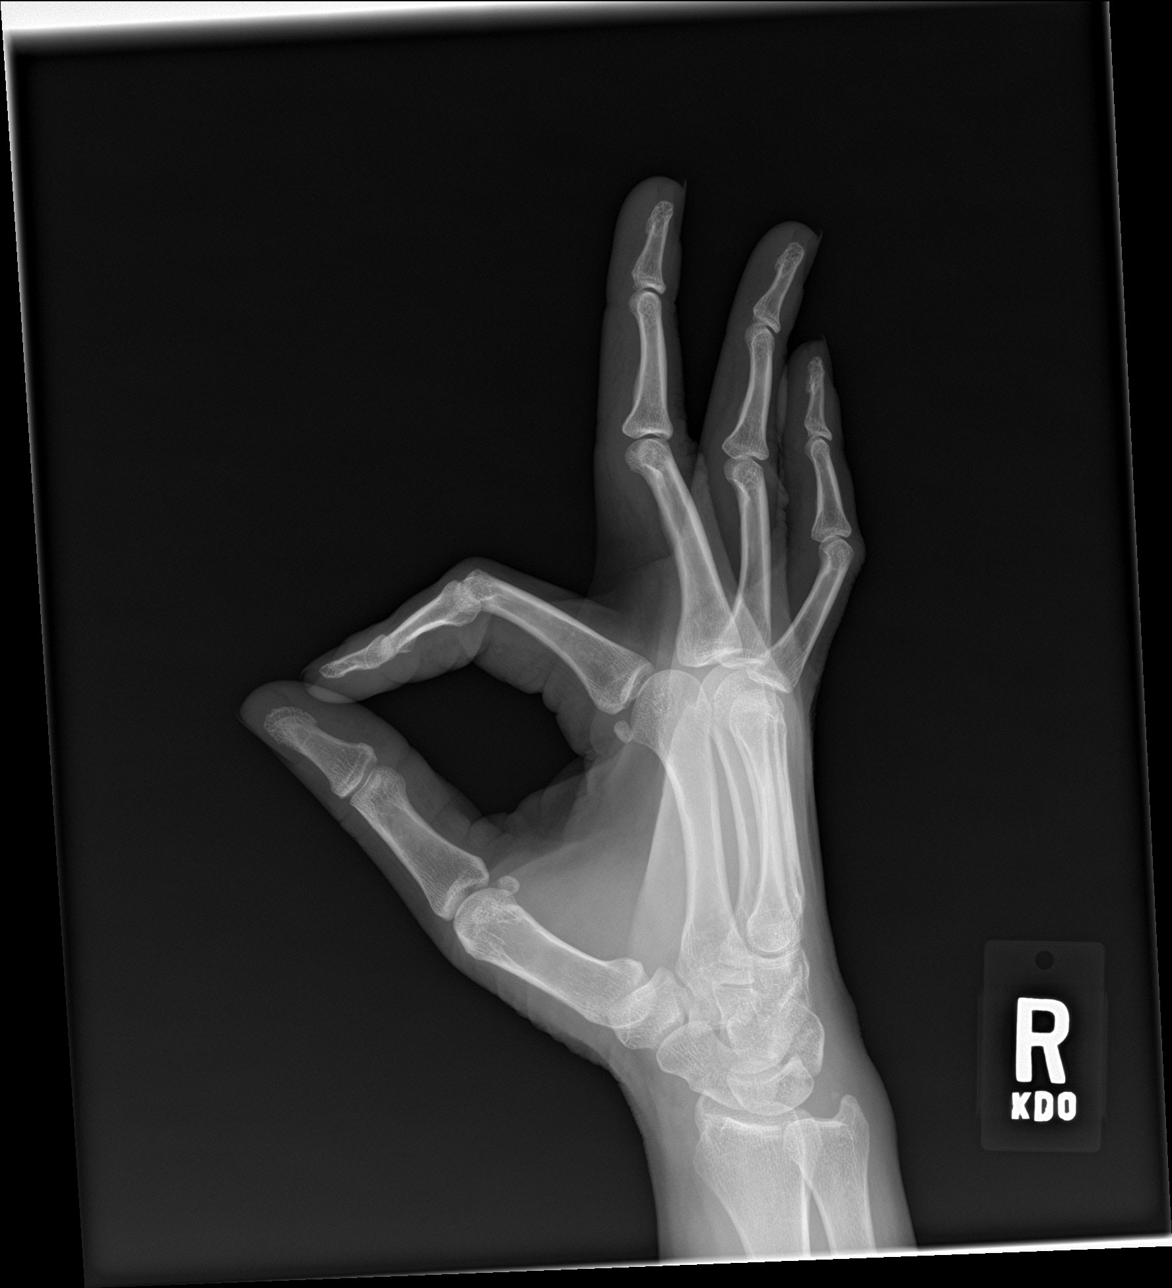

[hand lat]
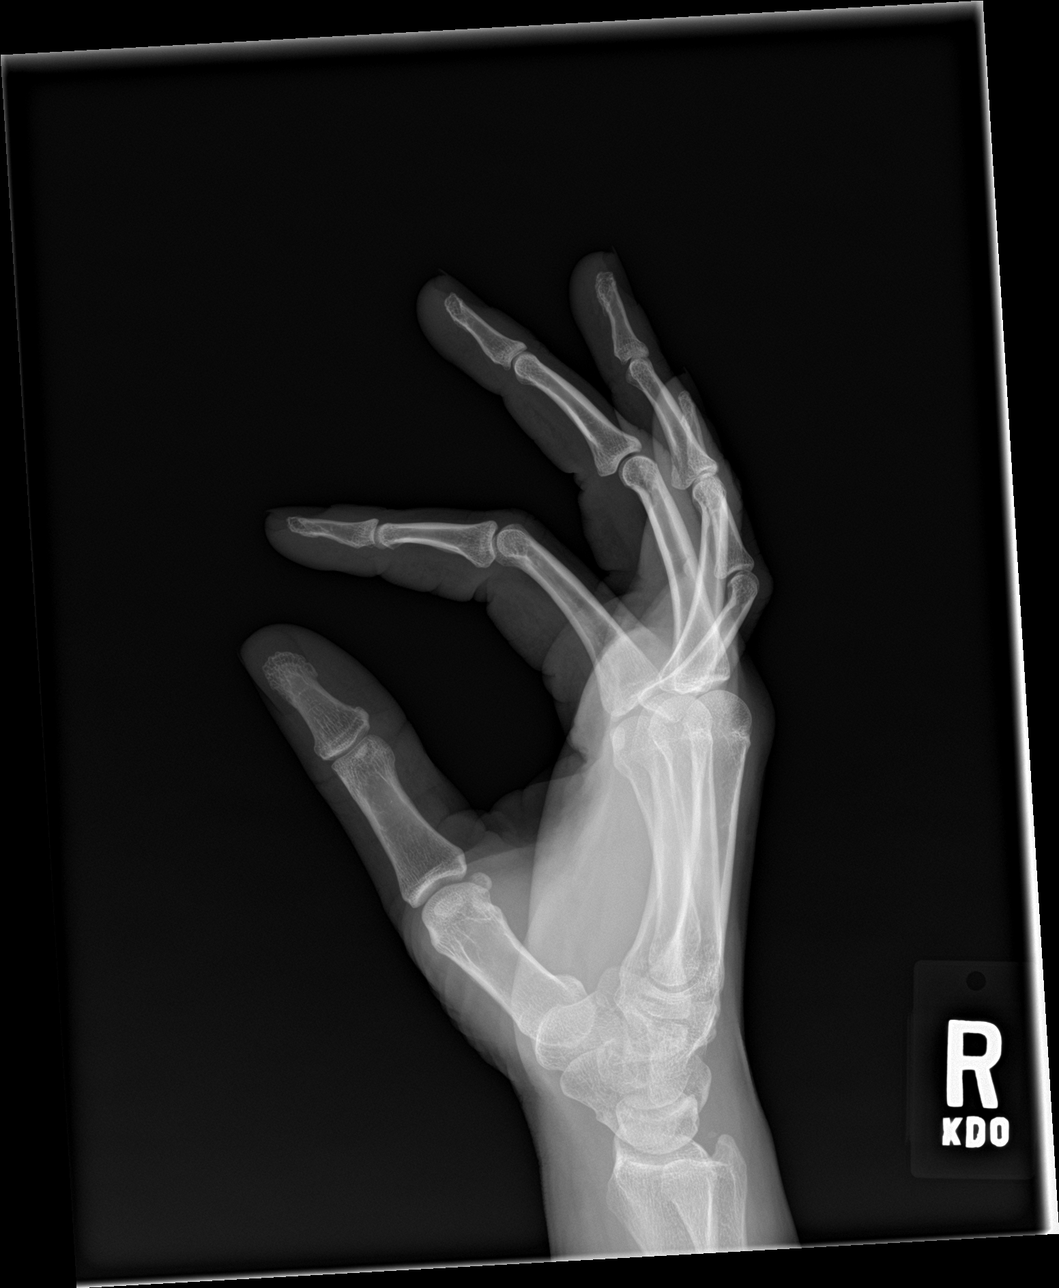

[3 of 3 positions shown; findings below may reference images not displayed]

FINDINGS: There is no evidence of fracture or dislocation. There is no
evidence of arthropathy or other focal bone abnormality. Soft
tissues are unremarkable.
IMPRESSION: Negative.

## 2019-11-08 DIAGNOSIS — N920 Excessive and frequent menstruation with regular cycle: Secondary | ICD-10-CM | POA: Diagnosis not present

## 2019-11-08 DIAGNOSIS — M069 Rheumatoid arthritis, unspecified: Secondary | ICD-10-CM | POA: Diagnosis not present

## 2019-11-08 DIAGNOSIS — D509 Iron deficiency anemia, unspecified: Secondary | ICD-10-CM | POA: Diagnosis not present

## 2019-11-08 DIAGNOSIS — C8409 Mycosis fungoides, extranodal and solid organ sites: Secondary | ICD-10-CM | POA: Diagnosis not present

## 2019-11-17 DIAGNOSIS — C8409 Mycosis fungoides, extranodal and solid organ sites: Secondary | ICD-10-CM | POA: Diagnosis not present

## 2019-11-17 DIAGNOSIS — M0579 Rheumatoid arthritis with rheumatoid factor of multiple sites without organ or systems involvement: Secondary | ICD-10-CM | POA: Diagnosis not present

## 2019-11-17 DIAGNOSIS — D509 Iron deficiency anemia, unspecified: Secondary | ICD-10-CM | POA: Diagnosis not present

## 2019-11-18 DIAGNOSIS — M0579 Rheumatoid arthritis with rheumatoid factor of multiple sites without organ or systems involvement: Secondary | ICD-10-CM | POA: Diagnosis not present

## 2019-11-18 DIAGNOSIS — D509 Iron deficiency anemia, unspecified: Secondary | ICD-10-CM | POA: Diagnosis not present

## 2019-11-18 DIAGNOSIS — C8409 Mycosis fungoides, extranodal and solid organ sites: Secondary | ICD-10-CM | POA: Diagnosis not present

## 2019-12-01 DIAGNOSIS — D509 Iron deficiency anemia, unspecified: Secondary | ICD-10-CM | POA: Diagnosis not present

## 2019-12-01 DIAGNOSIS — C8409 Mycosis fungoides, extranodal and solid organ sites: Secondary | ICD-10-CM | POA: Diagnosis not present

## 2019-12-01 DIAGNOSIS — M0579 Rheumatoid arthritis with rheumatoid factor of multiple sites without organ or systems involvement: Secondary | ICD-10-CM | POA: Diagnosis not present

## 2019-12-08 DIAGNOSIS — Z79899 Other long term (current) drug therapy: Secondary | ICD-10-CM | POA: Diagnosis not present

## 2019-12-08 DIAGNOSIS — C8400 Mycosis fungoides, unspecified site: Secondary | ICD-10-CM | POA: Diagnosis not present

## 2019-12-08 DIAGNOSIS — M059 Rheumatoid arthritis with rheumatoid factor, unspecified: Secondary | ICD-10-CM | POA: Diagnosis not present

## 2020-01-03 DIAGNOSIS — Z113 Encounter for screening for infections with a predominantly sexual mode of transmission: Secondary | ICD-10-CM | POA: Diagnosis not present

## 2020-01-03 DIAGNOSIS — N920 Excessive and frequent menstruation with regular cycle: Secondary | ICD-10-CM | POA: Diagnosis not present

## 2020-01-03 DIAGNOSIS — Z8269 Family history of other diseases of the musculoskeletal system and connective tissue: Secondary | ICD-10-CM | POA: Diagnosis not present

## 2020-01-03 DIAGNOSIS — Z118 Encounter for screening for other infectious and parasitic diseases: Secondary | ICD-10-CM | POA: Diagnosis not present

## 2020-01-03 DIAGNOSIS — B9689 Other specified bacterial agents as the cause of diseases classified elsewhere: Secondary | ICD-10-CM | POA: Diagnosis not present

## 2020-01-03 DIAGNOSIS — Z01411 Encounter for gynecological examination (general) (routine) with abnormal findings: Secondary | ICD-10-CM | POA: Diagnosis not present

## 2020-01-03 DIAGNOSIS — D509 Iron deficiency anemia, unspecified: Secondary | ICD-10-CM | POA: Diagnosis not present

## 2020-01-03 DIAGNOSIS — Z1151 Encounter for screening for human papillomavirus (HPV): Secondary | ICD-10-CM | POA: Diagnosis not present

## 2020-01-03 DIAGNOSIS — N946 Dysmenorrhea, unspecified: Secondary | ICD-10-CM | POA: Diagnosis not present

## 2020-01-03 DIAGNOSIS — Z124 Encounter for screening for malignant neoplasm of cervix: Secondary | ICD-10-CM | POA: Diagnosis not present

## 2020-01-03 DIAGNOSIS — N939 Abnormal uterine and vaginal bleeding, unspecified: Secondary | ICD-10-CM | POA: Diagnosis not present

## 2020-01-03 DIAGNOSIS — N760 Acute vaginitis: Secondary | ICD-10-CM | POA: Diagnosis not present

## 2020-02-19 IMAGING — US US ABDOMEN LIMITED
1 series · 14 of 25 positions shown · non-contrast
Comparison: None.

CLINICAL DATA: Elevated LFTs

EXAM:
ULTRASOUND ABDOMEN LIMITED RIGHT UPPER QUADRANT

[Series 1: us abdomen limited · 0.25mm/px · 14 of 46 slices shown]
[im 1/46]
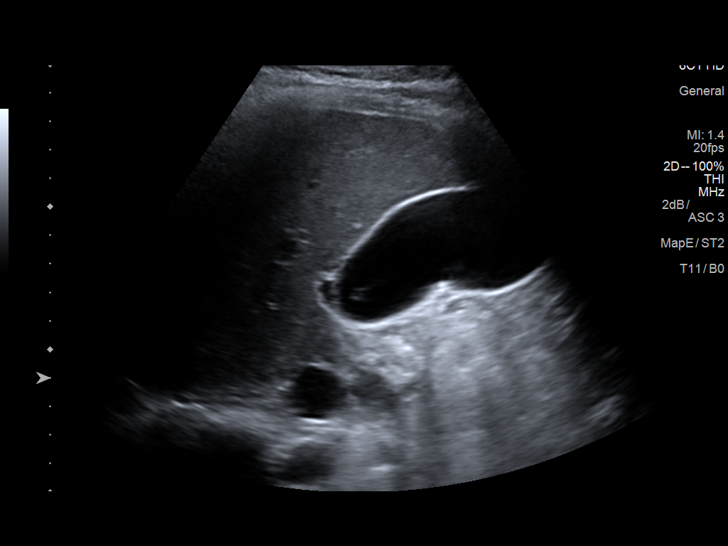
[im 4/46]
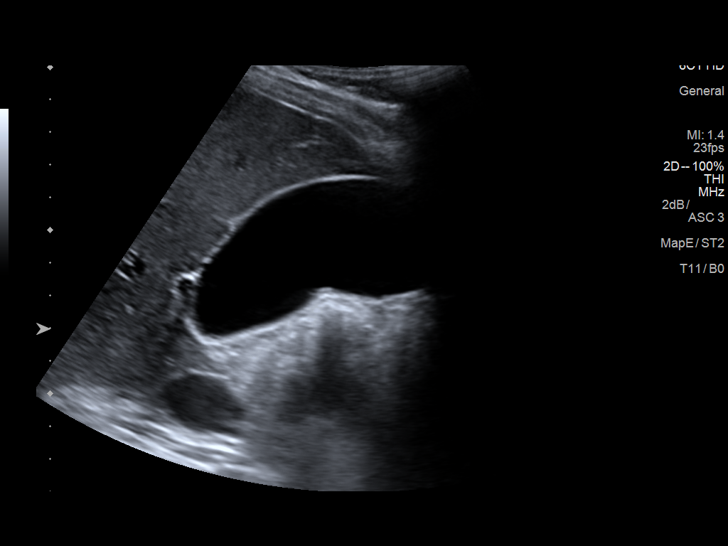
[im 8/46]
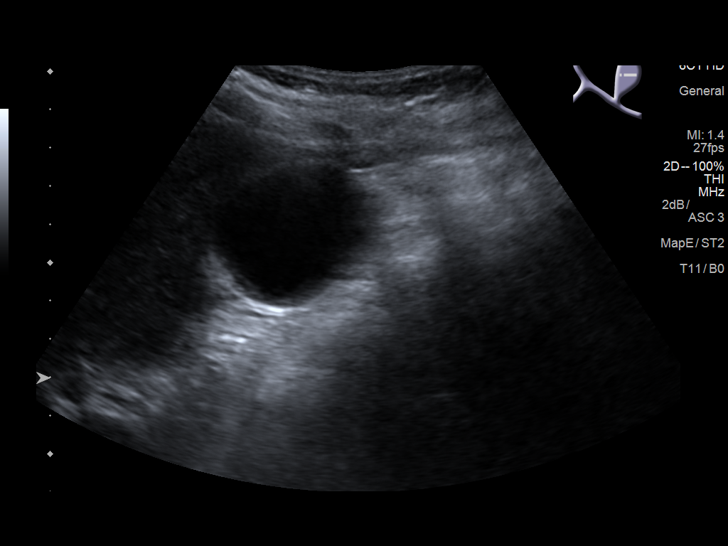
[im 12/46]
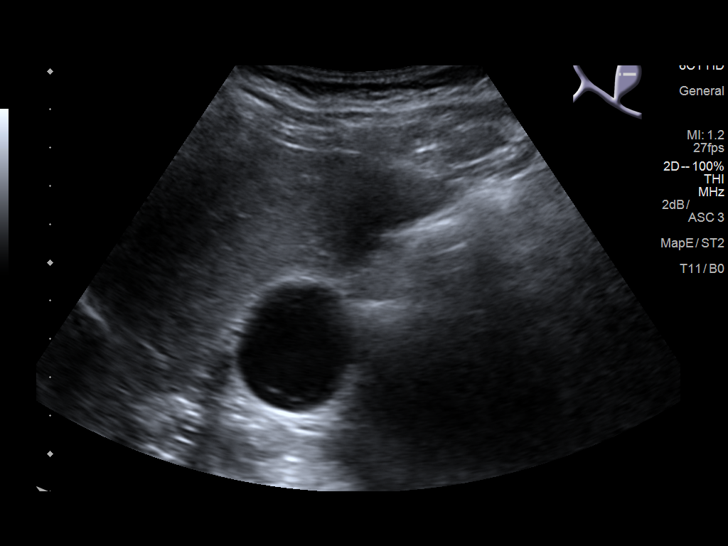
[im 16/46]
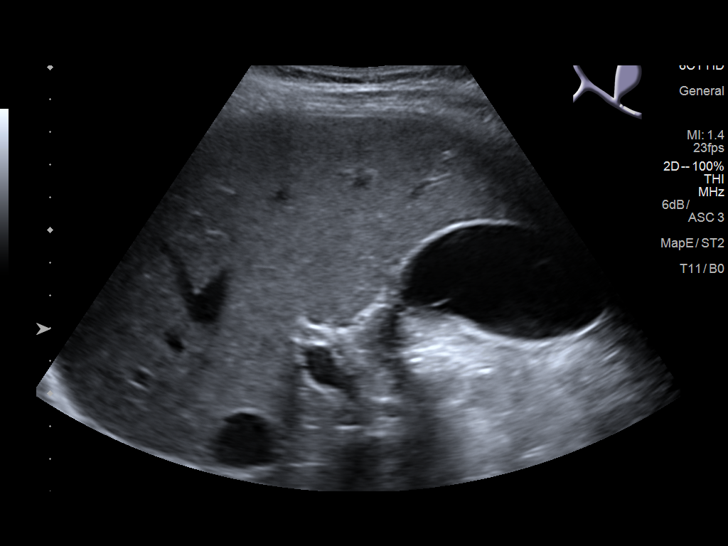
[im 17/46]
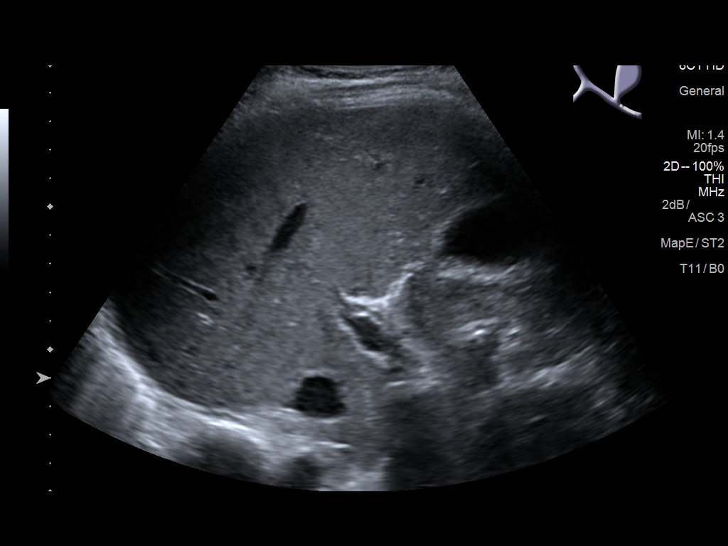
[im 21/46]
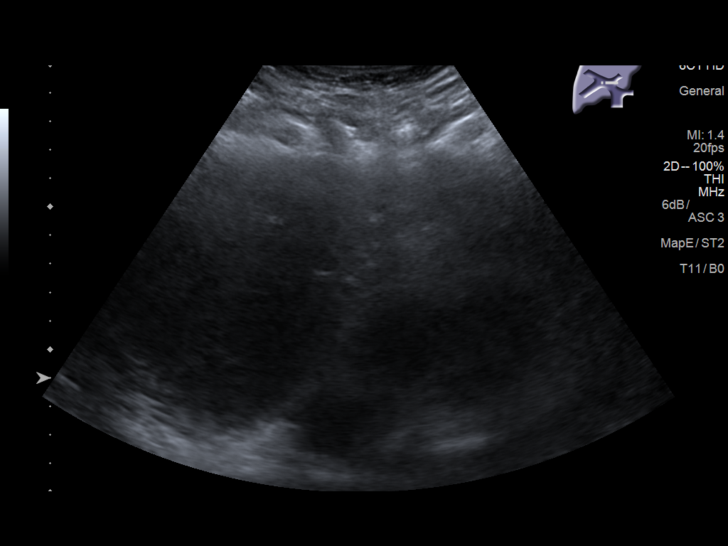
[im 25/46]
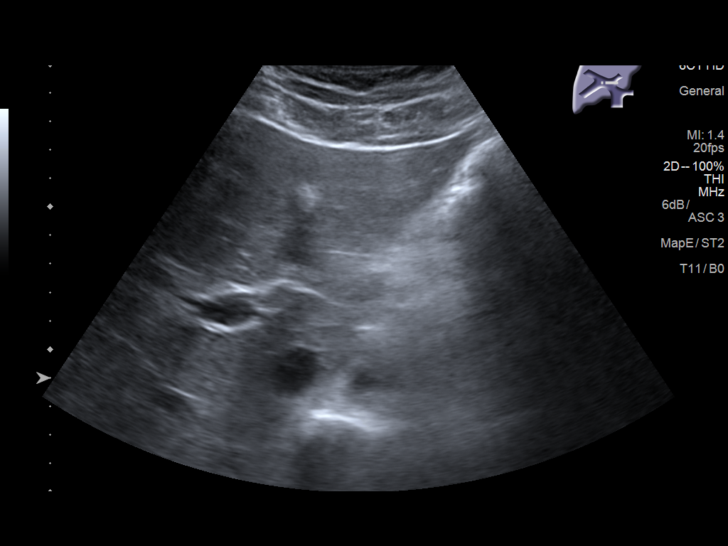
[im 29/46]
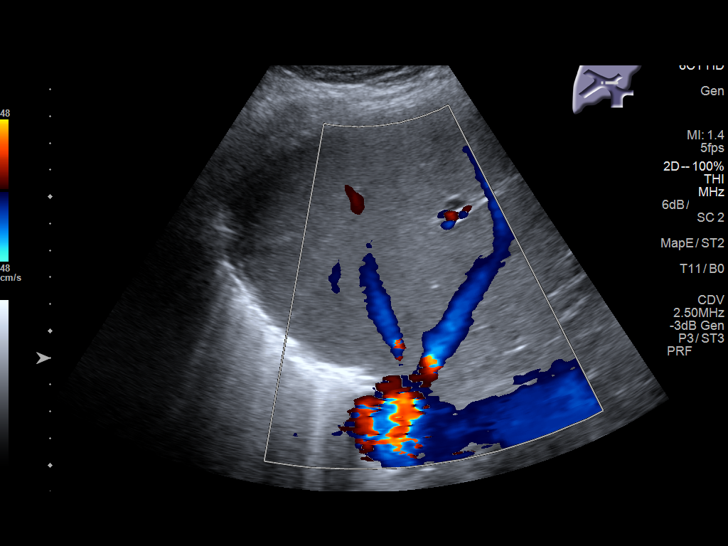
[im 31/46]
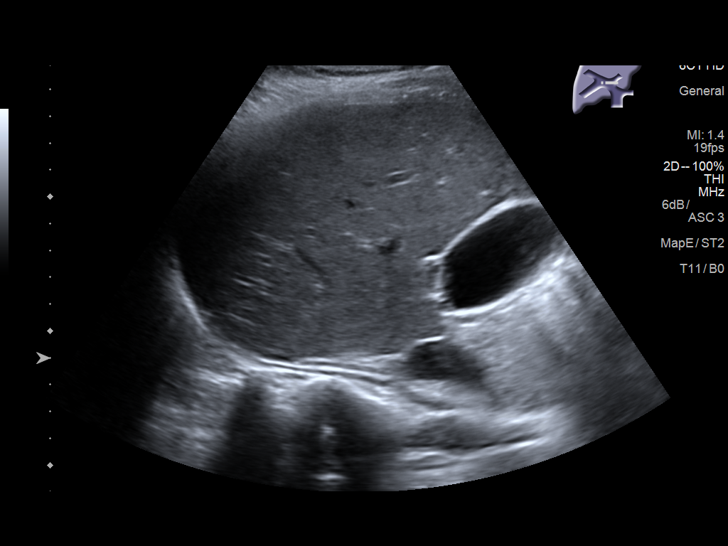
[im 34/46]
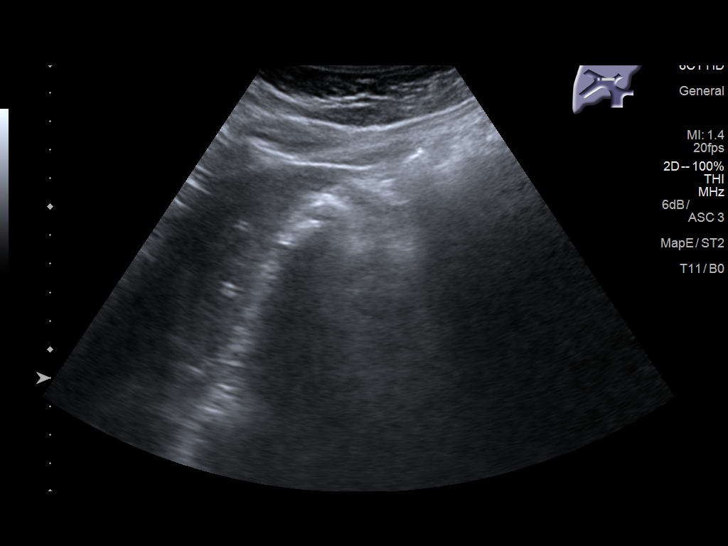
[im 38/46]
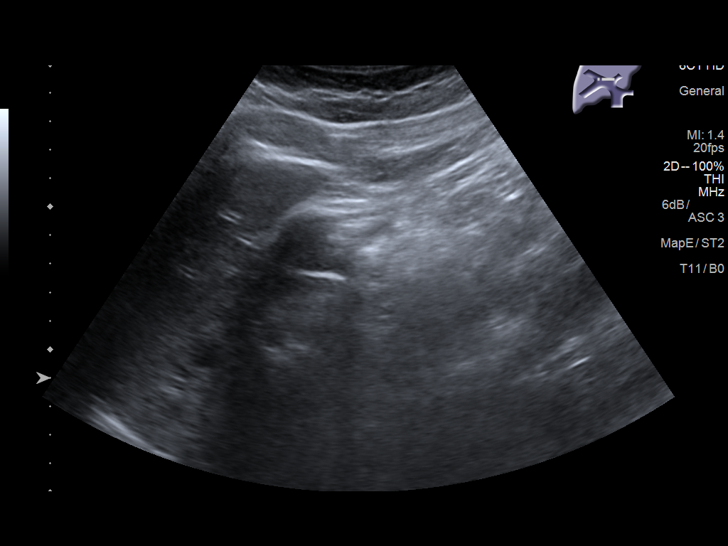
[im 42/46]
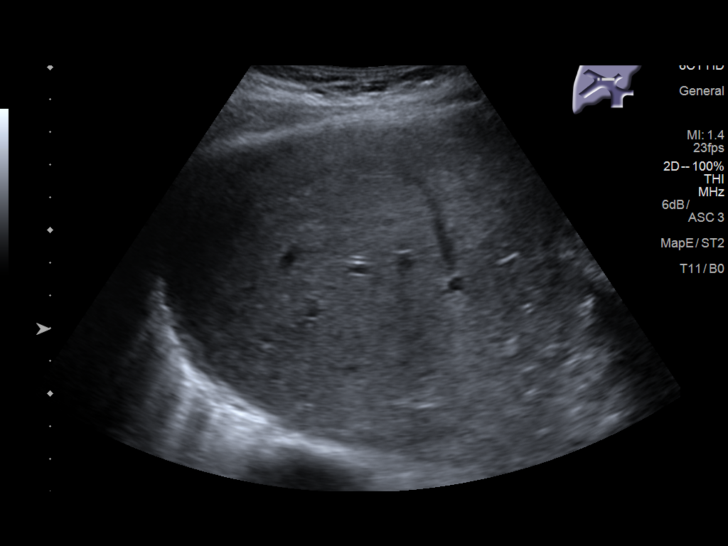
[im 46/46]
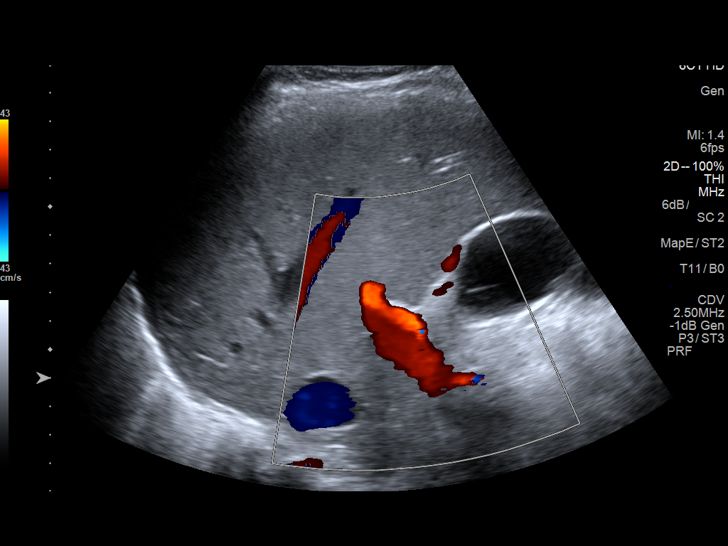

[14 of 25 positions shown; findings below may reference images not displayed]

FINDINGS: Gallbladder:

There is some sludge in the gallbladder with no stones, wall
thickening, or pericholecystic fluid. No Murphy's sign.

Common bile duct:

Diameter: 3.2 mm

Liver:

No focal lesion identified. Within normal limits in parenchymal
echogenicity. Portal vein is patent on color Doppler imaging with
normal direction of blood flow towards the liver.
IMPRESSION: Mild sludge in the gallbladder without wall thickening,
pericholecystic fluid, or Murphy's sign. No other abnormalities.

## 2020-03-17 ENCOUNTER — Other Ambulatory Visit (HOSPITAL_COMMUNITY): Payer: Self-pay | Admitting: Physician Assistant

## 2020-03-22 MED FILL — HYDROXYCHLOROQUINE 200 MG T: 200 | 90 days supply | Qty: 90 | Fill #0

## 2020-03-22 MED FILL — FOLIC ACID 1 MG TABS: 1 | 90 days supply | Qty: 90 | Fill #0

## 2020-03-24 DIAGNOSIS — M0579 Rheumatoid arthritis with rheumatoid factor of multiple sites without organ or systems involvement: Secondary | ICD-10-CM | POA: Diagnosis not present

## 2020-03-24 DIAGNOSIS — Z5111 Encounter for antineoplastic chemotherapy: Secondary | ICD-10-CM | POA: Diagnosis not present

## 2020-03-24 DIAGNOSIS — C8409 Mycosis fungoides, extranodal and solid organ sites: Secondary | ICD-10-CM | POA: Diagnosis not present

## 2020-03-24 DIAGNOSIS — Z79899 Other long term (current) drug therapy: Secondary | ICD-10-CM | POA: Diagnosis not present

## 2020-04-06 ENCOUNTER — Other Ambulatory Visit (HOSPITAL_COMMUNITY): Payer: Self-pay | Admitting: Physician Assistant

## 2020-04-06 DIAGNOSIS — Z79899 Other long term (current) drug therapy: Secondary | ICD-10-CM | POA: Diagnosis not present

## 2020-04-06 DIAGNOSIS — M059 Rheumatoid arthritis with rheumatoid factor, unspecified: Secondary | ICD-10-CM | POA: Diagnosis not present

## 2020-04-06 DIAGNOSIS — C8400 Mycosis fungoides, unspecified site: Secondary | ICD-10-CM | POA: Diagnosis not present

## 2020-04-07 DIAGNOSIS — M0579 Rheumatoid arthritis with rheumatoid factor of multiple sites without organ or systems involvement: Secondary | ICD-10-CM | POA: Diagnosis not present

## 2020-04-07 DIAGNOSIS — C8409 Mycosis fungoides, extranodal and solid organ sites: Secondary | ICD-10-CM | POA: Diagnosis not present

## 2020-04-07 DIAGNOSIS — Z79899 Other long term (current) drug therapy: Secondary | ICD-10-CM | POA: Diagnosis not present

## 2020-04-07 DIAGNOSIS — Z5111 Encounter for antineoplastic chemotherapy: Secondary | ICD-10-CM | POA: Diagnosis not present

## 2020-04-08 MED FILL — METHOTREXATE SODIUM (PF) 50: 50 | 28 days supply | Qty: 8 | Fill #0

## 2020-05-08 ENCOUNTER — Other Ambulatory Visit (HOSPITAL_COMMUNITY): Payer: Self-pay

## 2020-05-08 DIAGNOSIS — C8400 Mycosis fungoides, unspecified site: Secondary | ICD-10-CM | POA: Diagnosis not present

## 2020-05-08 DIAGNOSIS — Z79899 Other long term (current) drug therapy: Secondary | ICD-10-CM | POA: Diagnosis not present

## 2020-05-08 DIAGNOSIS — M069 Rheumatoid arthritis, unspecified: Secondary | ICD-10-CM | POA: Diagnosis not present

## 2020-05-08 DIAGNOSIS — D509 Iron deficiency anemia, unspecified: Secondary | ICD-10-CM | POA: Diagnosis not present

## 2020-05-08 MED ORDER — FERROUS SULFATE 325 (65 FE) MG PO TBEC: 1.0000 | DELAYED_RELEASE_TABLET | Freq: Every day | ORAL | 1 refills | Status: AC

## 2020-05-09 ENCOUNTER — Other Ambulatory Visit (HOSPITAL_COMMUNITY): Payer: Self-pay

## 2020-05-18 ENCOUNTER — Other Ambulatory Visit (HOSPITAL_COMMUNITY): Payer: Self-pay

## 2020-05-25 ENCOUNTER — Other Ambulatory Visit (HOSPITAL_COMMUNITY): Payer: Self-pay

## 2020-05-25 MED ORDER — METHOTREXATE SODIUM CHEMO INJECTION (PF) 50 MG/2ML
INTRAMUSCULAR | 1 refills | Status: DC
  Filled 2020-05-25: qty 10, 35d supply, fill #0

## 2020-05-26 ENCOUNTER — Other Ambulatory Visit (HOSPITAL_COMMUNITY): Payer: Self-pay

## 2020-05-29 ENCOUNTER — Other Ambulatory Visit (HOSPITAL_COMMUNITY): Payer: Self-pay

## 2020-06-07 ENCOUNTER — Other Ambulatory Visit (HOSPITAL_COMMUNITY): Payer: Self-pay

## 2020-06-07 MED ORDER — AMOXICILLIN 500 MG PO CAPS
500.0000 mg | ORAL_CAPSULE | Freq: Three times a day (TID) | ORAL | 0 refills | Status: DC
  Filled 2020-06-07: qty 21, 7d supply, fill #0

## 2020-06-08 ENCOUNTER — Other Ambulatory Visit (HOSPITAL_COMMUNITY): Payer: Self-pay

## 2020-06-10 ENCOUNTER — Other Ambulatory Visit: Payer: Self-pay

## 2020-06-10 ENCOUNTER — Encounter (HOSPITAL_COMMUNITY): Payer: Self-pay | Admitting: Emergency Medicine

## 2020-06-10 ENCOUNTER — Emergency Department (HOSPITAL_COMMUNITY)
Admission: EM | Admit: 2020-06-10 | Discharge: 2020-06-10 | Disposition: A | Discharge status: Home / Self Care | Payer: 59 | Attending: Emergency Medicine | Admitting: Emergency Medicine

## 2020-06-10 ENCOUNTER — Emergency Department (HOSPITAL_COMMUNITY): Payer: 59

## 2020-06-10 DIAGNOSIS — K0889 Other specified disorders of teeth and supporting structures: Secondary | ICD-10-CM | POA: Insufficient documentation

## 2020-06-10 DIAGNOSIS — R000 Tachycardia, unspecified: Secondary | ICD-10-CM | POA: Diagnosis not present

## 2020-06-10 DIAGNOSIS — R519 Headache, unspecified: Secondary | ICD-10-CM | POA: Insufficient documentation

## 2020-06-10 DIAGNOSIS — R110 Nausea: Secondary | ICD-10-CM | POA: Diagnosis present

## 2020-06-10 DIAGNOSIS — Z20822 Contact with and (suspected) exposure to covid-19: Secondary | ICD-10-CM | POA: Insufficient documentation

## 2020-06-10 DIAGNOSIS — R531 Weakness: Secondary | ICD-10-CM | POA: Insufficient documentation

## 2020-06-10 DIAGNOSIS — N390 Urinary tract infection, site not specified: Secondary | ICD-10-CM | POA: Insufficient documentation

## 2020-06-10 DIAGNOSIS — R079 Chest pain, unspecified: Secondary | ICD-10-CM | POA: Diagnosis not present

## 2020-06-10 LAB — RESP PANEL BY RT-PCR (FLU A&B, COVID) ARPGX2
Influenza A by PCR: NEGATIVE
Influenza B by PCR: NEGATIVE
SARS Coronavirus 2 by RT PCR: NEGATIVE

## 2020-06-10 LAB — COMPREHENSIVE METABOLIC PANEL
ALT: 14 U/L (ref 0–44)
AST: 18 U/L (ref 15–41)
Albumin: 4.6 g/dL (ref 3.5–5.0)
Alkaline Phosphatase: 57 U/L (ref 38–126)
Anion gap: 10 (ref 5–15)
BUN: 7 mg/dL (ref 6–20)
CO2: 24 mmol/L (ref 22–32)
Calcium: 9.4 mg/dL (ref 8.9–10.3)
Chloride: 103 mmol/L (ref 98–111)
Creatinine, Ser: 0.68 mg/dL (ref 0.44–1.00)
GFR, Estimated: >60 mL/min (ref >60)
Glucose, Bld: 108 mg/dL — ABNORMAL HIGH (ref 70–99)
Potassium: 3.9 mmol/L (ref 3.5–5.1)
Sodium: 137 mmol/L (ref 135–145)
Total Bilirubin: 0.4 mg/dL (ref 0.3–1.2)
Total Protein: 7.8 g/dL (ref 6.5–8.1)

## 2020-06-10 LAB — URINALYSIS, ROUTINE W REFLEX MICROSCOPIC
Bacteria, UA: NONE SEEN
Bilirubin Urine: NEGATIVE
Glucose, UA: NEGATIVE mg/dL
Ketones, ur: NEGATIVE mg/dL
Nitrite: NEGATIVE
Protein, ur: NEGATIVE mg/dL
Specific Gravity, Urine: 1.01 (ref 1.005–1.030)
pH: 7 (ref 5.0–8.0)

## 2020-06-10 LAB — PREGNANCY, URINE: Preg Test, Ur: NEGATIVE

## 2020-06-10 LAB — CBC WITH DIFFERENTIAL/PLATELET
Abs Immature Granulocytes: 0.03 10*3/uL (ref 0.00–0.07)
Basophils Absolute: 0 10*3/uL (ref 0.0–0.1)
Basophils Relative: 0 %
Eosinophils Absolute: 0.1 10*3/uL (ref 0.0–0.5)
Eosinophils Relative: 1 %
HCT: 40.5 % (ref 36.0–46.0)
Hemoglobin: 13.1 g/dL (ref 12.0–15.0)
Immature Granulocytes: 0 %
Lymphocytes Relative: 3 %
Lymphs Abs: 0.3 10*3/uL — ABNORMAL LOW (ref 0.7–4.0)
MCH: 28.7 pg (ref 26.0–34.0)
MCHC: 32.3 g/dL (ref 30.0–36.0)
MCV: 88.8 fL (ref 80.0–100.0)
Monocytes Absolute: 0.5 10*3/uL (ref 0.1–1.0)
Monocytes Relative: 5 %
Neutro Abs: 8.7 10*3/uL — ABNORMAL HIGH (ref 1.7–7.7)
Neutrophils Relative %: 91 %
Platelets: 362 10*3/uL (ref 150–400)
RBC: 4.56 MIL/uL (ref 3.87–5.11)
RDW: 14 % (ref 11.5–15.5)
WBC: 9.7 10*3/uL (ref 4.0–10.5)
nRBC: 0 % (ref 0.0–0.2)

## 2020-06-10 LAB — LACTIC ACID, PLASMA: Lactic Acid, Venous: 1.5 mmol/L (ref 0.5–1.9)

## 2020-06-10 LAB — TROPONIN I (HIGH SENSITIVITY)
Troponin I (High Sensitivity): <2 ng/L (ref <18)
Troponin I (High Sensitivity): <2 ng/L (ref <18)

## 2020-06-10 MED ORDER — CEPHALEXIN 500 MG PO CAPS: 500.0000 mg | ORAL_CAPSULE | Freq: Three times a day (TID) | ORAL | 0 refills | Status: AC

## 2020-06-10 MED ORDER — ACETAMINOPHEN 500 MG PO TABS
1000.0000 mg | ORAL_TABLET | Freq: Once | ORAL | Status: AC
  Administered 2020-06-10: 1000 mg via ORAL
  Filled 2020-06-10: qty 2

## 2020-06-10 MED ORDER — LACTATED RINGERS IV BOLUS
1000.0000 mL | Freq: Once | INTRAVENOUS | Status: AC
  Administered 2020-06-10: 1000 mL via INTRAVENOUS

## 2020-06-10 MED ORDER — SODIUM CHLORIDE 0.9 % IV BOLUS
1000.0000 mL | Freq: Once | INTRAVENOUS | Status: AC
  Administered 2020-06-10: 1000 mL via INTRAVENOUS

## 2020-06-10 MED ORDER — SODIUM CHLORIDE 0.9 % IV SOLN
1.0000 g | Freq: Once | INTRAVENOUS | Status: AC
  Administered 2020-06-10: 1 g via INTRAVENOUS
  Filled 2020-06-10: qty 10

## 2020-06-10 NOTE — ED Triage Notes (Signed)
Patient complaining of weakness, chills, and dizziness. Patient states it started around 2330.

## 2020-06-10 NOTE — ED Provider Notes (Signed)
[facility] DEPT Provider Note   CSN: [id number] Arrival date & time: 06/10/20  0408     History Chief Complaint  Patient presents with  . Weakness    Erin Clay is a 40 y.o. female.  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 She was having dental pain 3 days ago, saw dentist who prescribed amox. She started antibiotics yesterday. She suddenly began having nausea, bloating, dizziness, chills, weakness, and mild headache after taking a dose of amox at 11pm. However, she's taken this medication previously without problems. She felt like this in the past when she was diagnosed with granulomatous mycosis fungoides, however she currently doesn't have lymphadenopathy. She reports nasal congestion symptoms which she often has with seasonal allergies. No fever, cough, chest pain, SOB, sore throat, rhinorrhea, abd pain, vomiting, or diarrhea. No sick contacts.   The history is provided by the patient.  Weakness      Past Medical History:  Diagnosis Date  . RA (rheumatoid arthritis) [facility])     Patient Active Problem List   Diagnosis Date Noted  . Rheumatoid arthritis ([place]) 03/13/2018  . Lymphadenopathy, axillary 03/13/2018  . FUO (fever of unknown origin) 03/13/2018  . Normocytic anemia 03/13/2018  . Elevated liver enzymes 03/13/2018    Past Surgical History:  Procedure Laterality Date  . AXILLARY LYMPH NODE BIOPSY Right 03/13/2018   Procedure: AXILLARY LYMPH NODE BIOPSY;  Surgeon: Johnathan Hausen, MD;  Location: [facility] ORS;  Service: General;  Laterality: Right;     OB History   No obstetric history on file.     History reviewed. No pertinent family history.  Social History   Tobacco Use  . Smoking status: Never Smoker  . Smokeless tobacco: Never Used  Vaping Use  . Vaping Use: Never used  Substance Use Topics  . Alcohol use: No  . Drug use: No    Home  Medications Prior to Admission medications   Medication Sig Start Date End Date Taking? Authorizing Provider  acetaminophen (TYLENOL) 500 MG tablet Take 1,000 mg by mouth every 6 (six) hours as needed for mild pain or headache.   Yes [provider]  amoxicillin (AMOXIL) 500 MG capsule Take 1 capsule (500 mg total) by mouth 3 (three) times daily until gone. 06/06/20  Yes   cephALEXin (KEFLEX) 500 MG capsule Take 1 capsule (500 mg total) by mouth 3 (three) times daily for 10 days. 06/10/20 06/20/20 Yes Diondra Pines, Wenda Overland, MD  ferrous sulfate 325 (65 FE) MG EC tablet Take 1 tablet by mouth once a day Patient taking differently: Take 325 mg by mouth daily with breakfast. 4/40/10  Yes   folic acid (FOLVITE) 1 MG tablet TAKE 1 TABLET BY MOUTH DAILY Patient taking differently: Take 1 mg by mouth daily. 03/17/20 03/17/21 Yes Thapa, Rupak, MD  hydroxychloroquine (PLAQUENIL) 200 MG tablet TAKE 1 TABLET BY MOUTH ONCE A DAY Patient taking differently: Take 200 mg by mouth daily. 04/06/20 04/06/21 Yes Sampson Si, PA  Methotrexate Sodium (METHOTREXATE, PF,) 50 MG/2ML injection INJECT 0.8 MLS (20 MG TOTAL) INTO THE SKIN ONCE A WEEK. Patient taking differently: Inject 20 mg into the muscle once a week. 04/06/20 04/06/21 Yes Swanson, York Cerise, PA  hydroxychloroquine (PLAQUENIL) 200 MG tablet TAKE 1 TABLET BY MOUTH ONCE A DAY Patient not taking: No sig reported 03/17/20 03/17/21  Sampson Si, PA  meclizine (ANTIVERT) 32 MG tablet Take 1 tablet (32 mg total) by mouth 3 (  three) times daily as needed. Patient not taking: No sig reported 04/13/16   Providence Lanius A, PA-C  Methotrexate Sodium (METHOTREXATE, PF,) 50 MG/2ML injection Inject 0.8 mls into the skin once a week.  Discard vial after single use. Patient not taking: No sig reported 05/25/20     naproxen (NAPROSYN) 500 MG tablet Take 1 tablet (500 mg total) by mouth 2 (two) times daily. Patient not taking: No sig reported 04/26/18   Kinnie Feil, PA-C     Allergies    Sulfasalazine  Review of Systems   Review of Systems  Neurological: Positive for weakness.   All other systems reviewed and are negative except that which was mentioned in HPI  Physical Exam Updated Vital Signs BP (!) 108/59   Pulse (!) 103   Temp 99.5 F (37.5 C) (Oral)   Resp (!) 25   LMP 05/11/2020 (Within Weeks)   SpO2 100%   Physical Exam Vitals and nursing note reviewed.  Constitutional:      General: She is not in acute distress.    Appearance: She is well-developed.     Comments: Awake, alert  HENT:     Head: Normocephalic and atraumatic.  Eyes:     Extraocular Movements: Extraocular movements intact.     Pupils: Pupils are equal, round, and reactive to light.     Comments: B/l conjunctival injection  Cardiovascular:     Rate and Rhythm: Regular rhythm. Tachycardia present.     Heart sounds: Normal heart sounds. No murmur heard.   Pulmonary:     Effort: Pulmonary effort is normal. No respiratory distress.     Breath sounds: Normal breath sounds.  Abdominal:     General: Bowel sounds are normal. There is no distension.     Palpations: Abdomen is soft.     Tenderness: There is no abdominal tenderness.  Musculoskeletal:     Cervical back: Neck supple.     Right lower leg: No edema.     Left lower leg: No edema.  Skin:    General: Skin is warm and dry.  Neurological:     Mental Status: She is alert and oriented to person, place, and time.     Cranial Nerves: No cranial nerve deficit.     Motor: No abnormal muscle tone.     Deep Tendon Reflexes: Reflexes are normal and symmetric.     Comments: Fluent speech,  no clonus 5/5 strength and normal sensation x all 4 extremities  Psychiatric:        Mood and Affect: Mood normal.        Thought Content: Thought content normal.        Judgment: Judgment normal.     ED Results / Procedures / Treatments   Labs (all labs ordered are listed, but only abnormal results are displayed) Labs  Reviewed  CBC WITH DIFFERENTIAL/PLATELET - Abnormal; Notable for the following components:      Result Value   Neutro Abs 8.7 (*)    Lymphs Abs 0.3 (*)    All other components within normal limits  COMPREHENSIVE METABOLIC PANEL - Abnormal; Notable for the following components:   Glucose, Bld 108 (*)    All other components within normal limits  URINALYSIS, ROUTINE W REFLEX MICROSCOPIC - Abnormal; Notable for the following components:   Hgb urine dipstick SMALL (*)    Leukocytes,Ua LARGE (*)    All other components within normal limits  RESP PANEL BY RT-PCR (FLU A&B, COVID) ARPGX2  URINE CULTURE  LACTIC ACID, PLASMA  PREGNANCY, URINE  TROPONIN I (HIGH SENSITIVITY)  TROPONIN I (HIGH SENSITIVITY)    EKG EKG Interpretation  Date/Time:  Saturday Jun 10 2020 08:24:09 EDT Ventricular Rate:  116 PR Interval:  151 QRS Duration: 72 QT Interval:  306 QTC Calculation: 425 R Axis:   66 Text Interpretation: Sinus tachycardia similar to previous Confirmed by Theotis Burrow [phone]) on 06/10/2020 8:30:28 AM   Radiology DG Chest 2 View  Result Date: 06/10/2020 CLINICAL DATA:  Chest pain EXAM: CHEST - 2 VIEW COMPARISON:  03/12/2018 FINDINGS: The heart size and mediastinal contours are within normal limits. Both lungs are clear. The visualized skeletal structures are unremarkable. IMPRESSION: No active cardiopulmonary disease. Electronically Signed   By: Kerby Moors M.D.   On: 06/10/2020 05:39    Procedures Procedures   Medications Ordered in ED Medications  sodium chloride 0.9 % bolus 1,000 mL (0 mLs Intravenous Stopped 06/10/20 1207)  acetaminophen (TYLENOL) tablet 1,000 mg (1,000 mg Oral Given 06/10/20 0847)  lactated ringers bolus 1,000 mL (0 mLs Intravenous Stopped 06/10/20 1230)  cefTRIAXone (ROCEPHIN) 1 g in sodium chloride 0.9 % 100 mL IVPB (0 g Intravenous Stopped 06/10/20 1157)    ED Course  I have reviewed the triage vital signs and the nursing notes.  Pertinent labs &  imaging results that were available during my care of the patient were reviewed by me and considered in my medical decision making (see chart for details).    MDM Rules/Calculators/A&P                          Nontoxic on exam, mildly tachycardic.  Temp 99.8 orally, I suspect she is developing a mild fever.  No focal signs of infection on exam and no complaints of abdominal pain or URI symptoms.  COVID/influenza negative.  Chest x-ray clear.  Lab work shows normal CBC and CMP, normal lactate, trops negative. UA c/w infection. PT does report she's had dysuria and frequency for the past day.  Gave the patient ceftriaxone here and ordered urine culture.  I suspect urine is the source of her symptoms.  Because of her immunocompromise status, will treat for pyelonephritis with 10-day course of Keflex.  She is well-appearing on reassessment and states that she feels much better after above medications.  Lab work reassuring against sepsis.  She feels comfortable treating her symptoms at home and voiced understanding of reasons to return to the ED. Final Clinical Impression(s) / ED Diagnoses Final diagnoses:  Urinary tract infection without hematuria, site unspecified    Rx / DC Orders ED Discharge Orders         Ordered    cephALEXin (KEFLEX) 500 MG capsule  3 times daily        06/10/20 1212           Jyden Kromer, Wenda Overland, MD 06/10/20 1544

## 2020-06-11 LAB — URINE CULTURE: Culture: NO GROWTH

## 2020-06-12 ENCOUNTER — Other Ambulatory Visit (HOSPITAL_COMMUNITY): Payer: Self-pay

## 2020-06-12 MED ORDER — HYDROCORTISONE 2.5 % EX CREA
TOPICAL_CREAM | CUTANEOUS | 2 refills | Status: AC
  Filled 2020-06-12: qty 45, 20d supply, fill #0

## 2020-06-12 MED ORDER — HYDROXYZINE HCL 25 MG PO TABS
25.0000 mg | ORAL_TABLET | Freq: Three times a day (TID) | ORAL | 0 refills | Status: DC | PRN
  Filled 2020-06-12: qty 30, 10d supply, fill #0

## 2020-06-13 ENCOUNTER — Other Ambulatory Visit (HOSPITAL_COMMUNITY): Payer: Self-pay

## 2020-06-13 MED ORDER — TRIAMCINOLONE ACETONIDE 0.1 % EX OINT
TOPICAL_OINTMENT | CUTANEOUS | 3 refills | Status: AC
  Filled 2020-06-13: qty 454, 30d supply, fill #0
  Filled 2021-05-17: qty 454, 30d supply, fill #1

## 2020-06-14 ENCOUNTER — Ambulatory Visit (HOSPITAL_COMMUNITY)
Admission: EM | Admit: 2020-06-14 | Discharge: 2020-06-14 | Disposition: A | Discharge status: Home / Self Care | Payer: 59 | Attending: Internal Medicine | Admitting: Internal Medicine

## 2020-06-14 ENCOUNTER — Other Ambulatory Visit (HOSPITAL_COMMUNITY): Payer: Self-pay

## 2020-06-14 ENCOUNTER — Encounter (HOSPITAL_COMMUNITY): Payer: Self-pay

## 2020-06-14 ENCOUNTER — Other Ambulatory Visit: Payer: Self-pay

## 2020-06-14 DIAGNOSIS — L299 Pruritus, unspecified: Secondary | ICD-10-CM | POA: Diagnosis not present

## 2020-06-14 DIAGNOSIS — T378X5A Adverse effect of other specified systemic anti-infectives and antiparasitics, initial encounter: Secondary | ICD-10-CM

## 2020-06-14 MED ORDER — PREDNISONE 20 MG PO TABS
40.0000 mg | ORAL_TABLET | Freq: Every day | ORAL | 0 refills | Status: AC
  Filled 2020-06-14: qty 10, 5d supply, fill #0

## 2020-06-14 MED ORDER — HYDROXYZINE HCL 50 MG PO TABS
50.0000 mg | ORAL_TABLET | Freq: Three times a day (TID) | ORAL | 0 refills | Status: AC | PRN
  Filled 2020-06-14: qty 30, 10d supply, fill #0

## 2020-06-14 MED ORDER — METHYLPREDNISOLONE SODIUM SUCC 125 MG IJ SOLR
INTRAMUSCULAR | Status: AC
  Filled 2020-06-14: qty 2

## 2020-06-14 MED ORDER — METHYLPREDNISOLONE SODIUM SUCC 125 MG IJ SOLR
60.0000 mg | Freq: Once | INTRAMUSCULAR | Status: AC
  Administered 2020-06-14: 60 mg via INTRAMUSCULAR

## 2020-06-14 NOTE — ED Provider Notes (Signed)
[facility]    CSN: [id number] Arrival date & time: 06/14/20  1155      History   Chief Complaint Chief Complaint  Patient presents with  . Allergic Reaction    HPI Erin Clay is a 40 y.o. female comes to the urgent care with a 2-day history of generalized itching.  Patient was started on amoxicillin about 7 days ago for dental pain.  Patient developed itching and generalized rash after that.  Antibiotics was switched to cephalexin with no improvement in her symptoms.  Last dose of cephalexin was yesterday.  Patient has tried over-the-counter remedies ranging from Benadryl, hydroxyzine and hydrocortisone cream with no improvement in her symptoms.  She comes to the urgent care to be reevaluated.  Patient has allergy to chloroquine.  She develops generalized itching after taking chloroquine.  Patient is currently on hydroxychloroquine over the past couple of years. HPI  Past Medical History:  Diagnosis Date  . RA (rheumatoid arthritis) [facility])     Patient Active Problem List   Diagnosis Date Noted  . Rheumatoid arthritis ([place]) 03/13/2018  . Lymphadenopathy, axillary 03/13/2018  . FUO (fever of unknown origin) 03/13/2018  . Normocytic anemia 03/13/2018  . Elevated liver enzymes 03/13/2018    Past Surgical History:  Procedure Laterality Date  . AXILLARY LYMPH NODE BIOPSY Right 03/13/2018   Procedure: AXILLARY LYMPH NODE BIOPSY;  Surgeon: Johnathan Hausen, MD;  Location: [facility];  Service: General;  Laterality: Right;    OB History   No obstetric history on file.      Home Medications    Prior to Admission medications   Medication Sig Start Date End Date Taking? Authorizing Provider  hydrOXYzine (ATARAX/VISTARIL) 50 MG tablet Take 1 tablet (50 mg total) by mouth every 8 (eight) hours as needed for itching. 06/14/20  Yes Jerrell Hart, Myrene Galas, MD  predniSONE (DELTASONE) 20 MG tablet Take 2 tablets (40 mg total) by mouth daily for 5 days. 06/14/20 06/19/20 Yes Bertis Hustead,  Myrene Galas, MD  acetaminophen (TYLENOL) 500 MG tablet Take 1,000 mg by mouth every 6 (six) hours as needed for mild pain or headache.    [provider]  cephALEXin (KEFLEX) 500 MG capsule Take 1 capsule (500 mg total) by mouth 3 (three) times daily for 10 days. 06/10/20 06/20/20  Little, Wenda Overland, MD  ferrous sulfate 325 (65 FE) MG EC tablet Take 1 tablet by mouth once a day Patient taking differently: Take 325 mg by mouth daily with breakfast. 7/59/16     folic acid (FOLVITE) 1 MG tablet TAKE 1 TABLET BY MOUTH DAILY Patient taking differently: Take 1 mg by mouth daily. 03/17/20 03/17/21  Wendall Mola, MD  hydrocortisone 2.5 % cream Apply as needed for itching to face twice daily. 06/12/20     hydroxychloroquine (PLAQUENIL) 200 MG tablet TAKE 1 TABLET BY MOUTH ONCE A DAY Patient taking differently: Take 200 mg by mouth daily. 04/06/20 04/06/21  Sampson Si, PA  Methotrexate Sodium (METHOTREXATE, PF,) 50 MG/2ML injection INJECT 0.8 MLS (20 MG TOTAL) INTO THE SKIN ONCE A WEEK. Patient taking differently: Inject 20 mg into the muscle once a week. 04/06/20 04/06/21  Sampson Si, PA  triamcinolone ointment (KENALOG) 0.1 % Apply to body once daily for itching 06/13/20       Family History Family History  Family history unknown: Yes    Social History Social History   Tobacco Use  . Smoking status: Never Smoker  . Smokeless tobacco: Never Used  Vaping Use  .  Vaping Use: Never used  Substance Use Topics  . Alcohol use: No  . Drug use: No     Allergies   Sulfasalazine   Review of Systems Review of Systems  HENT: Negative.  Negative for ear discharge and facial swelling.   Respiratory: Negative.  Negative for cough, choking, chest tightness, shortness of breath and wheezing.   Musculoskeletal: Negative.   Skin: Positive for rash. Negative for color change and wound.  Neurological: Negative.      Physical Exam Triage Vital Signs ED Triage Vitals  Enc Vitals Group      BP 06/14/20 1215 (!) 163/92     Pulse Rate 06/14/20 1215 (!) 111     Resp 06/14/20 1215 20     Temp 06/14/20 1215 98.4 F (36.9 C)     Temp Source 06/14/20 1215 Oral     SpO2 06/14/20 1215 100 %     Weight --      Height --      Head Circumference --      Peak Flow --      Pain Score 06/14/20 1216 0     Pain Loc --      Pain Edu? --      Excl. in [place]? --    No data found.  Updated Vital Signs BP (!) 163/92 (BP Location: Right Arm)   Pulse (!) 111   Temp 98.4 F (36.9 C) (Oral)   Resp 20   LMP  (LMP Unknown)   SpO2 100%   Visual Acuity Right Eye Distance:   Left Eye Distance:   Bilateral Distance:    Right Eye Near:   Left Eye Near:    Bilateral Near:     Physical Exam Vitals and nursing note reviewed.  Constitutional:      General: She is not in acute distress.    Appearance: She is not ill-appearing.  Cardiovascular:     Rate and Rhythm: Normal rate and regular rhythm.  Pulmonary:     Effort: Pulmonary effort is normal.     Breath sounds: Normal breath sounds.  Abdominal:     General: Bowel sounds are normal.     Palpations: Abdomen is soft.  Musculoskeletal:        General: Normal range of motion.  Skin:    General: Skin is warm and dry.  Neurological:     Mental Status: She is alert.      UC Treatments / Results  Labs (all labs ordered are listed, but only abnormal results are displayed) Labs Reviewed - No data to display  EKG   Radiology No results found.  Procedures Procedures (including critical care time)  Medications Ordered in UC Medications  methylPREDNISolone sodium succinate (SOLU-MEDROL) 125 mg/2 mL injection 60 mg (60 mg Intramuscular Given 06/14/20 1233)    Initial Impression / Assessment and Plan / UC Course  I have reviewed the triage vital signs and the nursing notes.  Pertinent labs & imaging results that were available during my care of the patient were reviewed by me and considered in my medical decision making (see  chart for details).     1.  Generalized pruritus (suspected adverse effect of hydroxychloroquine): Solu-Medrol 60 mg IM x1 dose Prednisone 40 mg orally daily for 5 days Hydroxyzine 50 mg every 8 hours as needed for itching Patient is advised to hold off taking hydroxychloroquine and discuss alternative medications options with the rheumatologist. Return to urgent care to be reevaluated  Final Clinical  Impressions(s) / UC Diagnoses   Final diagnoses:  Generalized pruritus  Adverse effect of hydroxychloroquine, initial encounter     Discharge Instructions     Please take medications as directed Stop taking hydroxychloroquine and follow-up with your rheumatologist for further advice Make sure you tell the rheumatologist that you have had similar reaction to chloroquine in the past.   ED Prescriptions    Medication Sig Dispense Auth. Provider   predniSONE (DELTASONE) 20 MG tablet Take 2 tablets (40 mg total) by mouth daily for 5 days. 10 tablet Neira Bentsen, Myrene Galas, MD   hydrOXYzine (ATARAX/VISTARIL) 50 MG tablet Take 1 tablet (50 mg total) by mouth every 8 (eight) hours as needed for itching. 30 tablet Dillinger Aston, Myrene Galas, MD     PDMP not reviewed this encounter.   Chase Picket, MD 06/14/20 [phone]

## 2020-06-14 NOTE — Discharge Instructions (Signed)
Please take medications as directed Stop taking hydroxychloroquine and follow-up with your rheumatologist for further advice Make sure you tell the rheumatologist that you have had similar reaction to chloroquine in the past.

## 2020-06-14 NOTE — ED Triage Notes (Signed)
Pt presents with severe itching all over body ; pt states she has taken benadryl, hydroxyzine and hydrocortisone with no relief X 2 days.

## 2020-07-04 ENCOUNTER — Other Ambulatory Visit (HOSPITAL_COMMUNITY): Payer: Self-pay

## 2020-07-04 MED ORDER — METHOTREXATE SODIUM CHEMO INJECTION (PF) 50 MG/2ML
INTRAMUSCULAR | 1 refills | Status: AC
  Filled 2020-07-04: qty 10, 35d supply, fill #0
  Filled 2020-08-01: qty 10, 35d supply, fill #1

## 2020-07-05 ENCOUNTER — Other Ambulatory Visit (HOSPITAL_COMMUNITY): Payer: Self-pay

## 2020-07-19 ENCOUNTER — Other Ambulatory Visit (HOSPITAL_COMMUNITY): Payer: Self-pay

## 2020-07-19 MED ORDER — HYDROXYCHLOROQUINE SULFATE 200 MG PO TABS
200.0000 mg | ORAL_TABLET | Freq: Every day | ORAL | 1 refills | Status: AC
  Filled 2020-07-19: qty 90, 90d supply, fill #0
  Filled 2020-10-23: qty 90, 90d supply, fill #1

## 2020-07-21 ENCOUNTER — Other Ambulatory Visit (HOSPITAL_COMMUNITY): Payer: Self-pay

## 2020-08-01 ENCOUNTER — Other Ambulatory Visit (HOSPITAL_COMMUNITY): Payer: Self-pay

## 2020-08-02 ENCOUNTER — Other Ambulatory Visit (HOSPITAL_COMMUNITY): Payer: Self-pay

## 2020-08-07 ENCOUNTER — Other Ambulatory Visit (HOSPITAL_COMMUNITY): Payer: Self-pay

## 2020-08-07 MED ORDER — FOLIC ACID 1 MG PO TABS
1.0000 mg | ORAL_TABLET | Freq: Every day | ORAL | 1 refills | Status: DC
  Filled 2020-08-07: qty 90, 90d supply, fill #0
  Filled 2020-10-23: qty 90, 90d supply, fill #1

## 2020-08-09 DIAGNOSIS — Z79899 Other long term (current) drug therapy: Secondary | ICD-10-CM | POA: Diagnosis not present

## 2020-08-09 DIAGNOSIS — M059 Rheumatoid arthritis with rheumatoid factor, unspecified: Secondary | ICD-10-CM | POA: Diagnosis not present

## 2020-08-09 DIAGNOSIS — C8400 Mycosis fungoides, unspecified site: Secondary | ICD-10-CM | POA: Diagnosis not present

## 2020-08-14 ENCOUNTER — Other Ambulatory Visit (HOSPITAL_COMMUNITY): Payer: Self-pay

## 2020-08-14 MED ORDER — CLINDAMYCIN HCL 300 MG PO CAPS
ORAL_CAPSULE | ORAL | 0 refills | Status: AC
  Filled 2020-08-14: qty 40, 10d supply, fill #0

## 2020-08-17 ENCOUNTER — Other Ambulatory Visit (HOSPITAL_COMMUNITY): Payer: Self-pay

## 2020-09-14 ENCOUNTER — Other Ambulatory Visit (HOSPITAL_COMMUNITY): Payer: Self-pay

## 2020-09-14 MED ORDER — METHOTREXATE SODIUM CHEMO INJECTION (PF) 50 MG/2ML
INTRAMUSCULAR | 1 refills | Status: AC
  Filled 2020-12-14: qty 8, 28d supply, fill #0
  Filled 2021-01-15: qty 8, 28d supply, fill #1

## 2020-09-14 MED ORDER — METHOTREXATE (PF) 25 MG/0.4ML ~~LOC~~ SOAJ: SUBCUTANEOUS | 1 refills | Status: DC

## 2020-09-14 MED FILL — Methotrexate Sodium Inj PF 50 MG/2ML (25 MG/ML): INTRAMUSCULAR | 28 days supply | Qty: 8 | Fill #0 | Status: AC

## 2020-09-15 ENCOUNTER — Other Ambulatory Visit (HOSPITAL_COMMUNITY): Payer: Self-pay

## 2020-09-16 ENCOUNTER — Other Ambulatory Visit (HOSPITAL_COMMUNITY): Payer: Self-pay

## 2020-09-16 MED ORDER — CLINDAMYCIN HCL 150 MG PO CAPS
150.0000 mg | ORAL_CAPSULE | Freq: Four times a day (QID) | ORAL | 0 refills | Status: AC
  Filled 2020-09-16: qty 24, 6d supply, fill #0
  Filled 2020-09-16: qty 16, 4d supply, fill #0

## 2020-09-18 ENCOUNTER — Other Ambulatory Visit (HOSPITAL_COMMUNITY): Payer: Self-pay

## 2020-09-19 ENCOUNTER — Other Ambulatory Visit (HOSPITAL_COMMUNITY): Payer: Self-pay

## 2020-10-09 ENCOUNTER — Other Ambulatory Visit (HOSPITAL_COMMUNITY): Payer: Self-pay

## 2020-10-11 ENCOUNTER — Other Ambulatory Visit (HOSPITAL_COMMUNITY): Payer: Self-pay

## 2020-10-13 ENCOUNTER — Other Ambulatory Visit (HOSPITAL_COMMUNITY): Payer: Self-pay

## 2020-10-13 DIAGNOSIS — M0579 Rheumatoid arthritis with rheumatoid factor of multiple sites without organ or systems involvement: Secondary | ICD-10-CM | POA: Diagnosis not present

## 2020-10-13 MED FILL — Methotrexate Sodium Inj PF 50 MG/2ML (25 MG/ML): INTRAMUSCULAR | 14 days supply | Qty: 4 | Fill #1 | Status: AC

## 2020-10-16 ENCOUNTER — Other Ambulatory Visit (HOSPITAL_COMMUNITY): Payer: Self-pay

## 2020-10-16 MED ORDER — METHOTREXATE SODIUM CHEMO INJECTION (PF) 50 MG/2ML
INTRAMUSCULAR | 1 refills | Status: AC
  Filled 2020-10-16 – 2020-10-23 (×2): qty 8, 28d supply, fill #0
  Filled 2020-11-17: qty 8, 28d supply, fill #1
  Filled 2021-02-13: qty 4, 14d supply, fill #2

## 2020-10-23 ENCOUNTER — Other Ambulatory Visit (HOSPITAL_COMMUNITY): Payer: Self-pay

## 2020-10-24 ENCOUNTER — Other Ambulatory Visit (HOSPITAL_COMMUNITY): Payer: Self-pay

## 2020-10-25 ENCOUNTER — Other Ambulatory Visit (HOSPITAL_COMMUNITY): Payer: Self-pay

## 2020-10-27 DIAGNOSIS — C8400 Mycosis fungoides, unspecified site: Secondary | ICD-10-CM | POA: Diagnosis not present

## 2020-10-27 DIAGNOSIS — M0579 Rheumatoid arthritis with rheumatoid factor of multiple sites without organ or systems involvement: Secondary | ICD-10-CM | POA: Diagnosis not present

## 2020-10-27 DIAGNOSIS — Z5112 Encounter for antineoplastic immunotherapy: Secondary | ICD-10-CM | POA: Diagnosis not present

## 2020-11-17 ENCOUNTER — Other Ambulatory Visit (HOSPITAL_COMMUNITY): Payer: Self-pay

## 2020-11-20 ENCOUNTER — Encounter (HOSPITAL_COMMUNITY): Payer: Self-pay

## 2020-11-20 ENCOUNTER — Other Ambulatory Visit (HOSPITAL_COMMUNITY): Payer: Self-pay

## 2020-11-20 ENCOUNTER — Emergency Department (HOSPITAL_COMMUNITY)
Admission: EM | Admit: 2020-11-20 | Discharge: 2020-11-20 | Disposition: A | Discharge status: Home / Self Care | Payer: 59 | Attending: Emergency Medicine | Admitting: Emergency Medicine

## 2020-11-20 ENCOUNTER — Emergency Department (HOSPITAL_COMMUNITY): Payer: 59

## 2020-11-20 DIAGNOSIS — D259 Leiomyoma of uterus, unspecified: Secondary | ICD-10-CM | POA: Insufficient documentation

## 2020-11-20 DIAGNOSIS — R1030 Lower abdominal pain, unspecified: Secondary | ICD-10-CM | POA: Diagnosis present

## 2020-11-20 DIAGNOSIS — R109 Unspecified abdominal pain: Secondary | ICD-10-CM | POA: Diagnosis not present

## 2020-11-20 DIAGNOSIS — N9489 Other specified conditions associated with female genital organs and menstrual cycle: Secondary | ICD-10-CM | POA: Diagnosis not present

## 2020-11-20 LAB — URINALYSIS, ROUTINE W REFLEX MICROSCOPIC
Bacteria, UA: NONE SEEN
Bilirubin Urine: NEGATIVE
Glucose, UA: NEGATIVE mg/dL
Ketones, ur: NEGATIVE mg/dL
Nitrite: NEGATIVE
Protein, ur: NEGATIVE mg/dL
Specific Gravity, Urine: 1.011 (ref 1.005–1.030)
pH: 6 (ref 5.0–8.0)

## 2020-11-20 LAB — CBC WITH DIFFERENTIAL/PLATELET
Abs Immature Granulocytes: 0.03 10*3/uL (ref 0.00–0.07)
Basophils Absolute: 0 10*3/uL (ref 0.0–0.1)
Basophils Relative: 0 %
Eosinophils Absolute: 0 10*3/uL (ref 0.0–0.5)
Eosinophils Relative: 0 %
HCT: 38.2 % (ref 36.0–46.0)
Hemoglobin: 12.6 g/dL (ref 12.0–15.0)
Immature Granulocytes: 0 %
Lymphocytes Relative: 7 %
Lymphs Abs: 0.6 10*3/uL — ABNORMAL LOW (ref 0.7–4.0)
MCH: 29.5 pg (ref 26.0–34.0)
MCHC: 33 g/dL (ref 30.0–36.0)
MCV: 89.5 fL (ref 80.0–100.0)
Monocytes Absolute: 0.4 10*3/uL (ref 0.1–1.0)
Monocytes Relative: 4 %
Neutro Abs: 8 10*3/uL — ABNORMAL HIGH (ref 1.7–7.7)
Neutrophils Relative %: 89 %
Platelets: 337 10*3/uL (ref 150–400)
RBC: 4.27 MIL/uL (ref 3.87–5.11)
RDW: 14.3 % (ref 11.5–15.5)
WBC: 9 10*3/uL (ref 4.0–10.5)
nRBC: 0 % (ref 0.0–0.2)

## 2020-11-20 LAB — I-STAT CHEM 8, ED
BUN: 8 mg/dL (ref 6–20)
Calcium, Ion: 1.25 mmol/L (ref 1.15–1.40)
Chloride: 105 mmol/L (ref 98–111)
Creatinine, Ser: 0.5 mg/dL (ref 0.44–1.00)
Glucose, Bld: 97 mg/dL (ref 70–99)
HCT: 39 % (ref 36.0–46.0)
Hemoglobin: 13.3 g/dL (ref 12.0–15.0)
Potassium: 3.6 mmol/L (ref 3.5–5.1)
Sodium: 138 mmol/L (ref 135–145)
TCO2: 22 mmol/L (ref 22–32)

## 2020-11-20 LAB — I-STAT BETA HCG BLOOD, ED (MC, WL, AP ONLY): I-stat hCG, quantitative: <5 m[IU]/mL (ref <5)

## 2020-11-20 LAB — WET PREP, GENITAL
Clue Cells Wet Prep HPF POC: NONE SEEN
Sperm: NONE SEEN
Trich, Wet Prep: NONE SEEN
WBC, Wet Prep HPF POC: >=10 — AB
Yeast Wet Prep HPF POC: NONE SEEN

## 2020-11-20 LAB — HIV ANTIBODY (ROUTINE TESTING W REFLEX): HIV Screen 4th Generation wRfx: NONREACTIVE

## 2020-11-20 MED ORDER — IOHEXOL 350 MG/ML SOLN
80.0000 mL | Freq: Once | INTRAVENOUS | Status: AC | PRN
  Administered 2020-11-20: 80 mL via INTRAVENOUS

## 2020-11-20 MED ORDER — MORPHINE SULFATE (PF) 4 MG/ML IV SOLN
4.0000 mg | Freq: Once | INTRAVENOUS | Status: AC
  Administered 2020-11-20: 4 mg via INTRAVENOUS
  Filled 2020-11-20: qty 1

## 2020-11-20 MED ORDER — HYDROXYZINE HCL 25 MG PO TABS
25.0000 mg | ORAL_TABLET | Freq: Three times a day (TID) | ORAL | 0 refills | Status: AC | PRN
  Filled 2020-11-20: qty 30, 10d supply, fill #0

## 2020-11-20 MED ORDER — SODIUM CHLORIDE 0.9 % IV BOLUS
1000.0000 mL | Freq: Once | INTRAVENOUS | Status: AC
  Administered 2020-11-20: 1000 mL via INTRAVENOUS

## 2020-11-20 MED ORDER — SODIUM CHLORIDE 0.9 % IV SOLN
1.0000 g | Freq: Once | INTRAVENOUS | Status: AC
  Administered 2020-11-20: 1 g via INTRAVENOUS
  Filled 2020-11-20: qty 10

## 2020-11-20 NOTE — Discharge Instructions (Signed)
Your pain may be due to uterine fibroids.  However, please call and follow up closely with OBGYN tomorrow as previously schedule.  Request for a pelvic exam for further assessment of your discomfort.

## 2020-11-20 NOTE — ED Provider Notes (Signed)
[facility] DEPT Provider Note   CSN: [id number] Arrival date & time: 11/20/20  4193     History Chief Complaint  Patient presents with   Abdominal Pain    Erin Clay is a 40 y.o. female.  The history is provided by the patient and medical records. No language interpreter was used.  Abdominal Pain  40 year old female significant history of rheumatoid arthritis who presents with complaints of lower abdominal pain.  For the past 2 weeks she has been having recurrent pain to her lower abdomen and vaginal spotting.  It started after she finished her menstruation but the vaginal spotting never fully resolved.  She endorsed lower abdominal cramping that started on the right side radiates towards the left, achy, constant, rated as 8 out of 10.  Furthermore she also endorsed having some urinary frequency urgency and burning on urination.  She noted some white mucousy vaginal discharge.  She endorsed occasional bouts of nausea without vomiting.  She denies any chest pain shortness of breath headache change in vision.  She believes she may have history of uterine fibroid.  She does have an appointment with an OB/GYN tomorrow at 10 AM.  At home she has been using Tylenol and Motrin with transient relief.  She endorsed feeling bloated.  She denies any new sexual partner.  She does have recurrent constipation from being on iron supplementation for her anemia.    Past Medical History:  Diagnosis Date   RA (rheumatoid arthritis) ([place])     Patient Active Problem List   Diagnosis Date Noted   Rheumatoid arthritis ([place]) 03/13/2018   Lymphadenopathy, axillary 03/13/2018   FUO (fever of unknown origin) 03/13/2018   Normocytic anemia 03/13/2018   Elevated liver enzymes 03/13/2018    Past Surgical History:  Procedure Laterality Date   AXILLARY LYMPH NODE BIOPSY Right 03/13/2018   Procedure: AXILLARY LYMPH NODE BIOPSY;  Surgeon: Johnathan Hausen, MD;  Location: [facility]  [facility];  Service: General;  Laterality: Right;     OB History   No obstetric history on file.     Family History  Family history unknown: Yes    Social History   Tobacco Use   Smoking status: Never   Smokeless tobacco: Never  Vaping Use   Vaping Use: Never used  Substance Use Topics   Alcohol use: No   Drug use: No    Home Medications Prior to Admission medications   Medication Sig Start Date End Date Taking? Authorizing Provider  acetaminophen (TYLENOL) 500 MG tablet Take 1,000 mg by mouth every 6 (six) hours as needed for mild pain or headache.    [provider]  clindamycin (CLEOCIN) 150 MG capsule Take 1 capsule (150 mg total) by mouth 4 (four) times daily until gone 09/15/20   Belcher, Bennie Pierini., DDS  clindamycin (CLEOCIN) 300 MG capsule Take One capsule by mouth every 6 hours for 10 days 08/14/20     ferrous sulfate 325 (65 FE) MG EC tablet Take 1 tablet by mouth once a day Patient taking differently: Take 325 mg by mouth daily with breakfast. 7/90/24     folic acid (FOLVITE) 1 MG tablet TAKE 1 TABLET BY MOUTH DAILY Patient taking differently: Take 1 mg by mouth daily. 03/17/20 03/17/21  Wendall Mola, MD  folic acid (FOLVITE) 1 MG tablet Take 1 tablet (1 mg total) by mouth daily. 08/07/20     hydrocortisone 2.5 % cream Apply as needed for itching to face twice daily. 06/12/20  hydroxychloroquine (PLAQUENIL) 200 MG tablet TAKE 1 TABLET BY MOUTH ONCE A DAY Patient taking differently: Take 200 mg by mouth daily. 04/06/20 04/06/21  Sampson Si, PA  hydroxychloroquine (PLAQUENIL) 200 MG tablet Take 1 tablet (200 mg total) by mouth daily. 07/19/20     hydrOXYzine (ATARAX/VISTARIL) 25 MG tablet Take 1 tablet (25 mg total) by mouth 3 (three) times daily as needed. 11/19/20     hydrOXYzine (ATARAX/VISTARIL) 50 MG tablet Take 1 tablet (50 mg total) by mouth every 8 (eight) hours as needed for itching. 06/14/20   Chase Picket, MD  Methotrexate Sodium (METHOTREXATE, PF,)  50 MG/2ML injection Inject  0.8 mls into the skin once a week 07/04/20     Methotrexate Sodium (METHOTREXATE, PF,) 50 MG/2ML injection Inject 0.8 mls into the skin once a week (discard vial after use) 10/16/20     Methotrexate, PF, 25 MG/0.5ML SOAJ Inject 0.4 mLs (20 mg) into the skin once a week 09/14/20     triamcinolone ointment (KENALOG) 0.1 % Apply to body once daily for itching 06/13/20       Allergies    Sulfasalazine  Review of Systems   Review of Systems  Gastrointestinal:  Positive for abdominal pain.  All other systems reviewed and are negative.  Physical Exam Updated Vital Signs BP (!) 146/91 (BP Location: Right Arm)   Pulse (!) 116   Temp 98.7 F (37.1 C) (Oral)   Resp 20   LMP 11/20/2020   SpO2 100%   Physical Exam Vitals and nursing note reviewed.  Constitutional:      General: She is not in acute distress.    Appearance: She is well-developed.  HENT:     Head: Atraumatic.  Eyes:     Conjunctiva/sclera: Conjunctivae normal.  Cardiovascular:     Rate and Rhythm: Normal rate and regular rhythm.     Heart sounds: Normal heart sounds.  Pulmonary:     Effort: Pulmonary effort is normal.  Abdominal:     General: Abdomen is flat.     Palpations: Abdomen is soft.     Tenderness: There is abdominal tenderness in the suprapubic area. There is no guarding or rebound.  Genitourinary:    Comments: Please refer to procedure note Musculoskeletal:     Cervical back: Neck supple.  Skin:    Findings: No rash.  Neurological:     Mental Status: She is alert.  Psychiatric:        Mood and Affect: Mood normal.    ED Results / Procedures / Treatments   Labs (all labs ordered are listed, but only abnormal results are displayed) Labs Reviewed  WET PREP, GENITAL - Abnormal; Notable for the following components:      Result Value   WBC, Wet Prep HPF POC >=10 (*)    All other components within normal limits  URINALYSIS, ROUTINE W REFLEX MICROSCOPIC - Abnormal; Notable for  the following components:   Color, Urine STRAW (*)    Hgb urine dipstick MODERATE (*)    Leukocytes,Ua MODERATE (*)    All other components within normal limits  CBC WITH DIFFERENTIAL/PLATELET - Abnormal; Notable for the following components:   Neutro Abs 8.0 (*)    Lymphs Abs 0.6 (*)    All other components within normal limits  GC/CHLAMYDIA PROBE AMP ([place]) NOT AT [facility] - Abnormal; Notable for the following components:   Chlamydia Positive (*)    All other components within normal limits  RPR  HIV ANTIBODY (ROUTINE  TESTING W REFLEX)  I-STAT BETA HCG BLOOD, ED ([facility], [facility], [facility] ONLY)  I-STAT CHEM 8, ED    EKG None  Radiology No results found.  Procedures Pelvic exam  Date/Time: 11/20/2020 8:45 AM Performed by: Domenic Moras, PA-C Authorized by: Domenic Moras, PA-C  Consent: Verbal consent obtained. Risks and benefits: risks, benefits and alternatives were discussed Consent given by: patient Patient understanding: patient states understanding of the procedure being performed Patient consent: the patient's understanding of the procedure matches consent given Patient identity confirmed: verbally with patient and arm band Local anesthesia used: no  Anesthesia: Local anesthesia used: no  Sedation: Patient sedated: no  Comments: Chaperone present during exam.  No inguinal lymphadenopathy or inguinal hernia noted.  Normal external genitalia.  Moderate amount of white vaginal discharge noted in vaginal vault.  Close cervical os, a 2 cm fleshy appearing tissue protruding out from this os with surrounding friable mucosa changes at the T-zone.  Small mount of blood noted in vaginal vault.  On bimanual examination mild bilateral adnexal tenderness without cervical motion tenderness.     Medications Ordered in ED Medications  morphine 4 MG/ML injection 4 mg (4 mg Intravenous Given 11/20/20 1034)  sodium chloride 0.9 % bolus 1,000 mL (0 mLs Intravenous Stopped 11/20/20 1257)   cefTRIAXone (ROCEPHIN) 1 g in sodium chloride 0.9 % 100 mL IVPB (0 g Intravenous Stopped 11/20/20 1312)  iohexol (OMNIPAQUE) 350 MG/ML injection 80 mL (80 mLs Intravenous Contrast Given 11/20/20 0953)    ED Course  I have reviewed the triage vital signs and the nursing notes.  Pertinent labs & imaging results that were available during my care of the patient were reviewed by me and considered in my medical decision making (see chart for details).    MDM Rules/Calculators/A&P                           BP 123/73   Pulse (!) 112   Temp 98.7 F (37.1 C) (Oral)   Resp 18   LMP 11/20/2020   SpO2 96%   Final Clinical Impression(s) / ED Diagnoses Final diagnoses:  Uterine leiomyoma, unspecified location    Rx / DC Orders ED Discharge Orders     None      8:16 AM Patient endorsed urinary discomfort, vaginal spotting, and lower abdominal pain for approximately 2 weeks.  Patient does have moderate amount of white vaginal discharge as well as friable mucosal changes at the fundus of the uterus.  She also has a 2 cm dystrophic mass protruding from the os.  Given her presentation and discomfort, will obtain abd/pelvis CT scan for further assessment.  Doubt ovarian torsion or ectopic pregnancy.  Preg test negative.    Domenic Moras, PA-C 11/24/20 [place], [place], DO 11/27/20 [phone]

## 2020-11-20 NOTE — ED Triage Notes (Signed)
Pt presents with c/o abdominal pain with some vaginal bleeding. Pt reports that she is on her period but it has been an abnormal period. Pt also reports some body aches and a low-grade fever.

## 2020-11-21 ENCOUNTER — Other Ambulatory Visit (HOSPITAL_COMMUNITY): Payer: Self-pay

## 2020-11-21 DIAGNOSIS — N926 Irregular menstruation, unspecified: Secondary | ICD-10-CM | POA: Diagnosis not present

## 2020-11-21 DIAGNOSIS — D259 Leiomyoma of uterus, unspecified: Secondary | ICD-10-CM | POA: Diagnosis not present

## 2020-11-21 LAB — GC/CHLAMYDIA PROBE AMP (~~LOC~~) NOT AT ARMC
Chlamydia: POSITIVE — AB
Comment: NEGATIVE
Comment: NORMAL
Neisseria Gonorrhea: NEGATIVE

## 2020-11-21 LAB — RPR: RPR Ser Ql: NONREACTIVE

## 2020-11-21 MED ORDER — DOXYCYCLINE HYCLATE 100 MG PO TABS
ORAL_TABLET | ORAL | 0 refills | Status: AC
  Filled 2020-11-21: qty 14, 7d supply, fill #0

## 2020-11-22 ENCOUNTER — Other Ambulatory Visit (HOSPITAL_COMMUNITY): Payer: Self-pay

## 2020-12-04 DIAGNOSIS — Z79899 Other long term (current) drug therapy: Secondary | ICD-10-CM | POA: Diagnosis not present

## 2020-12-04 DIAGNOSIS — D509 Iron deficiency anemia, unspecified: Secondary | ICD-10-CM | POA: Diagnosis not present

## 2020-12-04 DIAGNOSIS — M069 Rheumatoid arthritis, unspecified: Secondary | ICD-10-CM | POA: Diagnosis not present

## 2020-12-04 DIAGNOSIS — C8408 Mycosis fungoides, lymph nodes of multiple sites: Secondary | ICD-10-CM | POA: Diagnosis not present

## 2020-12-04 DIAGNOSIS — C8400 Mycosis fungoides, unspecified site: Secondary | ICD-10-CM | POA: Diagnosis not present

## 2020-12-06 ENCOUNTER — Other Ambulatory Visit (HOSPITAL_COMMUNITY): Payer: Self-pay

## 2020-12-06 DIAGNOSIS — D259 Leiomyoma of uterus, unspecified: Secondary | ICD-10-CM | POA: Diagnosis not present

## 2020-12-06 DIAGNOSIS — N946 Dysmenorrhea, unspecified: Secondary | ICD-10-CM | POA: Diagnosis not present

## 2020-12-06 DIAGNOSIS — N926 Irregular menstruation, unspecified: Secondary | ICD-10-CM | POA: Diagnosis not present

## 2020-12-06 DIAGNOSIS — N920 Excessive and frequent menstruation with regular cycle: Secondary | ICD-10-CM | POA: Diagnosis not present

## 2020-12-06 DIAGNOSIS — D219 Benign neoplasm of connective and other soft tissue, unspecified: Secondary | ICD-10-CM | POA: Diagnosis not present

## 2020-12-06 DIAGNOSIS — N854 Malposition of uterus: Secondary | ICD-10-CM | POA: Diagnosis not present

## 2020-12-06 MED ORDER — TRANEXAMIC ACID 650 MG PO TABS
ORAL_TABLET | ORAL | 3 refills | Status: AC
  Filled 2020-12-06: qty 30, 5d supply, fill #0

## 2020-12-14 ENCOUNTER — Other Ambulatory Visit (HOSPITAL_COMMUNITY): Payer: Self-pay

## 2020-12-15 ENCOUNTER — Other Ambulatory Visit (HOSPITAL_COMMUNITY): Payer: Self-pay

## 2020-12-22 ENCOUNTER — Other Ambulatory Visit (HOSPITAL_COMMUNITY): Payer: Self-pay

## 2021-01-15 ENCOUNTER — Other Ambulatory Visit (HOSPITAL_COMMUNITY): Payer: Self-pay

## 2021-01-15 MED ORDER — FOLIC ACID 1 MG PO TABS
1.0000 mg | ORAL_TABLET | Freq: Every day | ORAL | 1 refills | Status: DC
  Filled 2021-01-15: qty 90, 90d supply, fill #0
  Filled 2021-05-16: qty 90, 90d supply, fill #1

## 2021-01-15 MED FILL — Hydroxychloroquine Sulfate Tab 200 MG: ORAL | 90 days supply | Qty: 90 | Fill #0 | Status: AC

## 2021-01-16 ENCOUNTER — Other Ambulatory Visit (HOSPITAL_COMMUNITY): Payer: Self-pay

## 2021-02-02 ENCOUNTER — Other Ambulatory Visit (HOSPITAL_COMMUNITY): Payer: Self-pay

## 2021-02-02 MED ORDER — HYDROXYCHLOROQUINE SULFATE 200 MG PO TABS
200.0000 mg | ORAL_TABLET | Freq: Every day | ORAL | 1 refills | Status: AC
  Filled 2021-02-02 – 2021-05-16 (×2): qty 90, 90d supply, fill #0
  Filled 2021-08-21: qty 90, 90d supply, fill #1

## 2021-02-13 ENCOUNTER — Other Ambulatory Visit (HOSPITAL_COMMUNITY): Payer: Self-pay

## 2021-02-15 ENCOUNTER — Other Ambulatory Visit (HOSPITAL_COMMUNITY): Payer: Self-pay

## 2021-02-15 DIAGNOSIS — H52203 Unspecified astigmatism, bilateral: Secondary | ICD-10-CM | POA: Diagnosis not present

## 2021-02-15 DIAGNOSIS — M059 Rheumatoid arthritis with rheumatoid factor, unspecified: Secondary | ICD-10-CM | POA: Diagnosis not present

## 2021-02-15 DIAGNOSIS — H532 Diplopia: Secondary | ICD-10-CM | POA: Diagnosis not present

## 2021-02-15 DIAGNOSIS — H527 Unspecified disorder of refraction: Secondary | ICD-10-CM | POA: Diagnosis not present

## 2021-02-15 DIAGNOSIS — R6889 Other general symptoms and signs: Secondary | ICD-10-CM | POA: Diagnosis not present

## 2021-02-15 DIAGNOSIS — C8400 Mycosis fungoides, unspecified site: Secondary | ICD-10-CM | POA: Diagnosis not present

## 2021-02-15 DIAGNOSIS — Z79899 Other long term (current) drug therapy: Secondary | ICD-10-CM | POA: Diagnosis not present

## 2021-02-15 DIAGNOSIS — H40003 Preglaucoma, unspecified, bilateral: Secondary | ICD-10-CM | POA: Diagnosis not present

## 2021-02-16 ENCOUNTER — Other Ambulatory Visit (HOSPITAL_COMMUNITY): Payer: Self-pay

## 2021-02-16 MED ORDER — METHOTREXATE SODIUM CHEMO INJECTION (PF) 50 MG/2ML
INTRAMUSCULAR | 1 refills | Status: AC
  Filled 2021-02-16: qty 10, 70d supply, fill #0
  Filled 2021-02-16: qty 8, 28d supply, fill #0
  Filled 2021-03-19: qty 8, 28d supply, fill #1
  Filled 2021-04-16: qty 4, 14d supply, fill #2
  Filled 2021-04-18: qty 2, 7d supply, fill #2

## 2021-03-19 ENCOUNTER — Other Ambulatory Visit (HOSPITAL_COMMUNITY): Payer: Self-pay

## 2021-03-20 ENCOUNTER — Other Ambulatory Visit (HOSPITAL_COMMUNITY): Payer: Self-pay

## 2021-04-13 DIAGNOSIS — Z5112 Encounter for antineoplastic immunotherapy: Secondary | ICD-10-CM | POA: Diagnosis not present

## 2021-04-13 DIAGNOSIS — C8408 Mycosis fungoides, lymph nodes of multiple sites: Secondary | ICD-10-CM | POA: Diagnosis not present

## 2021-04-13 DIAGNOSIS — M0579 Rheumatoid arthritis with rheumatoid factor of multiple sites without organ or systems involvement: Secondary | ICD-10-CM | POA: Diagnosis not present

## 2021-04-16 ENCOUNTER — Other Ambulatory Visit (HOSPITAL_COMMUNITY): Payer: Self-pay

## 2021-04-18 ENCOUNTER — Other Ambulatory Visit (HOSPITAL_COMMUNITY): Payer: Self-pay

## 2021-04-20 ENCOUNTER — Other Ambulatory Visit (HOSPITAL_COMMUNITY): Payer: Self-pay

## 2021-04-20 MED ORDER — METHOTREXATE SODIUM CHEMO INJECTION 50 MG/2ML
INTRAMUSCULAR | 0 refills | Status: DC
  Filled 2021-04-20: qty 10, 70d supply, fill #0

## 2021-04-24 ENCOUNTER — Other Ambulatory Visit (HOSPITAL_COMMUNITY): Payer: Self-pay

## 2021-04-27 DIAGNOSIS — C8408 Mycosis fungoides, lymph nodes of multiple sites: Secondary | ICD-10-CM | POA: Diagnosis not present

## 2021-04-27 DIAGNOSIS — M0579 Rheumatoid arthritis with rheumatoid factor of multiple sites without organ or systems involvement: Secondary | ICD-10-CM | POA: Diagnosis not present

## 2021-04-27 DIAGNOSIS — Z5112 Encounter for antineoplastic immunotherapy: Secondary | ICD-10-CM | POA: Diagnosis not present

## 2021-04-27 DIAGNOSIS — Z79899 Other long term (current) drug therapy: Secondary | ICD-10-CM | POA: Diagnosis not present

## 2021-05-09 DIAGNOSIS — N390 Urinary tract infection, site not specified: Secondary | ICD-10-CM | POA: Diagnosis not present

## 2021-05-09 DIAGNOSIS — R350 Frequency of micturition: Secondary | ICD-10-CM | POA: Diagnosis not present

## 2021-05-15 ENCOUNTER — Other Ambulatory Visit (HOSPITAL_COMMUNITY): Payer: Self-pay

## 2021-05-15 MED ORDER — HYDROXYZINE HCL 25 MG PO TABS
25.0000 mg | ORAL_TABLET | Freq: Three times a day (TID) | ORAL | 0 refills | Status: DC | PRN
  Filled 2021-05-15: qty 30, 10d supply, fill #0

## 2021-05-16 ENCOUNTER — Other Ambulatory Visit (HOSPITAL_COMMUNITY): Payer: Self-pay

## 2021-05-17 ENCOUNTER — Other Ambulatory Visit (HOSPITAL_COMMUNITY): Payer: Self-pay

## 2021-06-04 ENCOUNTER — Other Ambulatory Visit (HOSPITAL_COMMUNITY): Payer: Self-pay

## 2021-06-04 DIAGNOSIS — M069 Rheumatoid arthritis, unspecified: Secondary | ICD-10-CM | POA: Diagnosis not present

## 2021-06-04 DIAGNOSIS — N3281 Overactive bladder: Secondary | ICD-10-CM | POA: Diagnosis not present

## 2021-06-04 DIAGNOSIS — C8400 Mycosis fungoides, unspecified site: Secondary | ICD-10-CM | POA: Diagnosis not present

## 2021-06-04 DIAGNOSIS — Z79899 Other long term (current) drug therapy: Secondary | ICD-10-CM | POA: Diagnosis not present

## 2021-06-04 DIAGNOSIS — Z87898 Personal history of other specified conditions: Secondary | ICD-10-CM | POA: Diagnosis not present

## 2021-06-04 DIAGNOSIS — N920 Excessive and frequent menstruation with regular cycle: Secondary | ICD-10-CM | POA: Diagnosis not present

## 2021-06-04 DIAGNOSIS — D500 Iron deficiency anemia secondary to blood loss (chronic): Secondary | ICD-10-CM | POA: Diagnosis not present

## 2021-06-04 DIAGNOSIS — Z79631 Long term (current) use of antimetabolite agent: Secondary | ICD-10-CM | POA: Diagnosis not present

## 2021-06-04 DIAGNOSIS — R350 Frequency of micturition: Secondary | ICD-10-CM | POA: Diagnosis not present

## 2021-06-04 MED ORDER — OXYBUTYNIN CHLORIDE ER 10 MG PO TB24
10.0000 mg | ORAL_TABLET | Freq: Every day | ORAL | 1 refills | Status: AC
  Filled 2021-06-04: qty 90, 90d supply, fill #0

## 2021-06-15 DIAGNOSIS — M059 Rheumatoid arthritis with rheumatoid factor, unspecified: Secondary | ICD-10-CM | POA: Diagnosis not present

## 2021-06-15 DIAGNOSIS — C8400 Mycosis fungoides, unspecified site: Secondary | ICD-10-CM | POA: Diagnosis not present

## 2021-06-15 DIAGNOSIS — Z79899 Other long term (current) drug therapy: Secondary | ICD-10-CM | POA: Diagnosis not present

## 2021-07-02 ENCOUNTER — Other Ambulatory Visit (HOSPITAL_COMMUNITY): Payer: Self-pay

## 2021-07-03 ENCOUNTER — Other Ambulatory Visit (HOSPITAL_COMMUNITY): Payer: Self-pay

## 2021-07-04 ENCOUNTER — Other Ambulatory Visit (HOSPITAL_COMMUNITY): Payer: Self-pay

## 2021-07-04 MED ORDER — METHOTREXATE SODIUM CHEMO INJECTION 50 MG/2ML
INTRAMUSCULAR | 0 refills | Status: DC
  Filled 2021-07-04: qty 10, 70d supply, fill #0

## 2021-08-21 ENCOUNTER — Other Ambulatory Visit (HOSPITAL_COMMUNITY): Payer: Self-pay

## 2021-08-21 MED ORDER — FOLIC ACID 1 MG PO TABS
1.0000 mg | ORAL_TABLET | Freq: Every day | ORAL | 1 refills | Status: DC
  Filled 2021-08-21: qty 90, 90d supply, fill #0
  Filled 2021-12-06: qty 90, 90d supply, fill #1

## 2021-08-30 ENCOUNTER — Other Ambulatory Visit (HOSPITAL_COMMUNITY): Payer: Self-pay

## 2021-08-30 MED ORDER — HYDROXYZINE HCL 25 MG PO TABS
25.0000 mg | ORAL_TABLET | Freq: Three times a day (TID) | ORAL | 0 refills | Status: DC | PRN
  Filled 2021-08-30: qty 30, 10d supply, fill #0

## 2021-08-31 ENCOUNTER — Other Ambulatory Visit (HOSPITAL_COMMUNITY): Payer: Self-pay

## 2021-09-04 ENCOUNTER — Other Ambulatory Visit (HOSPITAL_COMMUNITY): Payer: Self-pay

## 2021-09-04 DIAGNOSIS — Z79899 Other long term (current) drug therapy: Secondary | ICD-10-CM | POA: Diagnosis not present

## 2021-09-04 DIAGNOSIS — Z87898 Personal history of other specified conditions: Secondary | ICD-10-CM | POA: Diagnosis not present

## 2021-09-04 DIAGNOSIS — N3281 Overactive bladder: Secondary | ICD-10-CM | POA: Diagnosis not present

## 2021-09-04 DIAGNOSIS — M059 Rheumatoid arthritis with rheumatoid factor, unspecified: Secondary | ICD-10-CM | POA: Diagnosis not present

## 2021-09-04 DIAGNOSIS — R350 Frequency of micturition: Secondary | ICD-10-CM | POA: Diagnosis not present

## 2021-09-04 DIAGNOSIS — C8400 Mycosis fungoides, unspecified site: Secondary | ICD-10-CM | POA: Diagnosis not present

## 2021-09-04 MED ORDER — OXYBUTYNIN CHLORIDE ER 15 MG PO TB24
15.0000 mg | ORAL_TABLET | Freq: Every day | ORAL | 3 refills | Status: DC
  Filled 2021-09-04: qty 90, 90d supply, fill #0

## 2021-09-05 ENCOUNTER — Other Ambulatory Visit (HOSPITAL_COMMUNITY): Payer: Self-pay

## 2021-09-05 MED ORDER — METHOTREXATE SODIUM CHEMO INJECTION 50 MG/2ML
INTRAMUSCULAR | 0 refills | Status: DC
  Filled 2021-09-05: qty 8, 56d supply, fill #0
  Filled 2021-11-20: qty 2, 14d supply, fill #1

## 2021-10-18 ENCOUNTER — Other Ambulatory Visit (HOSPITAL_COMMUNITY): Payer: Self-pay

## 2021-10-18 MED ORDER — TOLTERODINE TARTRATE ER 4 MG PO CP24
ORAL_CAPSULE | ORAL | 2 refills | Status: AC
  Filled 2021-10-18: qty 30, 30d supply, fill #0

## 2021-10-26 DIAGNOSIS — C8400 Mycosis fungoides, unspecified site: Secondary | ICD-10-CM | POA: Diagnosis not present

## 2021-10-26 DIAGNOSIS — Z5112 Encounter for antineoplastic immunotherapy: Secondary | ICD-10-CM | POA: Diagnosis not present

## 2021-10-26 DIAGNOSIS — M0579 Rheumatoid arthritis with rheumatoid factor of multiple sites without organ or systems involvement: Secondary | ICD-10-CM | POA: Diagnosis not present

## 2021-10-26 DIAGNOSIS — Z79899 Other long term (current) drug therapy: Secondary | ICD-10-CM | POA: Diagnosis not present

## 2021-11-09 DIAGNOSIS — C8400 Mycosis fungoides, unspecified site: Secondary | ICD-10-CM | POA: Diagnosis not present

## 2021-11-09 DIAGNOSIS — Z5112 Encounter for antineoplastic immunotherapy: Secondary | ICD-10-CM | POA: Diagnosis not present

## 2021-11-09 DIAGNOSIS — M0579 Rheumatoid arthritis with rheumatoid factor of multiple sites without organ or systems involvement: Secondary | ICD-10-CM | POA: Diagnosis not present

## 2021-11-12 ENCOUNTER — Other Ambulatory Visit (HOSPITAL_COMMUNITY): Payer: Self-pay

## 2021-11-12 MED ORDER — HYDROXYZINE HCL 25 MG PO TABS
25.0000 mg | ORAL_TABLET | Freq: Three times a day (TID) | ORAL | 0 refills | Status: AC | PRN
  Filled 2021-11-12: qty 30, 10d supply, fill #0

## 2021-11-13 ENCOUNTER — Other Ambulatory Visit (HOSPITAL_COMMUNITY): Payer: Self-pay

## 2021-11-13 MED ORDER — TOLTERODINE TARTRATE ER 4 MG PO CP24
4.0000 mg | ORAL_CAPSULE | Freq: Every day | ORAL | 2 refills | Status: DC
  Filled 2021-11-13: qty 30, 30d supply, fill #0

## 2021-11-20 ENCOUNTER — Other Ambulatory Visit (HOSPITAL_COMMUNITY): Payer: Self-pay

## 2021-11-23 ENCOUNTER — Other Ambulatory Visit (HOSPITAL_COMMUNITY): Payer: Self-pay

## 2021-11-26 ENCOUNTER — Other Ambulatory Visit (HOSPITAL_COMMUNITY): Payer: Self-pay

## 2021-11-27 ENCOUNTER — Other Ambulatory Visit (HOSPITAL_COMMUNITY): Payer: Self-pay

## 2021-11-27 MED ORDER — METHOTREXATE SODIUM CHEMO INJECTION 50 MG/2ML
25.0000 mg | INTRAMUSCULAR | 0 refills | Status: AC
  Filled 2021-11-27: qty 10, 70d supply, fill #0

## 2021-11-28 ENCOUNTER — Other Ambulatory Visit (HOSPITAL_COMMUNITY): Payer: Self-pay

## 2021-11-28 MED ORDER — HYDROXYCHLOROQUINE SULFATE 200 MG PO TABS
200.0000 mg | ORAL_TABLET | Freq: Every day | ORAL | 1 refills | Status: AC
  Filled 2021-11-28: qty 90, 90d supply, fill #0
  Filled 2022-03-12: qty 90, 90d supply, fill #1

## 2021-11-29 ENCOUNTER — Other Ambulatory Visit (HOSPITAL_COMMUNITY): Payer: Self-pay

## 2021-11-29 MED ORDER — METHOTREXATE SODIUM CHEMO INJECTION 50 MG/2ML
25.0000 mg | INTRAMUSCULAR | 0 refills | Status: AC
  Filled 2021-11-29: qty 10, 70d supply, fill #0
  Filled 2021-12-01: qty 10, 10d supply, fill #0

## 2021-11-30 ENCOUNTER — Other Ambulatory Visit (HOSPITAL_COMMUNITY): Payer: Self-pay

## 2021-12-01 ENCOUNTER — Other Ambulatory Visit (HOSPITAL_COMMUNITY): Payer: Self-pay

## 2021-12-05 ENCOUNTER — Other Ambulatory Visit (HOSPITAL_COMMUNITY): Payer: Self-pay

## 2021-12-05 DIAGNOSIS — R350 Frequency of micturition: Secondary | ICD-10-CM | POA: Diagnosis not present

## 2021-12-05 DIAGNOSIS — N3281 Overactive bladder: Secondary | ICD-10-CM | POA: Diagnosis not present

## 2021-12-05 MED ORDER — MYRBETRIQ 25 MG PO TB24
25.0000 mg | ORAL_TABLET | Freq: Every day | ORAL | 3 refills | Status: AC
  Filled 2021-12-05: qty 30, 30d supply, fill #0
  Filled 2021-12-28 – 2021-12-31 (×2): qty 30, 30d supply, fill #1
  Filled 2022-01-29 – 2022-01-30 (×2): qty 30, 30d supply, fill #2

## 2021-12-06 ENCOUNTER — Other Ambulatory Visit (HOSPITAL_COMMUNITY): Payer: Self-pay

## 2021-12-19 DIAGNOSIS — C8400 Mycosis fungoides, unspecified site: Secondary | ICD-10-CM | POA: Diagnosis not present

## 2021-12-19 DIAGNOSIS — Z79899 Other long term (current) drug therapy: Secondary | ICD-10-CM | POA: Diagnosis not present

## 2021-12-19 DIAGNOSIS — M059 Rheumatoid arthritis with rheumatoid factor, unspecified: Secondary | ICD-10-CM | POA: Diagnosis not present

## 2021-12-28 ENCOUNTER — Other Ambulatory Visit (HOSPITAL_COMMUNITY): Payer: Self-pay

## 2021-12-31 ENCOUNTER — Other Ambulatory Visit (HOSPITAL_COMMUNITY): Payer: Self-pay

## 2022-01-29 ENCOUNTER — Other Ambulatory Visit (HOSPITAL_COMMUNITY): Payer: Self-pay

## 2022-01-30 ENCOUNTER — Other Ambulatory Visit (HOSPITAL_COMMUNITY): Payer: Self-pay

## 2022-01-31 ENCOUNTER — Other Ambulatory Visit (HOSPITAL_COMMUNITY): Payer: Self-pay

## 2022-02-02 ENCOUNTER — Other Ambulatory Visit (HOSPITAL_COMMUNITY): Payer: Self-pay

## 2022-02-02 MED ORDER — TROSPIUM CHLORIDE 20 MG PO TABS
20.0000 mg | ORAL_TABLET | Freq: Two times a day (BID) | ORAL | 5 refills | Status: DC
  Filled 2022-02-02: qty 60, 30d supply, fill #0

## 2022-02-04 ENCOUNTER — Other Ambulatory Visit (HOSPITAL_COMMUNITY): Payer: Self-pay

## 2022-02-05 ENCOUNTER — Other Ambulatory Visit (HOSPITAL_COMMUNITY): Payer: Self-pay

## 2022-02-07 ENCOUNTER — Other Ambulatory Visit (HOSPITAL_COMMUNITY): Payer: Self-pay

## 2022-02-07 DIAGNOSIS — N3281 Overactive bladder: Secondary | ICD-10-CM | POA: Diagnosis not present

## 2022-02-07 DIAGNOSIS — Z87898 Personal history of other specified conditions: Secondary | ICD-10-CM | POA: Diagnosis not present

## 2022-02-07 MED ORDER — TROSPIUM CHLORIDE ER 60 MG PO CP24
1.0000 | ORAL_CAPSULE | Freq: Every day | ORAL | 3 refills | Status: AC
  Filled 2022-02-07: qty 90, 90d supply, fill #0
  Filled 2022-11-20: qty 90, 90d supply, fill #1

## 2022-02-08 ENCOUNTER — Other Ambulatory Visit (HOSPITAL_COMMUNITY): Payer: Self-pay

## 2022-03-06 ENCOUNTER — Other Ambulatory Visit (HOSPITAL_COMMUNITY): Payer: Self-pay

## 2022-03-06 MED ORDER — METHOTREXATE SODIUM CHEMO INJECTION 50 MG/2ML
25.0000 mg | INTRAMUSCULAR | 0 refills | Status: AC
  Filled 2022-03-06 – 2022-03-18 (×2): qty 10, 70d supply, fill #0

## 2022-03-12 ENCOUNTER — Other Ambulatory Visit (HOSPITAL_COMMUNITY): Payer: Self-pay

## 2022-03-12 MED ORDER — FOLIC ACID 1 MG PO TABS
1.0000 mg | ORAL_TABLET | Freq: Every day | ORAL | 0 refills | Status: DC
  Filled 2022-03-12: qty 90, 90d supply, fill #0
  Filled 2023-02-10: qty 90, 90d supply, fill #1

## 2022-03-13 ENCOUNTER — Other Ambulatory Visit (HOSPITAL_COMMUNITY): Payer: Self-pay

## 2022-03-13 MED ORDER — HYDROXYZINE HCL 25 MG PO TABS
25.0000 mg | ORAL_TABLET | Freq: Three times a day (TID) | ORAL | 0 refills | Status: DC | PRN
  Filled 2022-03-13: qty 30, 10d supply, fill #0

## 2022-03-14 DIAGNOSIS — C8400 Mycosis fungoides, unspecified site: Secondary | ICD-10-CM | POA: Diagnosis not present

## 2022-03-14 DIAGNOSIS — Z882 Allergy status to sulfonamides status: Secondary | ICD-10-CM | POA: Diagnosis not present

## 2022-03-14 DIAGNOSIS — Z79631 Long term (current) use of antimetabolite agent: Secondary | ICD-10-CM | POA: Diagnosis not present

## 2022-03-14 DIAGNOSIS — Z79899 Other long term (current) drug therapy: Secondary | ICD-10-CM | POA: Diagnosis not present

## 2022-03-14 DIAGNOSIS — M059 Rheumatoid arthritis with rheumatoid factor, unspecified: Secondary | ICD-10-CM | POA: Diagnosis not present

## 2022-03-15 ENCOUNTER — Other Ambulatory Visit (HOSPITAL_COMMUNITY): Payer: Self-pay

## 2022-03-15 MED ORDER — HYDROXYCHLOROQUINE SULFATE 200 MG PO TABS
200.0000 mg | ORAL_TABLET | Freq: Every day | ORAL | 1 refills | Status: AC
  Filled 2022-03-15 – 2022-06-05 (×2): qty 90, 90d supply, fill #0
  Filled 2022-10-21 (×2): qty 90, 90d supply, fill #1

## 2022-03-15 MED ORDER — METHOTREXATE SODIUM CHEMO INJECTION 50 MG/2ML
25.0000 mg | INTRAMUSCULAR | 0 refills | Status: AC
  Filled 2022-03-15 – 2022-08-17 (×3): qty 10, 70d supply, fill #0

## 2022-03-16 ENCOUNTER — Other Ambulatory Visit (HOSPITAL_COMMUNITY): Payer: Self-pay

## 2022-03-18 ENCOUNTER — Other Ambulatory Visit (HOSPITAL_COMMUNITY): Payer: Self-pay

## 2022-03-28 DIAGNOSIS — Z79899 Other long term (current) drug therapy: Secondary | ICD-10-CM | POA: Diagnosis not present

## 2022-03-28 DIAGNOSIS — Z83511 Family history of glaucoma: Secondary | ICD-10-CM | POA: Diagnosis not present

## 2022-03-28 DIAGNOSIS — M069 Rheumatoid arthritis, unspecified: Secondary | ICD-10-CM | POA: Diagnosis not present

## 2022-03-28 DIAGNOSIS — M059 Rheumatoid arthritis with rheumatoid factor, unspecified: Secondary | ICD-10-CM | POA: Diagnosis not present

## 2022-03-28 DIAGNOSIS — H5213 Myopia, bilateral: Secondary | ICD-10-CM | POA: Diagnosis not present

## 2022-03-28 DIAGNOSIS — H524 Presbyopia: Secondary | ICD-10-CM | POA: Diagnosis not present

## 2022-05-09 ENCOUNTER — Other Ambulatory Visit (HOSPITAL_COMMUNITY): Payer: Self-pay

## 2022-05-09 DIAGNOSIS — N3281 Overactive bladder: Secondary | ICD-10-CM | POA: Diagnosis not present

## 2022-05-09 MED ORDER — TROSPIUM CHLORIDE ER 60 MG PO CP24
1.0000 | ORAL_CAPSULE | Freq: Every day | ORAL | 3 refills | Status: AC
  Filled 2022-05-09: qty 90, 90d supply, fill #0
  Filled 2022-08-16: qty 90, 90d supply, fill #1
  Filled 2023-02-19: qty 90, 90d supply, fill #2

## 2022-05-10 ENCOUNTER — Other Ambulatory Visit (HOSPITAL_COMMUNITY): Payer: Self-pay

## 2022-05-15 ENCOUNTER — Other Ambulatory Visit (HOSPITAL_COMMUNITY): Payer: Self-pay

## 2022-05-15 MED ORDER — HYDROXYZINE HCL 25 MG PO TABS
25.0000 mg | ORAL_TABLET | Freq: Three times a day (TID) | ORAL | 0 refills | Status: DC | PRN
  Filled 2022-05-15: qty 30, 10d supply, fill #0

## 2022-05-17 ENCOUNTER — Other Ambulatory Visit (HOSPITAL_COMMUNITY): Payer: Self-pay

## 2022-06-03 ENCOUNTER — Other Ambulatory Visit (HOSPITAL_COMMUNITY): Payer: Self-pay

## 2022-06-03 DIAGNOSIS — C8400 Mycosis fungoides, unspecified site: Secondary | ICD-10-CM | POA: Diagnosis not present

## 2022-06-03 DIAGNOSIS — M059 Rheumatoid arthritis with rheumatoid factor, unspecified: Secondary | ICD-10-CM | POA: Diagnosis not present

## 2022-06-03 DIAGNOSIS — Z79899 Other long term (current) drug therapy: Secondary | ICD-10-CM | POA: Diagnosis not present

## 2022-06-03 DIAGNOSIS — D509 Iron deficiency anemia, unspecified: Secondary | ICD-10-CM | POA: Diagnosis not present

## 2022-06-03 MED ORDER — FOLIC ACID 1 MG PO TABS
1.0000 mg | ORAL_TABLET | Freq: Every day | ORAL | 0 refills | Status: AC
  Filled 2022-06-03: qty 90, 90d supply, fill #0
  Filled 2022-10-21 (×2): qty 90, 90d supply, fill #1

## 2022-06-03 MED ORDER — HYDROCORTISONE 2.5 % EX CREA
TOPICAL_CREAM | CUTANEOUS | 2 refills | Status: DC
  Filled 2022-06-03 (×2): qty 60, 30d supply, fill #0

## 2022-06-03 MED ORDER — FERROUS SULFATE 325 (65 FE) MG PO TBEC: 1.0000 | DELAYED_RELEASE_TABLET | Freq: Every day | ORAL | 1 refills | Status: AC

## 2022-06-05 ENCOUNTER — Other Ambulatory Visit (HOSPITAL_COMMUNITY): Payer: Self-pay

## 2022-06-05 MED ORDER — HYDROXYCHLOROQUINE SULFATE 200 MG PO TABS
200.0000 mg | ORAL_TABLET | Freq: Every day | ORAL | 1 refills | Status: AC
  Filled 2022-06-05 – 2023-02-10 (×3): qty 90, 90d supply, fill #0

## 2022-06-06 ENCOUNTER — Other Ambulatory Visit (HOSPITAL_COMMUNITY): Payer: Self-pay

## 2022-06-06 IMAGING — CR DG CHEST 2V
2 series · 2 of 2 positions shown · non-contrast
Comparison: 03/12/2018

CLINICAL DATA: Chest pain

EXAM:
CHEST - 2 VIEW

[w chest pa]
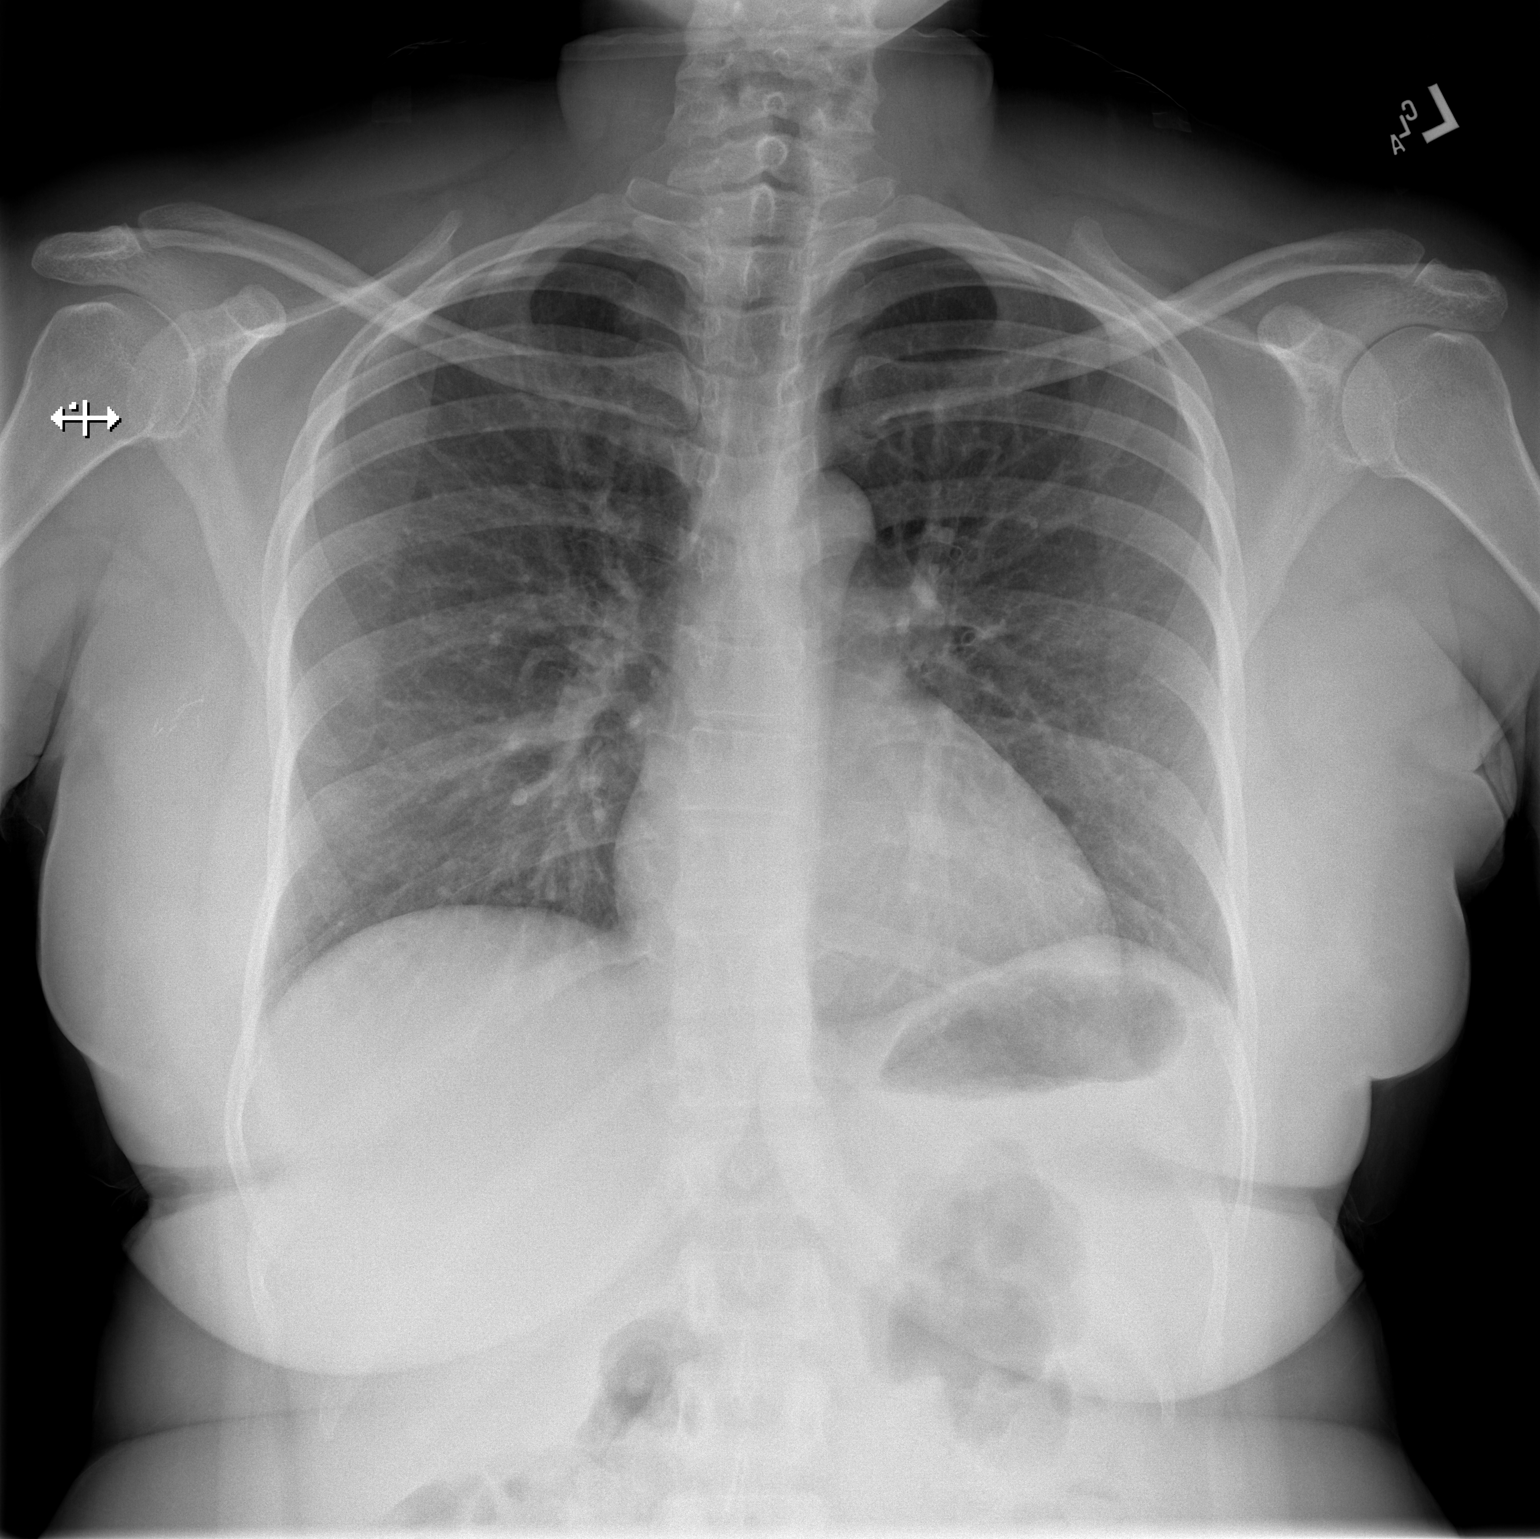

[w chest lat]
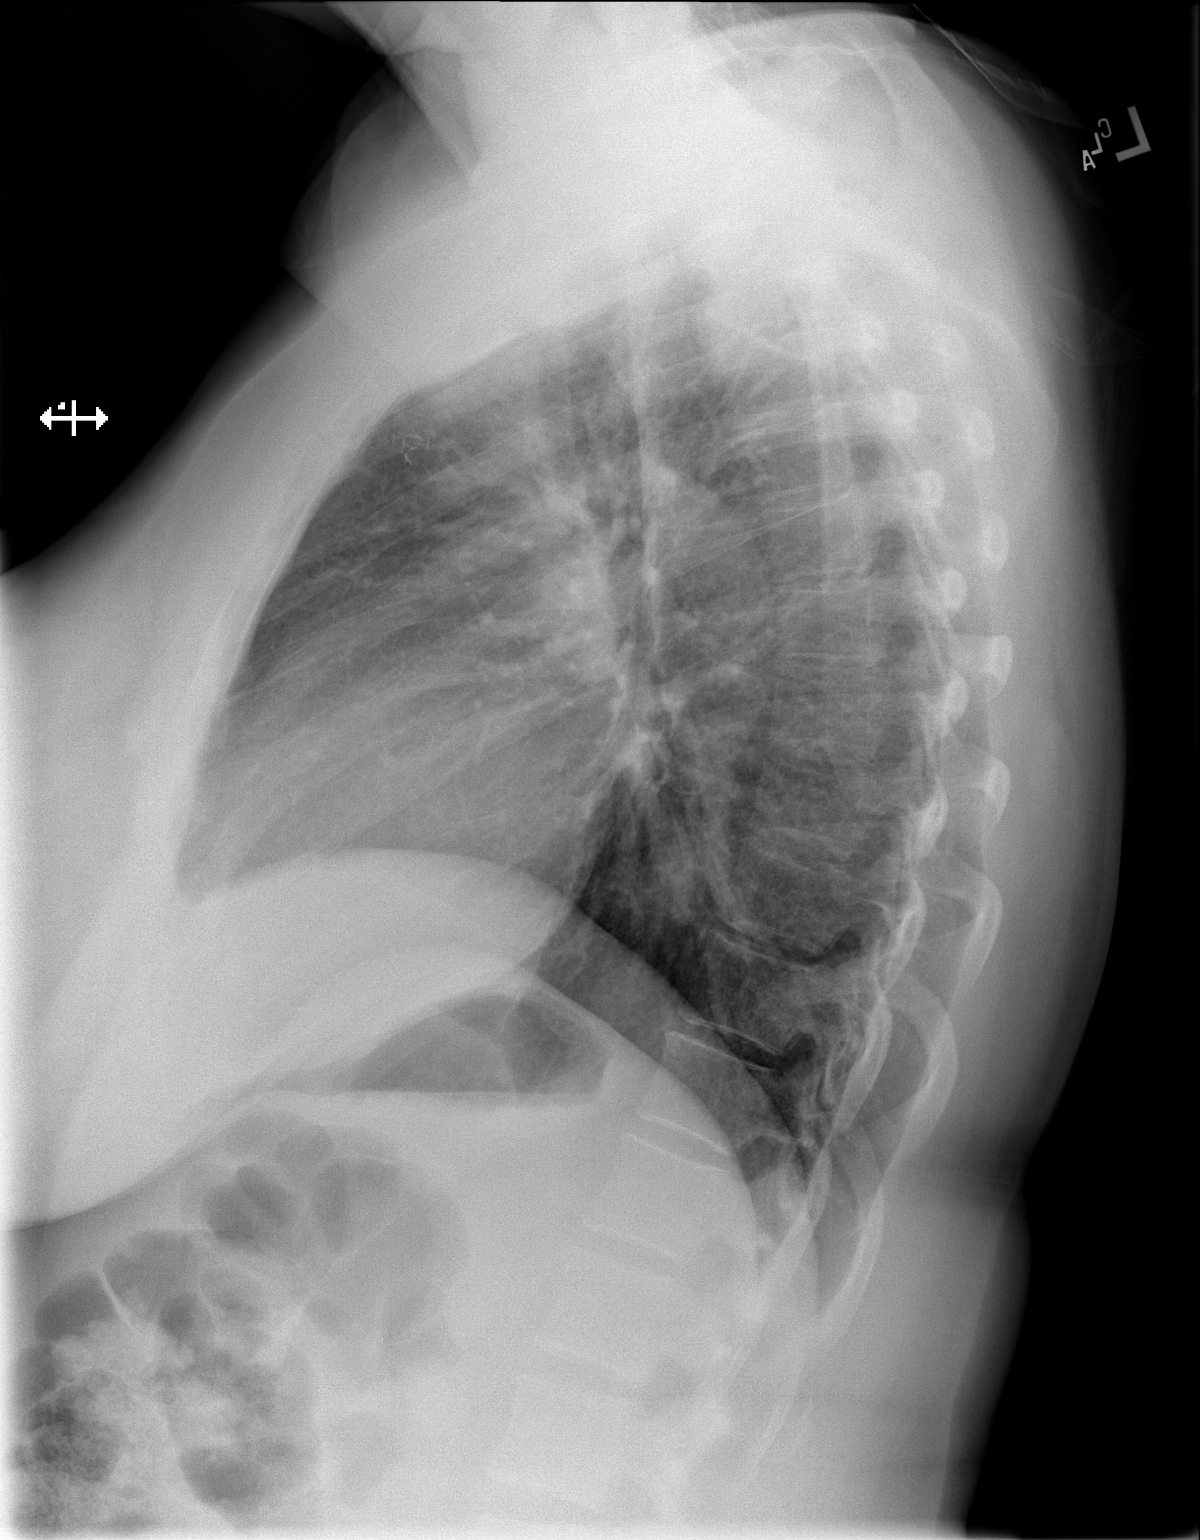

[2 of 2 positions shown; findings below may reference images not displayed]

FINDINGS: The heart size and mediastinal contours are within normal limits.
Both lungs are clear. The visualized skeletal structures are
unremarkable.
IMPRESSION: No active cardiopulmonary disease.

## 2022-06-06 MED ORDER — METHOTREXATE SODIUM CHEMO INJECTION (PF) 50 MG/2ML
20.0000 mg | INTRAMUSCULAR | 1 refills | Status: AC
  Filled 2022-06-06: qty 10, 35d supply, fill #0
  Filled 2022-07-17: qty 10, 35d supply, fill #1

## 2022-06-07 ENCOUNTER — Other Ambulatory Visit (HOSPITAL_COMMUNITY): Payer: Self-pay

## 2022-07-17 ENCOUNTER — Other Ambulatory Visit (HOSPITAL_COMMUNITY): Payer: Self-pay

## 2022-07-18 ENCOUNTER — Other Ambulatory Visit (HOSPITAL_COMMUNITY): Payer: Self-pay

## 2022-07-23 ENCOUNTER — Other Ambulatory Visit (HOSPITAL_COMMUNITY): Payer: Self-pay

## 2022-07-23 DIAGNOSIS — R21 Rash and other nonspecific skin eruption: Secondary | ICD-10-CM | POA: Diagnosis not present

## 2022-07-23 MED ORDER — PREDNISONE 10 MG PO TABS
ORAL_TABLET | ORAL | 0 refills | Status: AC
  Filled 2022-07-23: qty 21, 6d supply, fill #0

## 2022-07-23 MED ORDER — LORATADINE 10 MG PO TABS
10.0000 mg | ORAL_TABLET | Freq: Every day | ORAL | 0 refills | Status: DC
  Filled 2022-07-23: qty 30, 30d supply, fill #0

## 2022-07-23 MED ORDER — FAMOTIDINE 20 MG PO TABS
20.0000 mg | ORAL_TABLET | Freq: Two times a day (BID) | ORAL | 0 refills | Status: DC
  Filled 2022-07-23: qty 60, 30d supply, fill #0

## 2022-07-26 ENCOUNTER — Other Ambulatory Visit (HOSPITAL_COMMUNITY): Payer: Self-pay

## 2022-07-26 ENCOUNTER — Other Ambulatory Visit: Payer: Self-pay

## 2022-07-26 ENCOUNTER — Encounter (HOSPITAL_COMMUNITY): Payer: Self-pay | Admitting: Emergency Medicine

## 2022-07-26 ENCOUNTER — Emergency Department (HOSPITAL_COMMUNITY)
Admission: EM | Admit: 2022-07-26 | Discharge: 2022-07-27 | Disposition: A | Discharge status: Home / Self Care | Payer: 59 | Attending: Emergency Medicine | Admitting: Emergency Medicine

## 2022-07-26 DIAGNOSIS — L299 Pruritus, unspecified: Secondary | ICD-10-CM | POA: Diagnosis not present

## 2022-07-26 HISTORY — DX: Mycosis fungoides, unspecified site: C84.00

## 2022-07-26 MED ORDER — HYDROXYZINE HCL 25 MG PO TABS
25.0000 mg | ORAL_TABLET | Freq: Three times a day (TID) | ORAL | 0 refills | Status: DC | PRN
  Filled 2022-07-26: qty 30, 10d supply, fill #0

## 2022-07-26 NOTE — ED Triage Notes (Signed)
Pt to ED from home c/o whole body itching x6 days.  Pt started amoxicillin and started itching and hives, given IV steroids at urgent care which have resolved but continues to itch.  Pt took benadryl, hydroxyzine, and atarax at home today without relief.  No obvious hives or rash to body, denies trouble breathing and is maintaining secretions.

## 2022-07-27 ENCOUNTER — Other Ambulatory Visit (HOSPITAL_COMMUNITY): Payer: Self-pay

## 2022-07-27 MED ORDER — DEXAMETHASONE SODIUM PHOSPHATE 10 MG/ML IJ SOLN
10.0000 mg | Freq: Once | INTRAMUSCULAR | Status: AC
  Administered 2022-07-27: 10 mg via INTRAMUSCULAR
  Filled 2022-07-27: qty 1

## 2022-07-27 MED ORDER — DEXAMETHASONE SODIUM PHOSPHATE 4 MG/ML IJ SOLN
INTRAMUSCULAR | Status: AC
  Filled 2022-07-27: qty 3

## 2022-07-27 MED ORDER — DIPHENHYDRAMINE-ZINC ACETATE 2-0.1 % EX CREA
1.0000 | TOPICAL_CREAM | Freq: Three times a day (TID) | CUTANEOUS | 0 refills | Status: AC | PRN
  Filled 2022-07-27: qty 28.4, 10d supply, fill #0

## 2022-07-27 NOTE — Discharge Instructions (Signed)
Can continue bendaryl and hydroxyzine every 4-6 hours for itching.  Can use topical benadryl cream as well. Continue your steroids. Can use oatmeal bath or similar to see if this helps soothe the skin-- see attached. Follow-up with your primary care doctor. Return here for new concerns.

## 2022-07-27 NOTE — ED Provider Notes (Signed)
[place] EMERGENCY DEPARTMENT AT [facility] Provider Note   CSN: [id number] Arrival date & time: 07/26/22  2316     History  Chief Complaint  Patient presents with   Pruritis    Erin Clay is a 42 y.o. female.  The history is provided by the patient and medical records.   42 year old female with history of RA, presenting to the ED with bodily itching.  Patient recently returned from [place], was prescribed amoxicillin while there for a UTI.  2 days after starting the medication she broke out in a generalized rash and was seen at urgent care.  She was given injection of Solu-Medrol and started on oral medications.  Rash has since resolved but she continues having bodily itching.  She denies any fever, chills, or sweats.  She has been taking Benadryl, Pepcid, and hydroxyzine without much relief of her itching.  States itching actually seems worse when she gets in the shower and the hot water.  She denies any new soaps, detergents, or other personal care products.  No new foods.  Her only known allergy is sulfasalazine.  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  Home Medications Prior to Admission medications   Medication Sig Start Date End Date Taking? Authorizing Provider  acetaminophen (TYLENOL) 500 MG tablet Take 1,000 mg by mouth every 6 (six) hours as needed for mild pain or headache.    [provider]  clindamycin (CLEOCIN) 150 MG capsule Take 1 capsule (150 mg total) by mouth 4 (four) times daily until gone 09/15/20   Belcher, Ovidio Hanger., DDS  clindamycin (CLEOCIN) 300 MG capsule Take One capsule by mouth every 6 hours for 10 days 08/14/20     doxycycline (VIBRA-TABS) 100 MG tablet Take 1 tablet by mouth 2 times a day for 7 days 11/21/20     famotidine (PEPCID) 20 MG tablet Take 1 tablet (20 mg total) by mouth 2 (two) times daily. 07/23/22     ferrous sulfate 325 (65 FE) MG EC tablet  Take 1 tablet by mouth once a day Patient taking differently: Take 325 mg by mouth daily with breakfast. 05/08/20     ferrous sulfate 325 (65 FE) MG EC tablet Take 1 tablet (325 mg total) by mouth daily with breakfast. 06/03/22     folic acid (FOLVITE) 1 MG tablet Take 1 tablet (1 mg total) by mouth daily. 03/12/22     folic acid (FOLVITE) 1 MG tablet Take 1 tablet (1 mg total) by mouth daily. 06/03/22     hydrocortisone 2.5 % cream Apply as needed for itching to face twice daily. 06/12/20     hydrocortisone 2.5 % cream Apply topically 2 (two) times a day. 06/03/22     hydroxychloroquine (PLAQUENIL) 200 MG tablet Take 1 tablet (200 mg total) by mouth daily. 07/19/20     hydroxychloroquine (PLAQUENIL) 200 MG tablet Take 1 tablet by mouth daily. 02/02/21     hydroxychloroquine (PLAQUENIL) 200 MG tablet Take 1 tablet (200 mg total) by mouth daily. 11/28/21     hydroxychloroquine (PLAQUENIL) 200 MG tablet Take 1 tablet (200 mg total) by mouth daily. 03/14/22     hydroxychloroquine (PLAQUENIL) 200 MG tablet Take 1 tablet (200 mg total) by mouth daily. 06/05/22     hydrOXYzine (ATARAX) 25 MG tablet Take 1 tablet (25 mg total) by mouth 3 (three)  times daily as needed. 11/12/21  hydrOXYzine (ATARAX) 25 MG tablet Take 1 tablet (25 mg total) by mouth 3 (three) times daily as needed for itching 07/26/22     hydrOXYzine (ATARAX/VISTARIL) 25 MG tablet Take 1 tablet (25 mg total) by mouth 3 (three) times daily as needed. 11/19/20     hydrOXYzine (ATARAX/VISTARIL) 50 MG tablet Take 1 tablet (50 mg total) by mouth every 8 (eight) hours as needed for itching. 06/14/20   Merrilee Jansky, MD  loratadine (CLARITIN) 10 MG tablet Take 1 tablet (10 mg total) by mouth daily. 07/23/22     methotrexate 50 MG/2ML injection Inject 1 mL (25 mg total) into the skin once a week. 11/27/21     methotrexate 50 MG/2ML injection Inject 1 mL (25 mg total) into the skin once a week. 11/28/21     methotrexate 50 MG/2ML injection Inject 1 mL (25 mg  total) into the skin once a week.  **Discard vial 28 days after first use.** 03/06/22     methotrexate 50 MG/2ML injection Inject 1 mL (25 mg total) into the skin once a week. 03/14/22     Methotrexate Sodium (METHOTREXATE, PF,) 50 MG/2ML injection Inject  0.8 mls into the skin once a week 07/04/20     Methotrexate Sodium (METHOTREXATE, PF,) 50 MG/2ML injection Inject 0.8 mls into the skin once a week (discard vial after use) 10/16/20     Methotrexate Sodium (METHOTREXATE, PF,) 50 MG/2ML injection Inject 1 ml (25mg ) under the skin once weekly.  Discard vial after use. 02/15/21     Methotrexate Sodium (METHOTREXATE, PF,) 50 MG/2ML injection Inject 0.8 mLs (20 mg) into the muscle once a week (single use vial) 06/05/22     Methotrexate Sodium (METHOTREXATE, PF,) 50 MG/2ML injection Inject 0.103ml (20mg  total) into the skin once a week. Discard remainder in each vial. 09/14/20     mirabegron ER (MYRBETRIQ) 25 MG TB24 tablet Take 1 tablet (25 mg total) by mouth daily. 12/05/21     oxybutynin (DITROPAN-XL) 10 MG 24 hr tablet Take 1 tablet  by mouth daily. 06/04/21     predniSONE (DELTASONE) 10 MG tablet Take 6 tablets by mouth daily for 1 day, THEN decrease by 1 tablet daily until finished (6-5-4-3-2-1) 07/23/22 07/29/22    tolterodine (DETROL LA) 4 MG 24 hr capsule Take 1 capsule (4 mg total) by mouth daily for 30 days. 10/17/21     tranexamic acid (LYSTEDA) 650 MG TABS tablet Take 2 tablets by mouth 3 times daily. 12/06/20     triamcinolone ointment (KENALOG) 0.1 % Apply to body once daily for itching 06/13/20     Trospium Chloride 60 MG CP24 Take 1 capsule (60 mg total) by mouth daily. 02/07/22     Trospium Chloride 60 MG CP24 Take 1 capsule (60 mg total) by mouth daily. 05/09/22         Allergies    Sulfasalazine    Review of Systems   Review of Systems  Constitutional:        Itching  All other systems reviewed and are negative.   Physical Exam Updated Vital Signs BP (!) 154/107 (BP Location: Left Arm)   Pulse  87   Temp 98.1 F (36.7 C) (Oral)   Resp 18   Ht 5\' 7"  (1.702 m)   Wt 79.4 kg   LMP 07/06/2022 (Exact Date)   SpO2 99%   BMI 27.41 kg/m   Physical Exam Vitals and nursing note reviewed.  Constitutional:      Appearance: She is  well-developed.  HENT:     Head: Normocephalic and atraumatic.     Mouth/Throat:     Comments: No lip/tongue swelling, handling secretions well, no stridor Eyes:     General: No scleral icterus.    Conjunctiva/sclera: Conjunctivae normal.     Pupils: Pupils are equal, round, and reactive to light.     Comments: No scleral icterus  Cardiovascular:     Rate and Rhythm: Normal rate and regular rhythm.     Heart sounds: Normal heart sounds.  Pulmonary:     Effort: Pulmonary effort is normal.     Breath sounds: Normal breath sounds.  Abdominal:     General: Bowel sounds are normal.     Palpations: Abdomen is soft.  Musculoskeletal:        General: Normal range of motion.     Cervical back: Normal range of motion.  Skin:    General: Skin is warm and dry.     Comments: Scratching at arms during exam but no bodily rash visualized, no jaundice or other discoloration  Neurological:     Mental Status: She is alert and oriented to person, place, and time.     ED Results / Procedures / Treatments   Labs (all labs ordered are listed, but only abnormal results are displayed) Labs Reviewed - No data to display  EKG None  Radiology No results found.  Procedures Procedures    Medications Ordered in ED Medications  dexamethasone (DECADRON) injection 10 mg (10 mg Intramuscular Given 07/27/22 0105)    ED Course/ Medical Decision Making/ A&P                             Medical Decision Making Risk OTC drugs. Prescription drug management.   42 year old female who with generalized itching.  Rash due to amoxicillin that she was given for UTI, treated at urgent care and rash is resolved but continues to have persistent itching.  She is afebrile  and nontoxic in appearance.  She does not have any visible rash on exam.  She has no scleral icterus and does not appear jaundiced.  She did have recent LFTs that were normal.  She also had a peripheral parasite smear from urgent care a few days ago that was negative as well.  She does report she had improvement after Solu-Medrol at urgent care, however only transient.  Will give dose of IM Decadron now, can continue Benadryl, hydroxyzine, Pepcid.  Can use topical benadryl and other supportive measures (cool compress, oatmeal bath, etc).  Follow-up with PCP.  Return here for new concerns.  Final Clinical Impression(s) / ED Diagnoses Final diagnoses:  Itching    Rx / DC Orders ED Discharge Orders          Ordered    diphenhydrAMINE-zinc acetate (BENADRYL EXTRA STRENGTH) cream  3 times daily PRN        07/27/22 0057              Garlon Hatchet, PA-C 07/27/22 0112    Nira Conn, MD 07/27/22 [phone]

## 2022-08-16 ENCOUNTER — Other Ambulatory Visit (HOSPITAL_COMMUNITY): Payer: Self-pay

## 2022-08-16 ENCOUNTER — Other Ambulatory Visit: Payer: Self-pay

## 2022-08-17 ENCOUNTER — Other Ambulatory Visit (HOSPITAL_COMMUNITY): Payer: Self-pay

## 2022-08-19 ENCOUNTER — Other Ambulatory Visit (HOSPITAL_COMMUNITY): Payer: Self-pay

## 2022-08-20 ENCOUNTER — Other Ambulatory Visit (HOSPITAL_COMMUNITY): Payer: Self-pay

## 2022-09-12 DIAGNOSIS — C8400 Mycosis fungoides, unspecified site: Secondary | ICD-10-CM | POA: Diagnosis not present

## 2022-09-12 DIAGNOSIS — M0579 Rheumatoid arthritis with rheumatoid factor of multiple sites without organ or systems involvement: Secondary | ICD-10-CM | POA: Diagnosis not present

## 2022-09-30 ENCOUNTER — Emergency Department (HOSPITAL_COMMUNITY)
Admission: EM | Admit: 2022-09-30 | Discharge: 2022-10-01 | Disposition: A | Discharge status: Home / Self Care | Payer: 59

## 2022-09-30 ENCOUNTER — Emergency Department (HOSPITAL_COMMUNITY): Payer: 59

## 2022-09-30 ENCOUNTER — Encounter (HOSPITAL_COMMUNITY): Payer: Self-pay | Admitting: Emergency Medicine

## 2022-09-30 DIAGNOSIS — D259 Leiomyoma of uterus, unspecified: Secondary | ICD-10-CM | POA: Diagnosis not present

## 2022-09-30 DIAGNOSIS — R1031 Right lower quadrant pain: Secondary | ICD-10-CM | POA: Insufficient documentation

## 2022-09-30 DIAGNOSIS — R102 Pelvic and perineal pain: Secondary | ICD-10-CM | POA: Diagnosis not present

## 2022-09-30 DIAGNOSIS — R110 Nausea: Secondary | ICD-10-CM | POA: Diagnosis not present

## 2022-09-30 DIAGNOSIS — R1032 Left lower quadrant pain: Secondary | ICD-10-CM | POA: Diagnosis not present

## 2022-09-30 DIAGNOSIS — R1030 Lower abdominal pain, unspecified: Secondary | ICD-10-CM | POA: Diagnosis not present

## 2022-09-30 DIAGNOSIS — N281 Cyst of kidney, acquired: Secondary | ICD-10-CM | POA: Diagnosis not present

## 2022-09-30 LAB — CBC WITH DIFFERENTIAL/PLATELET
Abs Immature Granulocytes: 0 10*3/uL (ref 0.00–0.07)
Basophils Absolute: 0 10*3/uL (ref 0.0–0.1)
Basophils Relative: 1 %
Eosinophils Absolute: 0.1 10*3/uL (ref 0.0–0.5)
Eosinophils Relative: 2 %
HCT: 36.6 % (ref 36.0–46.0)
Hemoglobin: 11.6 g/dL — ABNORMAL LOW (ref 12.0–15.0)
Immature Granulocytes: 0 %
Lymphocytes Relative: 41 %
Lymphs Abs: 2.5 10*3/uL (ref 0.7–4.0)
MCH: 26.5 pg (ref 26.0–34.0)
MCHC: 31.7 g/dL (ref 30.0–36.0)
MCV: 83.6 fL (ref 80.0–100.0)
Monocytes Absolute: 0.6 10*3/uL (ref 0.1–1.0)
Monocytes Relative: 9 %
Neutro Abs: 2.9 10*3/uL (ref 1.7–7.7)
Neutrophils Relative %: 47 %
Platelets: 427 10*3/uL — ABNORMAL HIGH (ref 150–400)
RBC: 4.38 MIL/uL (ref 3.87–5.11)
RDW: 14.1 % (ref 11.5–15.5)
WBC: 6.1 10*3/uL (ref 4.0–10.5)
nRBC: 0 % (ref 0.0–0.2)

## 2022-09-30 LAB — COMPREHENSIVE METABOLIC PANEL
ALT: 15 U/L (ref 0–44)
AST: 26 U/L (ref 15–41)
Albumin: 4.4 g/dL (ref 3.5–5.0)
Alkaline Phosphatase: 60 U/L (ref 38–126)
Anion gap: 12 (ref 5–15)
BUN: 10 mg/dL (ref 6–20)
CO2: 20 mmol/L — ABNORMAL LOW (ref 22–32)
Calcium: 9.5 mg/dL (ref 8.9–10.3)
Chloride: 106 mmol/L (ref 98–111)
Creatinine, Ser: 0.59 mg/dL (ref 0.44–1.00)
GFR, Estimated: >60 mL/min (ref >60)
Glucose, Bld: 111 mg/dL — ABNORMAL HIGH (ref 70–99)
Potassium: 3.2 mmol/L — ABNORMAL LOW (ref 3.5–5.1)
Sodium: 138 mmol/L (ref 135–145)
Total Bilirubin: 0.5 mg/dL (ref 0.3–1.2)
Total Protein: 7.7 g/dL (ref 6.5–8.1)

## 2022-09-30 LAB — URINALYSIS, ROUTINE W REFLEX MICROSCOPIC
Bilirubin Urine: NEGATIVE
Glucose, UA: NEGATIVE mg/dL
Hgb urine dipstick: NEGATIVE
Ketones, ur: NEGATIVE mg/dL
Nitrite: NEGATIVE
Protein, ur: NEGATIVE mg/dL
Specific Gravity, Urine: 1.008 (ref 1.005–1.030)
pH: 8 (ref 5.0–8.0)

## 2022-09-30 LAB — HCG, SERUM, QUALITATIVE: Preg, Serum: NEGATIVE

## 2022-09-30 LAB — LIPASE, BLOOD: Lipase: 38 U/L (ref 11–51)

## 2022-09-30 MED ORDER — SODIUM CHLORIDE (PF) 0.9 % IJ SOLN
INTRAMUSCULAR | Status: AC
  Filled 2022-09-30: qty 50

## 2022-09-30 MED ORDER — MORPHINE SULFATE (PF) 4 MG/ML IV SOLN
4.0000 mg | Freq: Once | INTRAVENOUS | Status: AC
  Administered 2022-09-30: 4 mg via INTRAVENOUS
  Filled 2022-09-30: qty 1

## 2022-09-30 MED ORDER — IOHEXOL 300 MG/ML  SOLN
100.0000 mL | Freq: Once | INTRAMUSCULAR | Status: AC | PRN
  Administered 2022-10-01: 100 mL via INTRAVENOUS

## 2022-09-30 MED ORDER — HYDROMORPHONE HCL 1 MG/ML IJ SOLN: 1.0000 mg | Freq: Once | INTRAMUSCULAR | Status: DC

## 2022-09-30 MED ORDER — ONDANSETRON HCL 4 MG/2ML IJ SOLN
4.0000 mg | Freq: Once | INTRAMUSCULAR | Status: AC
  Administered 2022-09-30: 4 mg via INTRAVENOUS
  Filled 2022-09-30: qty 2

## 2022-09-30 NOTE — ED Provider Notes (Signed)
[place] EMERGENCY DEPARTMENT AT [facility] Provider Note   CSN: [id number] Arrival date & time: 09/30/22  1953     History {Add pertinent medical, surgical, social history, OB history to HPI:1} Chief Complaint  Patient presents with   Abdominal Pain    Erin Clay is a 42 y.o. female.  42 year old female with past medical history of cutaneous B-cell lymphoma and anemia presenting to the emergency department today with lower abdominal pain.  Patient states that she started with some mild lower abdominal pain yesterday.  She states that she recently finished her period and normally does have some mild cramping that she treats with ibuprofen.  She states that she tried this last night which did not seem to work.  She reports she woke up this morning and the pain was little more severe.  She took ibuprofen twice today.  She reports that when she got to work that she started developing severe abdominal pain.  The patient has had some nausea with this but denies any vomiting.  She denies any associated urinary symptoms.  She denies any fevers.  Reports normal bowel movements.  States that the pain is over her entire lower abdomen.   Abdominal Pain Associated symptoms: nausea        Home Medications Prior to Admission medications   Medication Sig Start Date End Date Taking? Authorizing Provider  acetaminophen (TYLENOL) 500 MG tablet Take 1,000 mg by mouth every 6 (six) hours as needed for mild pain or headache.    [provider]  clindamycin (CLEOCIN) 150 MG capsule Take 1 capsule (150 mg total) by mouth 4 (four) times daily until gone 09/15/20   Belcher, Ovidio Hanger., DDS  clindamycin (CLEOCIN) 300 MG capsule Take One capsule by mouth every 6 hours for 10 days 08/14/20     diphenhydrAMINE-zinc acetate (BENADRYL EXTRA STRENGTH) cream Apply 1 Application topically 3 (three) times daily as needed for itching. 07/27/22   Garlon Hatchet, PA-C  doxycycline (VIBRA-TABS) 100  MG tablet Take 1 tablet by mouth 2 times a day for 7 days 11/21/20     ferrous sulfate 325 (65 FE) MG EC tablet Take 1 tablet by mouth once a day Patient taking differently: Take 325 mg by mouth daily with breakfast. 05/08/20     ferrous sulfate 325 (65 FE) MG EC tablet Take 1 tablet (325 mg total) by mouth daily with breakfast. 06/03/22     folic acid (FOLVITE) 1 MG tablet Take 1 tablet (1 mg total) by mouth daily. 03/12/22     folic acid (FOLVITE) 1 MG tablet Take 1 tablet (1 mg total) by mouth daily. 06/03/22     hydrocortisone 2.5 % cream Apply as needed for itching to face twice daily. 06/12/20     hydrocortisone 2.5 % cream Apply topically 2 (two) times a day. 06/03/22     hydroxychloroquine (PLAQUENIL) 200 MG tablet Take 1 tablet (200 mg total) by mouth daily. 07/19/20     hydroxychloroquine (PLAQUENIL) 200 MG tablet Take 1 tablet by mouth daily. 02/02/21     hydroxychloroquine (PLAQUENIL) 200 MG tablet Take 1 tablet (200 mg total) by mouth daily. 11/28/21     hydroxychloroquine (PLAQUENIL) 200 MG tablet Take 1 tablet (200 mg total) by mouth daily. 03/14/22     hydroxychloroquine (PLAQUENIL) 200 MG tablet Take 1 tablet (200 mg total) by mouth daily. 06/05/22     hydrOXYzine (ATARAX) 25 MG tablet Take 1 tablet (25 mg total) by mouth 3 (  three)  times daily as needed. 11/12/21     hydrOXYzine (ATARAX) 25 MG tablet Take 1 tablet (25 mg total) by mouth 3 (three) times daily as needed for itching 07/26/22     hydrOXYzine (ATARAX/VISTARIL) 25 MG tablet Take 1 tablet (25 mg total) by mouth 3 (three) times daily as needed. 11/19/20     hydrOXYzine (ATARAX/VISTARIL) 50 MG tablet Take 1 tablet (50 mg total) by mouth every 8 (eight) hours as needed for itching. 06/14/20   Merrilee Jansky, MD  loratadine (CLARITIN) 10 MG tablet Take 1 tablet (10 mg total) by mouth daily. 07/23/22     methotrexate 50 MG/2ML injection Inject 1 mL (25 mg total) into the skin once a week. 11/27/21     methotrexate 50 MG/2ML injection Inject  1 mL (25 mg total) into the skin once a week. 11/28/21     methotrexate 50 MG/2ML injection Inject 1 mL (25 mg total) into the skin once a week.  **Discard vial 28 days after first use.** 03/06/22     methotrexate 50 MG/2ML injection Inject 1 mL (25 mg total) into the skin once a week. 03/14/22     Methotrexate Sodium (METHOTREXATE, PF,) 50 MG/2ML injection Inject  0.8 mls into the skin once a week 07/04/20     Methotrexate Sodium (METHOTREXATE, PF,) 50 MG/2ML injection Inject 0.8 mls into the skin once a week (discard vial after use) 10/16/20     Methotrexate Sodium (METHOTREXATE, PF,) 50 MG/2ML injection Inject 1 ml (25mg ) under the skin once weekly.  Discard vial after use. 02/15/21     Methotrexate Sodium (METHOTREXATE, PF,) 50 MG/2ML injection Inject 0.8 mLs (20 mg) into the muscle once a week (single use vial) 06/05/22     Methotrexate Sodium (METHOTREXATE, PF,) 50 MG/2ML injection Inject 0.29ml (20mg  total) into the skin once a week. Discard remainder in each vial. 09/14/20     mirabegron ER (MYRBETRIQ) 25 MG TB24 tablet Take 1 tablet (25 mg total) by mouth daily. 12/05/21     oxybutynin (DITROPAN-XL) 10 MG 24 hr tablet Take 1 tablet  by mouth daily. 06/04/21     tolterodine (DETROL LA) 4 MG 24 hr capsule Take 1 capsule (4 mg total) by mouth daily for 30 days. 10/17/21     tranexamic acid (LYSTEDA) 650 MG TABS tablet Take 2 tablets by mouth 3 times daily. 12/06/20     triamcinolone ointment (KENALOG) 0.1 % Apply to body once daily for itching 06/13/20     Trospium Chloride 60 MG CP24 Take 1 capsule (60 mg total) by mouth daily. 02/07/22     Trospium Chloride 60 MG CP24 Take 1 capsule (60 mg total) by mouth daily. 05/09/22     famotidine (PEPCID) 20 MG tablet Take 1 tablet (20 mg total) by mouth 2 (two) times daily. 07/23/22 09/12/22        Allergies    Sulfasalazine    Review of Systems   Review of Systems  Gastrointestinal:  Positive for abdominal pain and nausea.  All other systems reviewed and are  negative.   Physical Exam Updated Vital Signs BP (!) 142/123 (BP Location: Right Arm)   Pulse 96   Temp 98.3 F (36.8 C) (Oral)   Resp 20   LMP 09/24/2022 (Exact Date)   SpO2 100%  Physical Exam Vitals and nursing note reviewed.   Gen: Appears uncomfortable Eyes: PERRL, EOMI HEENT: no oropharyngeal swelling Neck: trachea midline Resp: clear to auscultation bilaterally Card: RRR, no murmurs,  rubs, or gallops Abd: Tender over the right and left lower quadrants with maximal tenderness over right lower quadrant Extremities: no calf tenderness, no edema Vascular: 2+ radial pulses bilaterally, 2+ DP pulses bilaterally Skin: no rashes Psyc: acting appropriately   ED Results / Procedures / Treatments   Labs (all labs ordered are listed, but only abnormal results are displayed) Labs Reviewed  LIPASE, BLOOD  COMPREHENSIVE METABOLIC PANEL  URINALYSIS, ROUTINE W REFLEX MICROSCOPIC  HCG, SERUM, QUALITATIVE  CBC WITH DIFFERENTIAL/PLATELET    EKG None  Radiology No results found.  Procedures Procedures  {Document cardiac monitor, telemetry assessment procedure when appropriate:1}  Medications Ordered in ED Medications  ondansetron (ZOFRAN) injection 4 mg (has no administration in time range)  morphine (PF) 4 MG/ML injection 4 mg (has no administration in time range)    ED Course/ Medical Decision Making/ A&P   {   Click here for ABCD2, HEART and other calculatorsREFRESH Note before signing :1}                              Medical Decision Making 42 year old female with past medical history of cutaneous lymphoma and anemia presenting to the emergency department today with lower abdominal pain.  I will further evaluate the patient here with basic labs including LFTs and lipase to evaluate for hepatobiliary pathology or pancreatitis.  Will obtain a pregnancy test here to evaluate for intrauterine versus ectopic pregnancy.  Given her diffuse lower abdominal pain I will  obtain a CT scan to evaluate for appendicitis, perforated viscus, diverticulitis, ureterolithiasis, or GYN pathology.  I will give patient morphine Zofran for symptoms and reevaluate for ultimate disposition.  Amount and/or Complexity of Data Reviewed Labs: ordered.  Risk Prescription drug management.   ***  {Document critical care time when appropriate:1} {Document review of labs and clinical decision tools ie heart score, Chads2Vasc2 etc:1}  {Document your independent review of radiology images, and any outside records:1} {Document your discussion with family members, caretakers, and with consultants:1} {Document social determinants of health affecting pt's care:1} {Document your decision making why or why not admission, treatments were needed:1} Final Clinical Impression(s) / ED Diagnoses Final diagnoses:  None    Rx / DC Orders ED Discharge Orders     None

## 2022-09-30 NOTE — ED Triage Notes (Signed)
Pt arriving with severe lower abdominal pain and nausea. Pt reports this started earlier this morning, she took ibuprofen, but the pain has only gotten worse. Pt has hx of fibroids.

## 2022-10-01 ENCOUNTER — Emergency Department (HOSPITAL_COMMUNITY): Payer: 59

## 2022-10-01 DIAGNOSIS — R1030 Lower abdominal pain, unspecified: Secondary | ICD-10-CM | POA: Diagnosis not present

## 2022-10-01 DIAGNOSIS — D259 Leiomyoma of uterus, unspecified: Secondary | ICD-10-CM | POA: Diagnosis not present

## 2022-10-01 DIAGNOSIS — N281 Cyst of kidney, acquired: Secondary | ICD-10-CM | POA: Diagnosis not present

## 2022-10-01 DIAGNOSIS — R102 Pelvic and perineal pain: Secondary | ICD-10-CM | POA: Diagnosis not present

## 2022-10-01 LAB — WET PREP, GENITAL
Clue Cells Wet Prep HPF POC: NONE SEEN
Sperm: NONE SEEN
Trich, Wet Prep: NONE SEEN
WBC, Wet Prep HPF POC: <10 (ref <10)
Yeast Wet Prep HPF POC: NONE SEEN

## 2022-10-01 MED ORDER — KETOROLAC TROMETHAMINE 15 MG/ML IJ SOLN
15.0000 mg | Freq: Once | INTRAMUSCULAR | Status: AC
  Administered 2022-10-01: 15 mg via INTRAVENOUS
  Filled 2022-10-01: qty 1

## 2022-10-01 NOTE — ED Notes (Signed)
Called lab to add on urine culture - Velna Hatchet in lab advises she will look for sample sent down earlier in pts visit.

## 2022-10-01 NOTE — ED Provider Notes (Signed)
I assumed care of this patient from previous provider.  Please see their note for further details of history, exam, and MDM.   Briefly patient is a 42 y.o. female who presented with lower abdominal pain.  Labs reassuring.  Currently pending CT.  CT negative for serious intra-abdominal inflammatory/infectious process. Pelvic exam with mild discharge.  Wet prep negative.  Not consistent with cervicitis.  GC/chlamydia sent.  No empiric treatment needed at this time. UA with leukocytes but not convincing for infection.  Culture sent.  Ultrasound notable for uterine fibroids.  Unable to visualize the ovaries.  Pain improved with Toradol.  Unlikely torsion.  The patient appears reasonably screened and/or stabilized for discharge and I doubt any other medical condition or other [facility] requiring further screening, evaluation, or treatment in the ED at this time. I have discussed the findings, Dx and Tx plan with the patient/family who expressed understanding and agree(s) with the plan. Discharge instructions discussed at length. The patient/family was given strict return precautions who verbalized understanding of the instructions. No further questions at time of discharge.  Disposition: Discharge  Condition: Good  ED Discharge Orders     None         Follow Up: Primary care provider  Call  to schedule an appointment for close follow up         Demitrious Mccannon, Amadeo Garnet, MD 10/01/22 [phone]

## 2022-10-02 LAB — URINE CULTURE: Culture: <10000 — AB

## 2022-10-03 LAB — GC/CHLAMYDIA PROBE AMP (~~LOC~~) NOT AT ARMC
Chlamydia: NEGATIVE
Comment: NEGATIVE
Comment: NORMAL
Neisseria Gonorrhea: NEGATIVE

## 2022-10-21 ENCOUNTER — Other Ambulatory Visit (HOSPITAL_COMMUNITY): Payer: Self-pay

## 2022-10-21 MED ORDER — HYDROXYZINE HCL 25 MG PO TABS
25.0000 mg | ORAL_TABLET | Freq: Three times a day (TID) | ORAL | 0 refills | Status: DC | PRN
  Filled 2022-10-21: qty 30, 10d supply, fill #0

## 2022-10-22 ENCOUNTER — Other Ambulatory Visit (HOSPITAL_COMMUNITY): Payer: Self-pay

## 2022-11-20 ENCOUNTER — Other Ambulatory Visit (HOSPITAL_COMMUNITY): Payer: Self-pay

## 2022-11-21 ENCOUNTER — Other Ambulatory Visit (HOSPITAL_COMMUNITY): Payer: Self-pay

## 2022-11-22 ENCOUNTER — Other Ambulatory Visit (HOSPITAL_COMMUNITY): Payer: Self-pay

## 2022-11-22 DIAGNOSIS — C8400 Mycosis fungoides, unspecified site: Secondary | ICD-10-CM | POA: Diagnosis not present

## 2022-11-22 DIAGNOSIS — D509 Iron deficiency anemia, unspecified: Secondary | ICD-10-CM | POA: Diagnosis not present

## 2022-11-22 DIAGNOSIS — M0579 Rheumatoid arthritis with rheumatoid factor of multiple sites without organ or systems involvement: Secondary | ICD-10-CM | POA: Diagnosis not present

## 2022-12-06 DIAGNOSIS — C8400 Mycosis fungoides, unspecified site: Secondary | ICD-10-CM | POA: Diagnosis not present

## 2022-12-06 DIAGNOSIS — M0579 Rheumatoid arthritis with rheumatoid factor of multiple sites without organ or systems involvement: Secondary | ICD-10-CM | POA: Diagnosis not present

## 2022-12-24 DIAGNOSIS — Z3169 Encounter for other general counseling and advice on procreation: Secondary | ICD-10-CM | POA: Diagnosis not present

## 2022-12-24 DIAGNOSIS — Z3141 Encounter for fertility testing: Secondary | ICD-10-CM | POA: Diagnosis not present

## 2022-12-25 DIAGNOSIS — Z3141 Encounter for fertility testing: Secondary | ICD-10-CM | POA: Diagnosis not present

## 2022-12-31 DIAGNOSIS — Z3141 Encounter for fertility testing: Secondary | ICD-10-CM | POA: Diagnosis not present

## 2023-01-06 ENCOUNTER — Other Ambulatory Visit (HOSPITAL_COMMUNITY): Payer: Self-pay

## 2023-01-06 DIAGNOSIS — Z3141 Encounter for fertility testing: Secondary | ICD-10-CM | POA: Diagnosis not present

## 2023-01-06 DIAGNOSIS — Z3200 Encounter for pregnancy test, result unknown: Secondary | ICD-10-CM | POA: Diagnosis not present

## 2023-01-06 MED ORDER — DOXYCYCLINE HYCLATE 100 MG PO TABS
100.0000 mg | ORAL_TABLET | Freq: Two times a day (BID) | ORAL | 0 refills | Status: AC
  Filled 2023-01-06: qty 10, 5d supply, fill #0

## 2023-01-07 ENCOUNTER — Other Ambulatory Visit (HOSPITAL_COMMUNITY): Payer: Self-pay

## 2023-02-10 ENCOUNTER — Other Ambulatory Visit: Payer: Self-pay

## 2023-02-10 ENCOUNTER — Other Ambulatory Visit (HOSPITAL_COMMUNITY): Payer: Self-pay

## 2023-02-10 MED ORDER — HYDROXYZINE HCL 25 MG PO TABS
ORAL_TABLET | ORAL | 0 refills | Status: DC
  Filled 2023-02-10: qty 30, 10d supply, fill #0

## 2023-02-19 ENCOUNTER — Other Ambulatory Visit (HOSPITAL_COMMUNITY): Payer: Self-pay

## 2023-02-20 ENCOUNTER — Other Ambulatory Visit: Payer: Self-pay

## 2023-02-20 ENCOUNTER — Other Ambulatory Visit (HOSPITAL_COMMUNITY): Payer: Self-pay

## 2023-02-27 DIAGNOSIS — Z3141 Encounter for fertility testing: Secondary | ICD-10-CM | POA: Diagnosis not present

## 2023-02-28 ENCOUNTER — Other Ambulatory Visit (HOSPITAL_COMMUNITY): Payer: Self-pay

## 2023-02-28 DIAGNOSIS — C8400 Mycosis fungoides, unspecified site: Secondary | ICD-10-CM | POA: Diagnosis not present

## 2023-02-28 DIAGNOSIS — Z79899 Other long term (current) drug therapy: Secondary | ICD-10-CM | POA: Diagnosis not present

## 2023-02-28 DIAGNOSIS — M059 Rheumatoid arthritis with rheumatoid factor, unspecified: Secondary | ICD-10-CM | POA: Diagnosis not present

## 2023-02-28 MED ORDER — HYDROXYCHLOROQUINE SULFATE 200 MG PO TABS
400.0000 mg | ORAL_TABLET | Freq: Every day | ORAL | 1 refills | Status: AC
Start: 2023-02-28 — End: ?
  Filled 2023-02-28 – 2023-04-08 (×3): qty 180, 90d supply, fill #0
  Filled 2023-07-04: qty 180, 90d supply, fill #1

## 2023-03-04 DIAGNOSIS — Z3169 Encounter for other general counseling and advice on procreation: Secondary | ICD-10-CM | POA: Diagnosis not present

## 2023-04-03 DIAGNOSIS — Z6834 Body mass index (BMI) 34.0-34.9, adult: Secondary | ICD-10-CM | POA: Diagnosis not present

## 2023-04-03 DIAGNOSIS — N946 Dysmenorrhea, unspecified: Secondary | ICD-10-CM | POA: Diagnosis not present

## 2023-04-03 DIAGNOSIS — M069 Rheumatoid arthritis, unspecified: Secondary | ICD-10-CM | POA: Diagnosis not present

## 2023-04-03 DIAGNOSIS — D219 Benign neoplasm of connective and other soft tissue, unspecified: Secondary | ICD-10-CM | POA: Diagnosis not present

## 2023-04-07 ENCOUNTER — Other Ambulatory Visit (HOSPITAL_COMMUNITY): Payer: Self-pay

## 2023-04-07 ENCOUNTER — Other Ambulatory Visit (HOSPITAL_BASED_OUTPATIENT_CLINIC_OR_DEPARTMENT_OTHER): Payer: Self-pay

## 2023-04-08 ENCOUNTER — Other Ambulatory Visit (HOSPITAL_COMMUNITY): Payer: Self-pay

## 2023-05-06 ENCOUNTER — Other Ambulatory Visit (HOSPITAL_COMMUNITY): Payer: Self-pay

## 2023-05-06 DIAGNOSIS — N80311 Superficial endometriosis of the anterior cul-de-sac: Secondary | ICD-10-CM | POA: Diagnosis not present

## 2023-05-06 DIAGNOSIS — D250 Submucous leiomyoma of uterus: Secondary | ICD-10-CM | POA: Diagnosis not present

## 2023-05-06 DIAGNOSIS — N736 Female pelvic peritoneal adhesions (postinfective): Secondary | ICD-10-CM | POA: Diagnosis not present

## 2023-05-06 DIAGNOSIS — N841 Polyp of cervix uteri: Secondary | ICD-10-CM | POA: Diagnosis not present

## 2023-05-06 DIAGNOSIS — N920 Excessive and frequent menstruation with regular cycle: Secondary | ICD-10-CM | POA: Diagnosis not present

## 2023-05-06 DIAGNOSIS — N838 Other noninflammatory disorders of ovary, fallopian tube and broad ligament: Secondary | ICD-10-CM | POA: Diagnosis not present

## 2023-05-06 DIAGNOSIS — N971 Female infertility of tubal origin: Secondary | ICD-10-CM | POA: Diagnosis not present

## 2023-05-06 DIAGNOSIS — N80A0 Endometriosis of bladder, unspecified depth: Secondary | ICD-10-CM | POA: Diagnosis not present

## 2023-05-06 DIAGNOSIS — N8001 Superficial endometriosis of the uterus: Secondary | ICD-10-CM | POA: Diagnosis not present

## 2023-05-06 DIAGNOSIS — D251 Intramural leiomyoma of uterus: Secondary | ICD-10-CM | POA: Diagnosis not present

## 2023-05-06 DIAGNOSIS — D259 Leiomyoma of uterus, unspecified: Secondary | ICD-10-CM | POA: Diagnosis not present

## 2023-05-06 DIAGNOSIS — N803C2 Endometriosis of the left uterosacral ligament, unspecified depth: Secondary | ICD-10-CM | POA: Diagnosis not present

## 2023-05-06 MED ORDER — OXYCODONE HCL 5 MG PO TABS
ORAL_TABLET | ORAL | 0 refills | Status: AC
Start: 2023-05-06 — End: ?
  Filled 2023-05-06: qty 15, 3d supply, fill #0

## 2023-05-06 MED ORDER — ACETAMINOPHEN 325 MG PO TABS
ORAL_TABLET | ORAL | 0 refills | Status: AC
  Filled 2023-05-06: qty 30, 4d supply, fill #0

## 2023-05-06 MED ORDER — ONDANSETRON 4 MG PO TBDP
ORAL_TABLET | ORAL | 0 refills | Status: AC
  Filled 2023-05-06: qty 20, 7d supply, fill #0

## 2023-05-06 MED ORDER — SENNOSIDES-DOCUSATE SODIUM 8.6-50 MG PO TABS
1.0000 | ORAL_TABLET | Freq: Every day | ORAL | 0 refills | Status: AC
  Filled 2023-05-06: qty 30, 30d supply, fill #0

## 2023-05-06 MED ORDER — IBUPROFEN 600 MG PO TABS
600.0000 mg | ORAL_TABLET | Freq: Four times a day (QID) | ORAL | 0 refills | Status: AC
  Filled 2023-05-06: qty 30, 8d supply, fill #0

## 2023-05-07 ENCOUNTER — Other Ambulatory Visit (HOSPITAL_COMMUNITY): Payer: Self-pay

## 2023-05-07 DIAGNOSIS — N80311 Superficial endometriosis of the anterior cul-de-sac: Secondary | ICD-10-CM | POA: Diagnosis not present

## 2023-05-07 DIAGNOSIS — N971 Female infertility of tubal origin: Secondary | ICD-10-CM | POA: Diagnosis not present

## 2023-05-07 DIAGNOSIS — N841 Polyp of cervix uteri: Secondary | ICD-10-CM | POA: Diagnosis not present

## 2023-05-07 DIAGNOSIS — N838 Other noninflammatory disorders of ovary, fallopian tube and broad ligament: Secondary | ICD-10-CM | POA: Diagnosis not present

## 2023-05-07 DIAGNOSIS — N736 Female pelvic peritoneal adhesions (postinfective): Secondary | ICD-10-CM | POA: Diagnosis not present

## 2023-05-07 DIAGNOSIS — D250 Submucous leiomyoma of uterus: Secondary | ICD-10-CM | POA: Diagnosis not present

## 2023-05-07 DIAGNOSIS — D259 Leiomyoma of uterus, unspecified: Secondary | ICD-10-CM | POA: Diagnosis not present

## 2023-05-07 DIAGNOSIS — N8001 Superficial endometriosis of the uterus: Secondary | ICD-10-CM | POA: Diagnosis not present

## 2023-05-07 DIAGNOSIS — N803C2 Endometriosis of the left uterosacral ligament, unspecified depth: Secondary | ICD-10-CM | POA: Diagnosis not present

## 2023-05-07 DIAGNOSIS — N920 Excessive and frequent menstruation with regular cycle: Secondary | ICD-10-CM | POA: Diagnosis not present

## 2023-05-09 ENCOUNTER — Other Ambulatory Visit (HOSPITAL_COMMUNITY): Payer: Self-pay

## 2023-05-12 ENCOUNTER — Other Ambulatory Visit (HOSPITAL_COMMUNITY): Payer: Self-pay

## 2023-05-12 MED ORDER — HYDROXYZINE HCL 25 MG PO TABS
25.0000 mg | ORAL_TABLET | Freq: Three times a day (TID) | ORAL | 0 refills | Status: DC | PRN
  Filled 2023-05-12: qty 30, 10d supply, fill #0

## 2023-05-12 MED ORDER — FOLIC ACID 1 MG PO TABS
1.0000 mg | ORAL_TABLET | Freq: Every day | ORAL | 0 refills | Status: AC
  Filled 2023-05-12: qty 90, 90d supply, fill #0
  Filled 2023-11-28: qty 90, 90d supply, fill #1

## 2023-05-21 DIAGNOSIS — R350 Frequency of micturition: Secondary | ICD-10-CM | POA: Diagnosis not present

## 2023-06-02 DIAGNOSIS — C8400 Mycosis fungoides, unspecified site: Secondary | ICD-10-CM | POA: Diagnosis not present

## 2023-06-19 ENCOUNTER — Other Ambulatory Visit (HOSPITAL_COMMUNITY): Payer: Self-pay

## 2023-06-19 MED ORDER — NORETHINDRONE ACETATE 5 MG PO TABS
2.5000 mg | ORAL_TABLET | Freq: Every day | ORAL | 3 refills | Status: AC
  Filled 2023-06-19: qty 30, 60d supply, fill #0
  Filled 2023-08-18: qty 30, 60d supply, fill #1
  Filled 2023-10-15: qty 30, 60d supply, fill #2
  Filled 2024-01-01: qty 30, 60d supply, fill #3

## 2023-07-02 ENCOUNTER — Other Ambulatory Visit (HOSPITAL_COMMUNITY): Payer: Self-pay

## 2023-07-02 DIAGNOSIS — R350 Frequency of micturition: Secondary | ICD-10-CM | POA: Diagnosis not present

## 2023-07-02 DIAGNOSIS — N3281 Overactive bladder: Secondary | ICD-10-CM | POA: Diagnosis not present

## 2023-07-02 MED ORDER — OXYBUTYNIN CHLORIDE ER 15 MG PO TB24
15.0000 mg | ORAL_TABLET | Freq: Every day | ORAL | 3 refills | Status: AC
  Filled 2023-07-02: qty 90, 90d supply, fill #0
  Filled 2023-10-01: qty 90, 90d supply, fill #1
  Filled 2023-10-01: qty 80, 80d supply, fill #1
  Filled 2024-01-01: qty 90, 90d supply, fill #2

## 2023-07-04 ENCOUNTER — Other Ambulatory Visit (HOSPITAL_COMMUNITY): Payer: Self-pay

## 2023-07-07 ENCOUNTER — Other Ambulatory Visit (HOSPITAL_COMMUNITY): Payer: Self-pay

## 2023-07-08 ENCOUNTER — Other Ambulatory Visit (HOSPITAL_COMMUNITY): Payer: Self-pay

## 2023-07-08 MED ORDER — HYDROXYZINE HCL 25 MG PO TABS
25.0000 mg | ORAL_TABLET | Freq: Three times a day (TID) | ORAL | 0 refills | Status: DC | PRN
  Filled 2023-07-08: qty 30, 10d supply, fill #0

## 2023-08-18 ENCOUNTER — Other Ambulatory Visit (HOSPITAL_COMMUNITY): Payer: Self-pay

## 2023-09-23 ENCOUNTER — Other Ambulatory Visit (HOSPITAL_COMMUNITY): Payer: Self-pay

## 2023-09-23 MED ORDER — HYDROXYZINE HCL 25 MG PO TABS
25.0000 mg | ORAL_TABLET | Freq: Three times a day (TID) | ORAL | 0 refills | Status: DC | PRN
  Filled 2023-09-23: qty 30, 10d supply, fill #0

## 2023-10-01 ENCOUNTER — Other Ambulatory Visit: Payer: Self-pay

## 2023-10-01 ENCOUNTER — Other Ambulatory Visit (HOSPITAL_COMMUNITY): Payer: Self-pay

## 2023-10-02 ENCOUNTER — Other Ambulatory Visit (HOSPITAL_COMMUNITY): Payer: Self-pay

## 2023-10-02 DIAGNOSIS — N3281 Overactive bladder: Secondary | ICD-10-CM | POA: Diagnosis not present

## 2023-10-03 ENCOUNTER — Other Ambulatory Visit (HOSPITAL_COMMUNITY): Payer: Self-pay

## 2023-10-03 ENCOUNTER — Other Ambulatory Visit: Payer: Self-pay

## 2023-10-04 ENCOUNTER — Other Ambulatory Visit (HOSPITAL_COMMUNITY): Payer: Self-pay

## 2023-10-15 ENCOUNTER — Other Ambulatory Visit (HOSPITAL_COMMUNITY): Payer: Self-pay

## 2023-10-16 ENCOUNTER — Other Ambulatory Visit (HOSPITAL_COMMUNITY): Payer: Self-pay

## 2023-10-16 ENCOUNTER — Other Ambulatory Visit: Payer: Self-pay

## 2023-10-16 DIAGNOSIS — C8400 Mycosis fungoides, unspecified site: Secondary | ICD-10-CM | POA: Diagnosis not present

## 2023-10-16 DIAGNOSIS — Z79899 Other long term (current) drug therapy: Secondary | ICD-10-CM | POA: Diagnosis not present

## 2023-10-16 DIAGNOSIS — M0579 Rheumatoid arthritis with rheumatoid factor of multiple sites without organ or systems involvement: Secondary | ICD-10-CM | POA: Diagnosis not present

## 2023-10-16 DIAGNOSIS — M059 Rheumatoid arthritis with rheumatoid factor, unspecified: Secondary | ICD-10-CM | POA: Diagnosis not present

## 2023-10-16 MED ORDER — PREDNISONE 5 MG PO TABS
ORAL_TABLET | ORAL | 0 refills | Status: DC
  Filled 2023-10-16: qty 30, 15d supply, fill #0

## 2023-10-16 MED ORDER — HYDROXYCHLOROQUINE SULFATE 200 MG PO TABS
400.0000 mg | ORAL_TABLET | Freq: Every day | ORAL | 1 refills | Status: AC
  Filled 2023-10-16: qty 180, 90d supply, fill #0
  Filled 2024-01-27: qty 180, 90d supply, fill #1

## 2023-10-17 ENCOUNTER — Other Ambulatory Visit: Payer: Self-pay

## 2023-11-18 DIAGNOSIS — Z79899 Other long term (current) drug therapy: Secondary | ICD-10-CM | POA: Diagnosis not present

## 2023-11-18 DIAGNOSIS — M0579 Rheumatoid arthritis with rheumatoid factor of multiple sites without organ or systems involvement: Secondary | ICD-10-CM | POA: Diagnosis not present

## 2023-11-18 DIAGNOSIS — C8400 Mycosis fungoides, unspecified site: Secondary | ICD-10-CM | POA: Diagnosis not present

## 2023-11-28 ENCOUNTER — Other Ambulatory Visit (HOSPITAL_COMMUNITY): Payer: Self-pay

## 2023-11-28 ENCOUNTER — Other Ambulatory Visit: Payer: Self-pay

## 2023-11-28 MED ORDER — HYDROXYZINE HCL 25 MG PO TABS
25.0000 mg | ORAL_TABLET | Freq: Three times a day (TID) | ORAL | 0 refills | Status: AC | PRN
  Filled 2023-11-28: qty 30, 10d supply, fill #0

## 2023-12-29 ENCOUNTER — Encounter: Payer: Self-pay | Admitting: Physician Assistant

## 2023-12-29 ENCOUNTER — Ambulatory Visit: Admitting: Physician Assistant

## 2023-12-29 DIAGNOSIS — M722 Plantar fascial fibromatosis: Secondary | ICD-10-CM | POA: Diagnosis not present

## 2023-12-29 NOTE — Progress Notes (Signed)
 Office Visit Note   Patient: Erin Clay           Date of Birth: 09-Sep-1980           MRN: [id number] Visit Date: 12/29/2023              Requested by: No referring provider defined for this encounter. PCP: Pcp, No  Chief Complaint  Patient presents with   Right Foot - Pain   Left Foot - Pain      HPI: 43 y/o female with chief complaint of bilateral heel pain.  It is worse in the am or after sitting for a while then getting up to walk.  The pain radiates up to the knees in the posterior lower leg.  She has taken ibuprofen , tylenol  topicals, and heat.  The pain has not improved over the past 3 months.  She does wear a walking NB tennis shoe for work.  She is an [employer].      Assessment & Plan: Visit Diagnoses:  1. Plantar fasciitis, bilateral     Plan: Heel raise/cup to elevate the heels and relieve pressure on the heels.  Gastrocnemius stretches.  Ice and OTC antiinflammatories/tylenol  PRN.  I will refer her to Dr. Burnetta for shock wave therapy.  She may benefit from orthotics in the future to protect her pes planus and give her arch support.    Follow-Up Instructions: Return if symptoms worsen or fail to improve.   Ortho Exam  Patient is alert, oriented, no adenopathy, well-dressed, normal affect, normal respiratory effort. Tight achilles B LE to neutral.  Palpable DP pulses B.  No edema or cellulitis.  Tenderness to palpation medial plantar heel B LE.  Grossly ankle and knee strength 5/5.  Pes planus B LE.  Her heels correct with toe raises B.      Imaging: No results found. No images are attached to the encounter.  Labs: Lab Results  Component Value Date   ESRSEDRATE 12 03/12/2018   CRP 7.6 (H) 03/12/2018   REPTSTATUS 10/02/2022 FINAL 10/01/2022   CULT (A) 10/01/2022    <10,000 COLONIES/mL INSIGNIFICANT GROWTH Performed at [facility], [address] [place], [place] [address]      Lab Results  Component Value Date   ALBUMIN 4.4 09/30/2022    ALBUMIN 4.6 06/10/2020   ALBUMIN 2.7 (L) 03/19/2018    Lab Results  Component Value Date   MG 2.3 03/12/2018   No results found for: VD25OH  No results found for: PREALBUMIN    Latest Ref Rng & Units 09/30/2022    8:41 PM 11/20/2020   10:52 AM 11/20/2020    9:00 AM  CBC EXTENDED  WBC 4.0 - 10.5 K/uL 6.1  9.0    RBC 3.87 - 5.11 MIL/uL 4.38  4.27    Hemoglobin 12.0 - 15.0 g/dL 88.3  87.3  86.6   HCT 36.0 - 46.0 % 36.6  38.2  39.0   Platelets 150 - 400 K/uL 427  337    NEUT# 1.7 - 7.7 K/uL 2.9  8.0    Lymph# 0.7 - 4.0 K/uL 2.5  0.6       There is no height or weight on file to calculate BMI.  Orders:  Orders Placed This Encounter  Procedures   AMB referral to sports medicine   No orders of the defined types were placed in this encounter.    Procedures: No procedures performed  Clinical Data: No additional findings.  ROS:  All other systems negative, except as noted in the HPI. Review of Systems  Objective: Vital Signs: There were no vitals taken for this visit.  Specialty Comments:  No specialty comments available.  PMFS History: Patient Active Problem List   Diagnosis Date Noted   Rheumatoid arthritis (HCC) 03/13/2018   Lymphadenopathy, axillary 03/13/2018   FUO (fever of unknown origin) 03/13/2018   Normocytic anemia 03/13/2018   Elevated liver enzymes 03/13/2018   Past Medical History:  Diagnosis Date   Mycosis fungoides (HCC)    RA (rheumatoid arthritis) (HCC)     Family History  Family history unknown: Yes    Past Surgical History:  Procedure Laterality Date   AXILLARY LYMPH NODE BIOPSY Right 03/13/2018   Procedure: AXILLARY LYMPH NODE BIOPSY;  Surgeon: Gladis Cough, MD;  Location: [facility];  Service: General;  Laterality: Right;   Social History   Occupational History   Not on file  Tobacco Use   Smoking status: Never   Smokeless tobacco: Never  Vaping Use   Vaping status: Never Used  Substance and Sexual Activity   Alcohol  use: No   Drug use: No   Sexual activity: Never

## 2024-01-01 ENCOUNTER — Other Ambulatory Visit (HOSPITAL_COMMUNITY): Payer: Self-pay

## 2024-01-06 ENCOUNTER — Other Ambulatory Visit (HOSPITAL_COMMUNITY): Payer: Self-pay

## 2024-01-06 DIAGNOSIS — C8400 Mycosis fungoides, unspecified site: Secondary | ICD-10-CM | POA: Diagnosis not present

## 2024-01-06 DIAGNOSIS — Z79899 Other long term (current) drug therapy: Secondary | ICD-10-CM | POA: Diagnosis not present

## 2024-01-06 DIAGNOSIS — M0579 Rheumatoid arthritis with rheumatoid factor of multiple sites without organ or systems involvement: Secondary | ICD-10-CM | POA: Diagnosis not present

## 2024-01-06 DIAGNOSIS — M059 Rheumatoid arthritis with rheumatoid factor, unspecified: Secondary | ICD-10-CM | POA: Diagnosis not present

## 2024-01-06 MED ORDER — PREDNISONE 5 MG PO TABS
ORAL_TABLET | ORAL | 0 refills | Status: AC
  Filled 2024-01-06: qty 30, 15d supply, fill #0

## 2024-01-07 ENCOUNTER — Ambulatory Visit: Admitting: Sports Medicine

## 2024-01-07 ENCOUNTER — Encounter: Payer: Self-pay | Admitting: Sports Medicine

## 2024-01-07 DIAGNOSIS — M2141 Flat foot [pes planus] (acquired), right foot: Secondary | ICD-10-CM | POA: Diagnosis not present

## 2024-01-07 DIAGNOSIS — M2142 Flat foot [pes planus] (acquired), left foot: Secondary | ICD-10-CM

## 2024-01-07 DIAGNOSIS — M0579 Rheumatoid arthritis with rheumatoid factor of multiple sites without organ or systems involvement: Secondary | ICD-10-CM | POA: Diagnosis not present

## 2024-01-07 DIAGNOSIS — M722 Plantar fascial fibromatosis: Secondary | ICD-10-CM | POA: Diagnosis not present

## 2024-01-07 NOTE — Progress Notes (Signed)
 Patient says that she has had pain in both feet for about 4 months that has not improved with OTC medications, ice, or heat. She points to the arch of the foot, along the bottom of the heel, and up the back of the heel when describing her pain. She says that her pain is at its worst in the mornings when she first gets up, and when she takes her first steps after sitting for awhile. She is here for shockwave therapy today.

## 2024-01-07 NOTE — Progress Notes (Addendum)
 Erin Clay - 43 y.o. female MRN [id number]  Date of birth: [birth year]  Office Visit Note: Visit Date: 01/07/2024 PCP: Pcp, No Referred by: Gerome Maurilio HERO, PA-C  Subjective: Chief Complaint  Patient presents with   Right Foot - Pain   Left Foot - Pain   HPI: Erin Clay is a pleasant 43 y.o. female who presents today for chronic bilateral heel / plantar fasciitis pain. She works at [employer] here for [employer].  Erin Clay states that she has had pain in both feet for about 4 months that has not improved with OTC medications, ice, or heat. She points to the arch of the foot, along the bottom of the heel, and up the back of the heel when describing her pain. She says that her pain is at its worst in the mornings when she first gets up, and when she takes her first steps after sitting for awhile. She is here to discuss shockwave therapy. Has not used orthotics in the past. Uses ibuprofen  as needed.   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  Pertinent ROS were reviewed with the patient and found to be negative unless otherwise specified above in HPI.   Assessment & Plan: Visit Diagnoses:  1. Plantar fasciitis, bilateral   2. Pes planus of both feet   3. Rheumatoid arthritis with rheumatoid factor of multiple sites without organ or systems involvement (HCC)    Plan: Impression is acute on chronic bilateral plantar fasciitis in the setting of functional pes planus.  She also has underlying rheumatoid arthritis and is currently in a flare which is likely exacerbating.  She has been giving stretching and exercises to improve heel cord/Achilles mobility as well as strength in the plantar fascia, she will continue these on a daily basis.  We did perform extracorporeal shockwave trial for both the right and left plantar fascia today.  I would like to see her back  at least 1 or 2 additional times to see what sort of cumulative benefit she is receiving.  I do think she would benefit from orthotics, I did provide her some information with over-the-counter orthotics she may purchase, she was agreeable and appreciative.  She is starting her 15-day prednisone  5mg  taper today, will complete full course x 15 days then discontinue through rheum.  I will see her back over the next 1-2 weeks for repeat shockwave and further evaluation.  Follow-up: Return for f/u in 10-14 days for bilateral plantar fascia (reg visit).   Meds & Orders: No orders of the defined types were placed in this encounter.  No orders of the defined types were placed in this encounter.    Procedures: Procedure: ECSWT Indications: Plantar fasciitis   Procedure Details Consent: Risks of procedure as well as the alternatives and risks of each were explained to the patient.  Verbal consent for procedure obtained. Time Out: Verified patient identification, verified procedure, site was marked, verified correct patient position. The area was cleaned with alcohol swab.     The right and left plantar fascia was targeted for Extracorporeal shockwave therapy.    Preset: Plantar fasciitis Power Level: 110 mJ Frequency: 12-13 Hz Impulse/cycles: 2400 (bilateral) Head size: Regular   Patient tolerated procedure well without immediate complications.         Clinical History: No specialty comments available.  She reports that she has never smoked. She  has never used smokeless tobacco. No results for input(s): HGBA1C, LABURIC in the last 8760 hours.  Objective:    Physical Exam  Gen: Well-appearing, in no acute distress; non-toxic CV: Well-perfused. Warm.  Resp: Breathing unlabored on room air; no wheezing. Psych: Fluid speech in conversation; appropriate affect; normal thought process  Ortho Exam - Bilateral feet: + TTP over the medial band of the plantar fascia near the insertion of  the left > right foot bilaterally.  She does have mild to moderate pes planus with a degree of hyperpronation collapse upon standing barefoot.  There is a degree of Achilles contracture bilaterally.  Imaging: No results found.  Past Medical/Family/Surgical/Social History: Medications & Allergies reviewed per EMR, new medications updated. Patient Active Problem List   Diagnosis Date Noted   Rheumatoid arthritis (HCC) 03/13/2018   Lymphadenopathy, axillary 03/13/2018   FUO (fever of unknown origin) 03/13/2018   Normocytic anemia 03/13/2018   Elevated liver enzymes 03/13/2018   Past Medical History:  Diagnosis Date   Mycosis fungoides (HCC)    RA (rheumatoid arthritis) (HCC)    Family History  Family history unknown: Yes   Past Surgical History:  Procedure Laterality Date   AXILLARY LYMPH NODE BIOPSY Right 03/13/2018   Procedure: AXILLARY LYMPH NODE BIOPSY;  Surgeon: Gladis Cough, MD;  Location: [facility];  Service: General;  Laterality: Right;   Social History   Occupational History   Not on file  Tobacco Use   Smoking status: Never   Smokeless tobacco: Never  Vaping Use   Vaping status: Never Used  Substance and Sexual Activity   Alcohol use: No   Drug use: No   Sexual activity: Never

## 2024-01-19 ENCOUNTER — Encounter: Payer: Self-pay | Admitting: Sports Medicine

## 2024-01-19 ENCOUNTER — Ambulatory Visit: Admitting: Sports Medicine

## 2024-01-19 DIAGNOSIS — M722 Plantar fascial fibromatosis: Secondary | ICD-10-CM | POA: Diagnosis not present

## 2024-01-19 DIAGNOSIS — M0579 Rheumatoid arthritis with rheumatoid factor of multiple sites without organ or systems involvement: Secondary | ICD-10-CM | POA: Diagnosis not present

## 2024-01-19 DIAGNOSIS — M2142 Flat foot [pes planus] (acquired), left foot: Secondary | ICD-10-CM

## 2024-01-19 DIAGNOSIS — M2141 Flat foot [pes planus] (acquired), right foot: Secondary | ICD-10-CM

## 2024-01-19 NOTE — Progress Notes (Signed)
 Patient says that she still has pain, but both feet do feel about 60% better. She says that she has also been taking Prednisone , and has two more days of that course. She denies having any significant soreness or tenderness from the first shockwave treatment, and is here for repeat treatment today.

## 2024-01-19 NOTE — Progress Notes (Signed)
 "  Erin Clay - 43 y.o. female MRN [id number]  Date of birth: Apr 01, 1980  Office Visit Note: Visit Date: 01/19/2024 PCP: Pcp, No Referred by: No ref. provider found  Subjective: Chief Complaint  Patient presents with   Right Foot - Follow-up   Left Foot - Follow-up   HPI: Erin Clay is a pleasant 43 y.o. female who presents today for follow-up of chronic bilateral heel / plantar fasciitis pain   Areej states that she still has pain, but both feet do feel about 60% better. She says that she has also been taking Prednisone  for her rheumatoid conditions as well, and has two more days of that course. She denies having any significant soreness or tenderness from the first shockwave treatment, and is here for repeat treatment today.  She is using ibuprofen  only on days when she works shifts as a [employer].  She was able to purchase the over-the-counter orthotics, does wear these in her work shoes.  Finds them supportive.   Pertinent ROS were reviewed with the patient and found to be negative unless otherwise specified above in HPI.   Assessment & Plan: Visit Diagnoses:  1. Plantar fasciitis, bilateral   2. Pes planus of both feet   3. Rheumatoid arthritis with rheumatoid factor of multiple sites without organ or systems involvement (HCC)    Plan: Impression is chronic but improving bilateral plantar fasciitis in the setting of acquired pes planus foot shape.  She is in a current flare of her RA and is tapering off her oral prednisone  5mg , she will continue this for 2 additional days and then discontinue.  Did repeat our trial of extracorporeal shockwave therapy, patient tolerated well.  I would like her to continue in her orthotics with her shoes to help support the longitudinal arch and offload the plantar fascia.  She will bring these to her office visit so I can evaluate her in the knees at follow-up.  Discussed the importance of continuing her home PT exercises for both stretching and  support of her plantar fascia and underlying arch.  I will see her back for a final trial of extracorporeal shockwave therapy and then at that point we will see what sort of cumulative benefit before deciding on additional shockwave only visits or additional treatment.  We did discuss the role for plantar fascia injection, but given her improvement we will hold on this for now.  Follow-up: Return for f/u in 2-3 weeks for bilateral PF (last reg visit).   Meds & Orders: No orders of the defined types were placed in this encounter.  No orders of the defined types were placed in this encounter.    Procedures: Procedure: ECSWT Indications: Plantar fasciitis   Procedure Details Consent: Risks of procedure as well as the alternatives and risks of each were explained to the patient.  Verbal consent for procedure obtained. Time Out: Verified patient identification, verified procedure, site was marked, verified correct patient position. The area was cleaned with alcohol swab.     The right and left plantar fascia was targeted for Extracorporeal shockwave therapy.    Preset: Plantar fasciitis Power Level: 110 mJ Frequency: 13 Hz Impulse/cycles: 2400 (bilateral) Head size: Regular   Patient tolerated procedure well without immediate complications.      Clinical History: No specialty comments available.  She reports that she has never smoked. She has never used smokeless tobacco. No results for input(s): HGBA1C, LABURIC in the last 8760 hours.  Objective:    Physical Exam  Gen: Well-appearing, in no acute distress; non-toxic CV: Well-perfused. Warm.  Resp: Breathing unlabored on room air; no wheezing. Psych: Fluid speech in conversation; appropriate affect; normal thought process  Ortho Exam - Bilateral feet: + TTP over the medial band of the plantar fascia, left greater than right.  There is no redness swelling or effusion here.  There is notable pes planus noted with loss of  longitudinal arch.  Imaging: No results found.  Past Medical/Family/Surgical/Social History: Medications & Allergies reviewed per EMR, new medications updated. Patient Active Problem List   Diagnosis Date Noted   Rheumatoid arthritis (HCC) 03/13/2018   Lymphadenopathy, axillary 03/13/2018   FUO (fever of unknown origin) 03/13/2018   Normocytic anemia 03/13/2018   Elevated liver enzymes 03/13/2018   Past Medical History:  Diagnosis Date   Mycosis fungoides (HCC)    RA (rheumatoid arthritis) (HCC)    Family History  Family history unknown: Yes   Past Surgical History:  Procedure Laterality Date   AXILLARY LYMPH NODE BIOPSY Right 03/13/2018   Procedure: AXILLARY LYMPH NODE BIOPSY;  Surgeon: Gladis Cough, MD;  Location: [facility];  Service: General;  Laterality: Right;   Social History   Occupational History   Not on file  Tobacco Use   Smoking status: Never   Smokeless tobacco: Never  Vaping Use   Vaping status: Never Used  Substance and Sexual Activity   Alcohol use: No   Drug use: No   Sexual activity: Never   "

## 2024-01-27 ENCOUNTER — Other Ambulatory Visit (HOSPITAL_COMMUNITY): Payer: Self-pay

## 2024-02-06 ENCOUNTER — Encounter: Payer: Self-pay | Admitting: Sports Medicine

## 2024-02-06 ENCOUNTER — Ambulatory Visit: Admitting: Sports Medicine

## 2024-02-06 DIAGNOSIS — M2142 Flat foot [pes planus] (acquired), left foot: Secondary | ICD-10-CM | POA: Diagnosis not present

## 2024-02-06 DIAGNOSIS — M2141 Flat foot [pes planus] (acquired), right foot: Secondary | ICD-10-CM | POA: Diagnosis not present

## 2024-02-06 DIAGNOSIS — M722 Plantar fascial fibromatosis: Secondary | ICD-10-CM

## 2024-02-06 NOTE — Progress Notes (Addendum)
 "  Erin Clay - 44 y.o. female MRN [id number]  Date of birth: May 03, 1980  Office Visit Note: Visit Date: 02/06/2024 PCP: Pcp, No Referred by: No ref. provider found  Subjective: Chief Complaint  Patient presents with   Right Foot - Follow-up   Left Foot - Follow-up   HPI: Erin Clay is a pleasant 44 y.o. female who presents today for follow-up of chronic bilateral heel/plantar fascia pain syndrome.  We have performed 2 total treatments of extracorporeal shockwave therapy, she has made good improvements from this and feels quite beneficial. Continues with intermittent ibuprofen  use when she works [employer] shifts and is on her feet more.  She has completely tapered off of her oral prednisone  taper which she was taking for an RA flare - she did note the heels felt better when on this.  She is continuing her home PT exercises and wearing arch supports in her shoes.  Pertinent ROS were reviewed with the patient and found to be negative unless otherwise specified above in HPI.   Assessment & Plan: Visit Diagnoses:  1. Plantar fasciitis, bilateral   2. Pes planus of both feet    Plan: Impression is chronic but improving bilateral plantar fasciitis in the setting of pes planus foot shape.  She has made good improvements after 2 treatment trials of extracorporeal shockwave therapy, we did repeat this today.  She did bring in her orthotics which do help support her pes planus and prevent hyperpronation.  She will continue these in her regular tennis shoes.  She will continue with her daily stretches and rehab exercises, discussed importance of remaining consistent with these.  She will continue with her frozen roller and ibuprofen  needed.  We did discuss next steps depending on improvement.  We will see how she does with the above over the next month and then she will update us  in terms of her improvement.  If continue to make improvement, she could consider a few additional shockwave only visits, we  did discuss the out-of-pocket cost with this.  If she is not largely improved, we could consider plantar fascia injections as well, would likely start with 1 foot at a time.  She will update me in about 1 month.  Follow-up: Return for Update me in 1 month.   Meds & Orders: No orders of the defined types were placed in this encounter.  No orders of the defined types were placed in this encounter.    Procedures: Procedure: ECSWT Indications: Plantar fasciitis   Procedure Details Consent: Risks of procedure as well as the alternatives and risks of each were explained to the patient.  Verbal consent for procedure obtained. Time Out: Verified patient identification, verified procedure, site was marked, verified correct patient position. The area was cleaned with alcohol swab.     The right and left plantar fascia was targeted for Extracorporeal shockwave therapy.    Preset: Plantar fasciitis Power Level: 110 mJ Frequency: 13 Hz Impulse/cycles: 2400 (bilateral) Head size: Regular   Patient tolerated procedure well without immediate complications.       Clinical History: No specialty comments available.  She reports that she has never smoked. She has never used smokeless tobacco. No results for input(s): HGBA1C, LABURIC in the last 8760 hours.  Objective:    Physical Exam  Gen: Well-appearing, in no acute distress; non-toxic CV: Well-perfused. Warm.  Resp: Breathing unlabored on room air; no wheezing. Psych: Fluid speech in conversation; appropriate affect; normal thought process  Ortho Exam - Bilateral  feet: Pes planus noted with longitudinal arch loss.  No redness swelling or effusion of the ankle or foot. + Mild TTP at the at the medial band of the plantar fascia left > right.  Imaging: No results found.  Past Medical/Family/Surgical/Social History: Medications & Allergies reviewed per EMR, new medications updated. Patient Active Problem List   Diagnosis Date Noted    Rheumatoid arthritis (HCC) 03/13/2018   Lymphadenopathy, axillary 03/13/2018   FUO (fever of unknown origin) 03/13/2018   Normocytic anemia 03/13/2018   Elevated liver enzymes 03/13/2018   Past Medical History:  Diagnosis Date   Mycosis fungoides (HCC)    RA (rheumatoid arthritis) (HCC)    Family History  Family history unknown: Yes   Past Surgical History:  Procedure Laterality Date   AXILLARY LYMPH NODE BIOPSY Right 03/13/2018   Procedure: AXILLARY LYMPH NODE BIOPSY;  Surgeon: Gladis Cough, MD;  Location: [facility];  Service: General;  Laterality: Right;   Social History   Occupational History   Not on file  Tobacco Use   Smoking status: Never   Smokeless tobacco: Never  Vaping Use   Vaping status: Never Used  Substance and Sexual Activity   Alcohol use: No   Drug use: No   Sexual activity: Never   "

## 2024-02-06 NOTE — Progress Notes (Signed)
 Patient says that her feet are still bothersome, but are overall improving with each shockwave treatment. She does her stretches consistently which help, and brought some of the things she uses at home with her today. She is here for her third treatment of shockwave therapy.

## 2024-08-04 ENCOUNTER — Ambulatory Visit: Admitting: Physician Assistant
